# Patient Record
Sex: Male | Born: 1935 | Race: Black or African American | Hispanic: No | Marital: Married | Smoking: Former smoker
Health system: Southern US, Community
[De-identification: ages and names within clinical notes are randomized; demographics above are authoritative.]

## PROBLEM LIST (undated history)

## (undated) DIAGNOSIS — I471 Supraventricular tachycardia, unspecified: Secondary | ICD-10-CM

## (undated) DIAGNOSIS — I10 Essential (primary) hypertension: Secondary | ICD-10-CM

## (undated) DIAGNOSIS — I6529 Occlusion and stenosis of unspecified carotid artery: Secondary | ICD-10-CM

## (undated) DIAGNOSIS — C8330 Diffuse large B-cell lymphoma, unspecified site: Secondary | ICD-10-CM

## (undated) DIAGNOSIS — IMO0001 Reserved for inherently not codable concepts without codable children: Secondary | ICD-10-CM

## (undated) DIAGNOSIS — IMO0002 Reserved for concepts with insufficient information to code with codable children: Secondary | ICD-10-CM

## (undated) DIAGNOSIS — E0590 Thyrotoxicosis, unspecified without thyrotoxic crisis or storm: Secondary | ICD-10-CM

## (undated) DIAGNOSIS — C801 Malignant (primary) neoplasm, unspecified: Secondary | ICD-10-CM

## (undated) DIAGNOSIS — Z923 Personal history of irradiation: Secondary | ICD-10-CM

## (undated) DIAGNOSIS — Z9289 Personal history of other medical treatment: Secondary | ICD-10-CM

## (undated) HISTORY — DX: Diffuse large B-cell lymphoma, unspecified site: C83.30

## (undated) HISTORY — DX: Personal history of other medical treatment: Z92.89

## (undated) HISTORY — DX: Thyrotoxicosis, unspecified without thyrotoxic crisis or storm: E05.90

## (undated) HISTORY — DX: Reserved for concepts with insufficient information to code with codable children: IMO0002

## (undated) HISTORY — PX: OTHER SURGICAL HISTORY: SHX169

## (undated) HISTORY — DX: Reserved for inherently not codable concepts without codable children: IMO0001

## (undated) HISTORY — DX: Essential (primary) hypertension: I10

## (undated) HISTORY — DX: Supraventricular tachycardia, unspecified: I47.10

## (undated) HISTORY — DX: Occlusion and stenosis of unspecified carotid artery: I65.29

## (undated) HISTORY — DX: Supraventricular tachycardia: I47.1

---

## 1998-01-14 ENCOUNTER — Encounter: Admission: RE | Admit: 1998-01-14 | Discharge: 1998-01-14 | Payer: Self-pay | Admitting: Family Medicine

## 1998-01-28 ENCOUNTER — Encounter: Admission: RE | Admit: 1998-01-28 | Discharge: 1998-01-28 | Payer: Self-pay | Admitting: Family Medicine

## 1998-03-18 ENCOUNTER — Encounter: Admission: RE | Admit: 1998-03-18 | Discharge: 1998-03-18 | Payer: Self-pay | Admitting: Family Medicine

## 1998-04-08 ENCOUNTER — Encounter: Admission: RE | Admit: 1998-04-08 | Discharge: 1998-04-08 | Payer: Self-pay | Admitting: Family Medicine

## 1998-04-29 ENCOUNTER — Encounter: Admission: RE | Admit: 1998-04-29 | Discharge: 1998-04-29 | Payer: Self-pay | Admitting: Family Medicine

## 1998-07-17 ENCOUNTER — Encounter: Admission: RE | Admit: 1998-07-17 | Discharge: 1998-07-17 | Payer: Self-pay | Admitting: Family Medicine

## 1998-07-30 ENCOUNTER — Encounter: Admission: RE | Admit: 1998-07-30 | Discharge: 1998-10-28 | Payer: Self-pay | Admitting: Radiation Oncology

## 1998-08-05 ENCOUNTER — Encounter: Admission: RE | Admit: 1998-08-05 | Discharge: 1998-08-05 | Payer: Self-pay | Admitting: Family Medicine

## 1998-09-19 ENCOUNTER — Encounter: Admission: RE | Admit: 1998-09-19 | Discharge: 1998-09-19 | Payer: Self-pay | Admitting: Family Medicine

## 1998-10-03 ENCOUNTER — Inpatient Hospital Stay (HOSPITAL_COMMUNITY): Admission: AD | Admit: 1998-10-03 | Discharge: 1998-10-07 | Payer: Self-pay | Admitting: Sports Medicine

## 1998-10-03 ENCOUNTER — Encounter: Admission: RE | Admit: 1998-10-03 | Discharge: 1998-10-03 | Payer: Self-pay | Admitting: Sports Medicine

## 1998-10-05 ENCOUNTER — Encounter: Payer: Self-pay | Admitting: Sports Medicine

## 1998-10-14 ENCOUNTER — Encounter: Admission: RE | Admit: 1998-10-14 | Discharge: 1998-10-14 | Payer: Self-pay | Admitting: Family Medicine

## 1998-10-14 ENCOUNTER — Encounter: Payer: Self-pay | Admitting: Family Medicine

## 1998-10-14 ENCOUNTER — Emergency Department (HOSPITAL_COMMUNITY): Admission: EM | Admit: 1998-10-14 | Discharge: 1998-10-15 | Payer: Self-pay | Admitting: Emergency Medicine

## 1998-10-15 ENCOUNTER — Encounter: Payer: Self-pay | Admitting: Family Medicine

## 1998-10-18 ENCOUNTER — Encounter: Admission: RE | Admit: 1998-10-18 | Discharge: 1998-10-18 | Payer: Self-pay | Admitting: Family Medicine

## 1998-11-11 ENCOUNTER — Encounter: Admission: RE | Admit: 1998-11-11 | Discharge: 1998-11-11 | Payer: Self-pay | Admitting: Family Medicine

## 1999-02-07 ENCOUNTER — Encounter: Admission: RE | Admit: 1999-02-07 | Discharge: 1999-05-08 | Payer: Self-pay | Admitting: Radiation Oncology

## 1999-05-02 ENCOUNTER — Encounter: Admission: RE | Admit: 1999-05-02 | Discharge: 1999-05-02 | Payer: Self-pay | Admitting: Family Medicine

## 1999-06-02 ENCOUNTER — Encounter: Admission: RE | Admit: 1999-06-02 | Discharge: 1999-06-02 | Payer: Self-pay | Admitting: Family Medicine

## 1999-06-27 ENCOUNTER — Encounter: Payer: Self-pay | Admitting: *Deleted

## 1999-06-27 ENCOUNTER — Ambulatory Visit (HOSPITAL_BASED_OUTPATIENT_CLINIC_OR_DEPARTMENT_OTHER): Admission: RE | Admit: 1999-06-27 | Discharge: 1999-06-27 | Payer: Self-pay | Admitting: *Deleted

## 1999-07-09 ENCOUNTER — Encounter: Admission: RE | Admit: 1999-07-09 | Discharge: 1999-07-09 | Payer: Self-pay | Admitting: Family Medicine

## 1999-07-28 ENCOUNTER — Ambulatory Visit (HOSPITAL_COMMUNITY): Admission: RE | Admit: 1999-07-28 | Discharge: 1999-07-28 | Payer: Self-pay | Admitting: Radiation Oncology

## 1999-07-30 ENCOUNTER — Encounter: Admission: RE | Admit: 1999-07-30 | Discharge: 1999-10-28 | Payer: Self-pay | Admitting: Radiation Oncology

## 1999-09-17 ENCOUNTER — Ambulatory Visit (HOSPITAL_COMMUNITY): Admission: RE | Admit: 1999-09-17 | Discharge: 1999-09-17 | Payer: Self-pay | Admitting: Gastroenterology

## 1999-09-17 ENCOUNTER — Encounter (INDEPENDENT_AMBULATORY_CARE_PROVIDER_SITE_OTHER): Payer: Self-pay | Admitting: Specialist

## 1999-11-17 ENCOUNTER — Encounter: Admission: RE | Admit: 1999-11-17 | Discharge: 1999-11-17 | Payer: Self-pay | Admitting: Family Medicine

## 2000-10-22 ENCOUNTER — Encounter: Payer: Self-pay | Admitting: Emergency Medicine

## 2000-10-22 ENCOUNTER — Inpatient Hospital Stay (HOSPITAL_COMMUNITY): Admission: EM | Admit: 2000-10-22 | Discharge: 2000-10-23 | Payer: Self-pay | Admitting: Emergency Medicine

## 2000-10-25 ENCOUNTER — Ambulatory Visit (HOSPITAL_COMMUNITY): Admission: RE | Admit: 2000-10-25 | Discharge: 2000-10-25 | Payer: Self-pay | Admitting: Cardiology

## 2000-11-05 ENCOUNTER — Encounter: Admission: RE | Admit: 2000-11-05 | Discharge: 2000-11-05 | Payer: Self-pay | Admitting: Family Medicine

## 2000-11-22 ENCOUNTER — Ambulatory Visit (HOSPITAL_COMMUNITY): Admission: RE | Admit: 2000-11-22 | Discharge: 2000-11-22 | Payer: Self-pay | Admitting: Family Medicine

## 2000-11-22 ENCOUNTER — Encounter: Payer: Self-pay | Admitting: Family Medicine

## 2000-11-22 ENCOUNTER — Encounter: Admission: RE | Admit: 2000-11-22 | Discharge: 2000-11-22 | Payer: Self-pay | Admitting: Family Medicine

## 2000-11-22 ENCOUNTER — Encounter: Admission: RE | Admit: 2000-11-22 | Discharge: 2000-11-22 | Payer: Self-pay | Admitting: *Deleted

## 2000-12-06 ENCOUNTER — Encounter: Admission: RE | Admit: 2000-12-06 | Discharge: 2000-12-06 | Payer: Self-pay | Admitting: Family Medicine

## 2001-01-03 ENCOUNTER — Encounter: Admission: RE | Admit: 2001-01-03 | Discharge: 2001-01-03 | Payer: Self-pay | Admitting: Family Medicine

## 2001-01-24 ENCOUNTER — Encounter: Admission: RE | Admit: 2001-01-24 | Discharge: 2001-01-24 | Payer: Self-pay | Admitting: Family Medicine

## 2001-02-21 ENCOUNTER — Encounter: Admission: RE | Admit: 2001-02-21 | Discharge: 2001-02-21 | Payer: Self-pay | Admitting: Family Medicine

## 2001-03-02 ENCOUNTER — Encounter: Admission: RE | Admit: 2001-03-02 | Discharge: 2001-03-02 | Payer: Self-pay | Admitting: Family Medicine

## 2001-03-28 ENCOUNTER — Encounter: Admission: RE | Admit: 2001-03-28 | Discharge: 2001-03-28 | Payer: Self-pay | Admitting: Family Medicine

## 2001-04-11 ENCOUNTER — Encounter: Admission: RE | Admit: 2001-04-11 | Discharge: 2001-04-11 | Payer: Self-pay | Admitting: Family Medicine

## 2001-04-25 ENCOUNTER — Encounter: Admission: RE | Admit: 2001-04-25 | Discharge: 2001-04-25 | Payer: Self-pay | Admitting: Family Medicine

## 2001-05-18 ENCOUNTER — Encounter: Admission: RE | Admit: 2001-05-18 | Discharge: 2001-05-18 | Payer: Self-pay | Admitting: Family Medicine

## 2001-07-25 ENCOUNTER — Encounter: Admission: RE | Admit: 2001-07-25 | Discharge: 2001-07-25 | Payer: Self-pay | Admitting: Family Medicine

## 2001-11-14 ENCOUNTER — Encounter: Admission: RE | Admit: 2001-11-14 | Discharge: 2001-11-14 | Payer: Self-pay | Admitting: Family Medicine

## 2001-11-14 ENCOUNTER — Encounter: Payer: Self-pay | Admitting: Family Medicine

## 2002-01-02 ENCOUNTER — Encounter: Admission: RE | Admit: 2002-01-02 | Discharge: 2002-01-02 | Payer: Self-pay | Admitting: Family Medicine

## 2002-02-13 ENCOUNTER — Encounter: Admission: RE | Admit: 2002-02-13 | Discharge: 2002-05-12 | Payer: Self-pay | Admitting: Neurology

## 2002-07-11 ENCOUNTER — Encounter: Admission: RE | Admit: 2002-07-11 | Discharge: 2002-07-11 | Payer: Self-pay | Admitting: Family Medicine

## 2002-07-31 ENCOUNTER — Encounter: Admission: RE | Admit: 2002-07-31 | Discharge: 2002-07-31 | Payer: Self-pay | Admitting: Family Medicine

## 2003-02-19 ENCOUNTER — Encounter: Admission: RE | Admit: 2003-02-19 | Discharge: 2003-02-19 | Payer: Self-pay | Admitting: Family Medicine

## 2003-07-23 ENCOUNTER — Encounter: Admission: RE | Admit: 2003-07-23 | Discharge: 2003-07-23 | Payer: Self-pay | Admitting: Family Medicine

## 2003-10-22 ENCOUNTER — Ambulatory Visit (HOSPITAL_COMMUNITY): Admission: RE | Admit: 2003-10-22 | Discharge: 2003-10-22 | Payer: Self-pay | Admitting: Gastroenterology

## 2003-11-20 ENCOUNTER — Encounter: Admission: RE | Admit: 2003-11-20 | Discharge: 2003-11-20 | Payer: Self-pay | Admitting: Family Medicine

## 2003-12-31 ENCOUNTER — Encounter: Admission: RE | Admit: 2003-12-31 | Discharge: 2003-12-31 | Payer: Self-pay | Admitting: Family Medicine

## 2004-02-18 ENCOUNTER — Encounter: Admission: RE | Admit: 2004-02-18 | Discharge: 2004-02-18 | Payer: Self-pay | Admitting: Family Medicine

## 2004-02-21 ENCOUNTER — Encounter: Admission: RE | Admit: 2004-02-21 | Discharge: 2004-02-21 | Payer: Self-pay | Admitting: Sports Medicine

## 2004-02-25 ENCOUNTER — Encounter: Admission: RE | Admit: 2004-02-25 | Discharge: 2004-02-25 | Payer: Self-pay | Admitting: Family Medicine

## 2004-02-29 ENCOUNTER — Encounter: Admission: RE | Admit: 2004-02-29 | Discharge: 2004-02-29 | Payer: Self-pay | Admitting: Sports Medicine

## 2004-05-23 ENCOUNTER — Encounter: Admission: RE | Admit: 2004-05-23 | Discharge: 2004-05-23 | Payer: Self-pay | Admitting: Family Medicine

## 2004-06-02 ENCOUNTER — Encounter: Admission: RE | Admit: 2004-06-02 | Discharge: 2004-06-02 | Payer: Self-pay | Admitting: Family Medicine

## 2004-06-02 ENCOUNTER — Ambulatory Visit (HOSPITAL_COMMUNITY): Admission: RE | Admit: 2004-06-02 | Discharge: 2004-06-02 | Payer: Self-pay | Admitting: Family Medicine

## 2004-07-14 ENCOUNTER — Ambulatory Visit: Payer: Self-pay | Admitting: Family Medicine

## 2004-07-30 ENCOUNTER — Ambulatory Visit: Payer: Self-pay | Admitting: Family Medicine

## 2004-08-18 ENCOUNTER — Ambulatory Visit: Payer: Self-pay | Admitting: Family Medicine

## 2004-09-15 ENCOUNTER — Ambulatory Visit: Payer: Self-pay | Admitting: Family Medicine

## 2004-09-15 ENCOUNTER — Encounter: Admission: RE | Admit: 2004-09-15 | Discharge: 2004-09-15 | Payer: Self-pay | Admitting: Family Medicine

## 2005-01-26 ENCOUNTER — Ambulatory Visit (HOSPITAL_COMMUNITY): Admission: RE | Admit: 2005-01-26 | Discharge: 2005-01-26 | Payer: Self-pay | Admitting: Family Medicine

## 2005-01-26 ENCOUNTER — Ambulatory Visit: Payer: Self-pay | Admitting: Sports Medicine

## 2005-02-05 ENCOUNTER — Ambulatory Visit: Payer: Self-pay | Admitting: Sports Medicine

## 2005-02-11 ENCOUNTER — Ambulatory Visit (HOSPITAL_COMMUNITY): Admission: RE | Admit: 2005-02-11 | Discharge: 2005-02-11 | Payer: Self-pay | Admitting: Family Medicine

## 2005-02-11 ENCOUNTER — Encounter: Payer: Self-pay | Admitting: Cardiology

## 2005-02-11 ENCOUNTER — Ambulatory Visit: Payer: Self-pay | Admitting: Cardiology

## 2005-02-13 ENCOUNTER — Ambulatory Visit: Payer: Self-pay | Admitting: Family Medicine

## 2005-02-23 ENCOUNTER — Encounter: Admission: RE | Admit: 2005-02-23 | Discharge: 2005-05-24 | Payer: Self-pay | Admitting: Family Medicine

## 2005-06-08 ENCOUNTER — Ambulatory Visit: Payer: Self-pay | Admitting: Family Medicine

## 2005-10-15 ENCOUNTER — Ambulatory Visit: Payer: Self-pay | Admitting: Family Medicine

## 2005-11-12 DIAGNOSIS — I6529 Occlusion and stenosis of unspecified carotid artery: Secondary | ICD-10-CM

## 2005-11-12 HISTORY — DX: Occlusion and stenosis of unspecified carotid artery: I65.29

## 2005-11-30 ENCOUNTER — Ambulatory Visit: Payer: Self-pay | Admitting: Family Medicine

## 2006-01-25 ENCOUNTER — Ambulatory Visit: Payer: Self-pay | Admitting: Family Medicine

## 2006-02-24 ENCOUNTER — Ambulatory Visit (HOSPITAL_COMMUNITY): Admission: RE | Admit: 2006-02-24 | Discharge: 2006-02-24 | Payer: Self-pay | Admitting: Sports Medicine

## 2006-02-24 DIAGNOSIS — Z9289 Personal history of other medical treatment: Secondary | ICD-10-CM

## 2006-02-24 HISTORY — DX: Personal history of other medical treatment: Z92.89

## 2006-03-15 ENCOUNTER — Ambulatory Visit: Payer: Self-pay | Admitting: Family Medicine

## 2006-04-07 ENCOUNTER — Ambulatory Visit: Payer: Self-pay | Admitting: Family Medicine

## 2006-05-03 ENCOUNTER — Ambulatory Visit: Payer: Self-pay | Admitting: Family Medicine

## 2006-06-28 ENCOUNTER — Ambulatory Visit: Payer: Self-pay | Admitting: Family Medicine

## 2006-08-04 ENCOUNTER — Ambulatory Visit: Payer: Self-pay | Admitting: Family Medicine

## 2006-09-27 ENCOUNTER — Ambulatory Visit: Payer: Self-pay | Admitting: Family Medicine

## 2006-11-01 ENCOUNTER — Ambulatory Visit: Payer: Self-pay | Admitting: Family Medicine

## 2006-12-09 DIAGNOSIS — N200 Calculus of kidney: Secondary | ICD-10-CM | POA: Insufficient documentation

## 2006-12-09 DIAGNOSIS — I739 Peripheral vascular disease, unspecified: Secondary | ICD-10-CM

## 2006-12-09 DIAGNOSIS — C61 Malignant neoplasm of prostate: Secondary | ICD-10-CM

## 2006-12-09 DIAGNOSIS — E7800 Pure hypercholesterolemia, unspecified: Secondary | ICD-10-CM

## 2006-12-09 DIAGNOSIS — I10 Essential (primary) hypertension: Secondary | ICD-10-CM

## 2006-12-09 DIAGNOSIS — E1151 Type 2 diabetes mellitus with diabetic peripheral angiopathy without gangrene: Secondary | ICD-10-CM | POA: Insufficient documentation

## 2006-12-09 DIAGNOSIS — E039 Hypothyroidism, unspecified: Secondary | ICD-10-CM | POA: Insufficient documentation

## 2006-12-09 DIAGNOSIS — H9190 Unspecified hearing loss, unspecified ear: Secondary | ICD-10-CM | POA: Insufficient documentation

## 2006-12-09 DIAGNOSIS — M5382 Other specified dorsopathies, cervical region: Secondary | ICD-10-CM | POA: Insufficient documentation

## 2006-12-09 DIAGNOSIS — I471 Supraventricular tachycardia: Secondary | ICD-10-CM

## 2006-12-09 DIAGNOSIS — F411 Generalized anxiety disorder: Secondary | ICD-10-CM | POA: Insufficient documentation

## 2006-12-15 ENCOUNTER — Telehealth: Payer: Self-pay | Admitting: Family Medicine

## 2006-12-27 ENCOUNTER — Ambulatory Visit: Payer: Self-pay | Admitting: Family Medicine

## 2006-12-27 LAB — CONVERTED CEMR LAB: Hgb A1c MFr Bld: 7.7 %

## 2007-02-03 ENCOUNTER — Encounter: Payer: Self-pay | Admitting: Family Medicine

## 2007-02-28 ENCOUNTER — Encounter: Payer: Self-pay | Admitting: *Deleted

## 2007-03-28 ENCOUNTER — Encounter: Payer: Self-pay | Admitting: Family Medicine

## 2007-04-25 ENCOUNTER — Encounter: Payer: Self-pay | Admitting: Family Medicine

## 2007-05-16 ENCOUNTER — Ambulatory Visit: Payer: Self-pay | Admitting: Family Medicine

## 2007-05-16 LAB — CONVERTED CEMR LAB: Hgb A1c MFr Bld: 7.8 %

## 2007-05-18 LAB — CONVERTED CEMR LAB
BUN: 17 mg/dL (ref 6–23)
CO2: 25 meq/L (ref 19–32)
Calcium: 9.8 mg/dL (ref 8.4–10.5)
Chloride: 98 meq/L (ref 96–112)
Cholesterol: 260 mg/dL — ABNORMAL HIGH (ref 0–200)
Creatinine, Ser: 1.01 mg/dL (ref 0.40–1.50)
Glucose, Bld: 86 mg/dL (ref 70–99)
HDL: 51 mg/dL (ref >39)
LDL Cholesterol: 171 mg/dL — ABNORMAL HIGH (ref 0–99)
Potassium: 3.6 meq/L (ref 3.5–5.3)
Sodium: 138 meq/L (ref 135–145)
TSH: 6.694 microintl units/mL — ABNORMAL HIGH (ref 0.350–5.50)
Total CHOL/HDL Ratio: 5.1
Triglycerides: 189 mg/dL — ABNORMAL HIGH (ref <150)
VLDL: 38 mg/dL (ref 0–40)

## 2007-08-08 ENCOUNTER — Ambulatory Visit: Payer: Self-pay | Admitting: Family Medicine

## 2007-08-23 ENCOUNTER — Encounter: Payer: Self-pay | Admitting: Family Medicine

## 2007-11-14 ENCOUNTER — Ambulatory Visit: Payer: Self-pay | Admitting: Family Medicine

## 2007-11-14 LAB — CONVERTED CEMR LAB: Hgb A1c MFr Bld: 8 %

## 2007-11-18 LAB — CONVERTED CEMR LAB
LDH: 151 units/L (ref 94–250)
TSH: 2.727 microintl units/mL (ref 0.350–5.50)

## 2007-12-26 ENCOUNTER — Ambulatory Visit: Payer: Self-pay | Admitting: Family Medicine

## 2008-01-30 ENCOUNTER — Telehealth (INDEPENDENT_AMBULATORY_CARE_PROVIDER_SITE_OTHER): Payer: Self-pay | Admitting: *Deleted

## 2008-02-01 ENCOUNTER — Encounter: Payer: Self-pay | Admitting: Family Medicine

## 2008-02-01 ENCOUNTER — Ambulatory Visit: Payer: Self-pay | Admitting: Family Medicine

## 2008-02-03 ENCOUNTER — Telehealth: Payer: Self-pay | Admitting: Family Medicine

## 2008-02-03 LAB — CONVERTED CEMR LAB
ALT: 27 units/L (ref 0–53)
AST: 26 units/L (ref 0–37)
Albumin: 4.5 g/dL (ref 3.5–5.2)
Alkaline Phosphatase: 88 units/L (ref 39–117)
BUN: 22 mg/dL (ref 6–23)
CO2: 25 meq/L (ref 19–32)
Calcium: 9.2 mg/dL (ref 8.4–10.5)
Chloride: 100 meq/L (ref 96–112)
Creatinine, Ser: 1.04 mg/dL (ref 0.40–1.50)
Direct LDL: 143 mg/dL — ABNORMAL HIGH
Glucose, Bld: 185 mg/dL — ABNORMAL HIGH (ref 70–99)
Potassium: 3.4 meq/L — ABNORMAL LOW (ref 3.5–5.3)
Sodium: 139 meq/L (ref 135–145)
Total Bilirubin: 0.6 mg/dL (ref 0.3–1.2)
Total Protein: 7.7 g/dL (ref 6.0–8.3)
Vit D, 1,25-Dihydroxy: 24 — ABNORMAL LOW (ref 30–89)

## 2008-02-06 ENCOUNTER — Ambulatory Visit: Payer: Self-pay | Admitting: Family Medicine

## 2008-02-06 DIAGNOSIS — J309 Allergic rhinitis, unspecified: Secondary | ICD-10-CM | POA: Insufficient documentation

## 2008-05-16 ENCOUNTER — Encounter: Payer: Self-pay | Admitting: *Deleted

## 2008-06-11 ENCOUNTER — Ambulatory Visit: Payer: Self-pay | Admitting: Family Medicine

## 2008-06-11 LAB — CONVERTED CEMR LAB: Hgb A1c MFr Bld: 7.1 %

## 2008-07-30 ENCOUNTER — Encounter: Payer: Self-pay | Admitting: *Deleted

## 2008-09-21 ENCOUNTER — Ambulatory Visit: Payer: Self-pay | Admitting: Family Medicine

## 2008-09-21 LAB — CONVERTED CEMR LAB
BUN: 24 mg/dL — ABNORMAL HIGH (ref 6–23)
CO2: 28 meq/L (ref 19–32)
Calcium: 9.6 mg/dL (ref 8.4–10.5)
Chloride: 100 meq/L (ref 96–112)
Creatinine, Ser: 1.02 mg/dL (ref 0.40–1.50)
Direct LDL: 145 mg/dL — ABNORMAL HIGH
Glucose, Bld: 94 mg/dL (ref 70–99)
Hgb A1c MFr Bld: 6.6 %
Potassium: 3.6 meq/L (ref 3.5–5.3)
Sodium: 139 meq/L (ref 135–145)

## 2008-10-10 ENCOUNTER — Telehealth: Payer: Self-pay | Admitting: *Deleted

## 2008-10-12 HISTORY — PX: CARDIAC CATHETERIZATION: SHX172

## 2008-11-07 ENCOUNTER — Telehealth: Payer: Self-pay | Admitting: Family Medicine

## 2008-12-06 ENCOUNTER — Encounter: Payer: Self-pay | Admitting: Family Medicine

## 2008-12-31 ENCOUNTER — Telehealth: Payer: Self-pay | Admitting: Family Medicine

## 2008-12-31 ENCOUNTER — Ambulatory Visit: Payer: Self-pay | Admitting: Family Medicine

## 2009-01-04 ENCOUNTER — Ambulatory Visit: Payer: Self-pay | Admitting: Family Medicine

## 2009-01-04 ENCOUNTER — Encounter: Admission: RE | Admit: 2009-01-04 | Discharge: 2009-01-04 | Payer: Self-pay | Admitting: Family Medicine

## 2009-01-04 DIAGNOSIS — M949 Disorder of cartilage, unspecified: Secondary | ICD-10-CM

## 2009-01-04 DIAGNOSIS — M899 Disorder of bone, unspecified: Secondary | ICD-10-CM | POA: Insufficient documentation

## 2009-01-04 LAB — CONVERTED CEMR LAB
ALT: 18 units/L (ref 0–53)
AST: 27 units/L (ref 0–37)
Albumin: 4 g/dL (ref 3.5–5.2)
Alkaline Phosphatase: 74 units/L (ref 39–117)
BUN: 16 mg/dL (ref 6–23)
CO2: 21 meq/L (ref 19–32)
Calcium: 8.8 mg/dL (ref 8.4–10.5)
Chloride: 101 meq/L (ref 96–112)
Creatinine, Ser: 1.28 mg/dL (ref 0.40–1.50)
Direct LDL: 127 mg/dL — ABNORMAL HIGH
Glucose, Bld: 151 mg/dL — ABNORMAL HIGH (ref 70–99)
Hgb A1c MFr Bld: 6.4 %
Potassium: 3.6 meq/L (ref 3.5–5.3)
Sodium: 138 meq/L (ref 135–145)
Total Bilirubin: 0.5 mg/dL (ref 0.3–1.2)
Total Protein: 7.7 g/dL (ref 6.0–8.3)
Vit D, 25-Hydroxy: 17 ng/mL — ABNORMAL LOW (ref 30–89)

## 2009-01-14 ENCOUNTER — Encounter: Payer: Self-pay | Admitting: Family Medicine

## 2009-03-17 ENCOUNTER — Inpatient Hospital Stay: Payer: Self-pay | Admitting: Internal Medicine

## 2009-03-28 ENCOUNTER — Encounter: Payer: Self-pay | Admitting: Family Medicine

## 2009-05-09 ENCOUNTER — Encounter: Payer: Self-pay | Admitting: Family Medicine

## 2009-05-09 ENCOUNTER — Ambulatory Visit: Payer: Self-pay | Admitting: Family Medicine

## 2009-05-09 DIAGNOSIS — H8110 Benign paroxysmal vertigo, unspecified ear: Secondary | ICD-10-CM

## 2009-05-09 LAB — CONVERTED CEMR LAB: Hgb A1c MFr Bld: 6.6 %

## 2009-05-14 ENCOUNTER — Encounter: Payer: Self-pay | Admitting: Family Medicine

## 2009-05-15 LAB — CONVERTED CEMR LAB
BUN: 18 mg/dL (ref 6–23)
Basophils Absolute: 0 10*3/uL (ref 0.0–0.1)
Basophils Relative: 1 % (ref 0–1)
CO2: 27 meq/L (ref 19–32)
Calcium: 9.2 mg/dL (ref 8.4–10.5)
Chloride: 100 meq/L (ref 96–112)
Creatinine, Ser: 1.04 mg/dL (ref 0.40–1.50)
Eosinophils Absolute: 0 10*3/uL (ref 0.0–0.7)
Eosinophils Relative: 1 % (ref 0–5)
Glucose, Bld: 117 mg/dL — ABNORMAL HIGH (ref 70–99)
HCT: 43.2 % (ref 39.0–52.0)
Hemoglobin: 14.5 g/dL (ref 13.0–17.0)
Lymphocytes Relative: 31 % (ref 12–46)
Lymphs Abs: 2 10*3/uL (ref 0.7–4.0)
MCHC: 33.6 g/dL (ref 30.0–36.0)
MCV: 93.9 fL (ref 78.0–100.0)
Monocytes Absolute: 0.3 10*3/uL (ref 0.1–1.0)
Monocytes Relative: 5 % (ref 3–12)
Neutro Abs: 4.2 10*3/uL (ref 1.7–7.7)
Neutrophils Relative %: 64 % (ref 43–77)
Platelets: 192 10*3/uL (ref 150–400)
Potassium: 3.9 meq/L (ref 3.5–5.3)
RBC: 4.6 M/uL (ref 4.22–5.81)
RDW: 14.8 % (ref 11.5–15.5)
Sodium: 139 meq/L (ref 135–145)
TSH: 2.192 microintl units/mL (ref 0.350–4.500)
WBC: 6.6 10*3/uL (ref 4.0–10.5)

## 2009-05-17 ENCOUNTER — Encounter: Payer: Self-pay | Admitting: Family Medicine

## 2009-05-22 ENCOUNTER — Ambulatory Visit: Payer: Self-pay | Admitting: Family Medicine

## 2009-05-28 ENCOUNTER — Telehealth (INDEPENDENT_AMBULATORY_CARE_PROVIDER_SITE_OTHER): Payer: Self-pay | Admitting: *Deleted

## 2009-06-07 ENCOUNTER — Telehealth (INDEPENDENT_AMBULATORY_CARE_PROVIDER_SITE_OTHER): Payer: Self-pay | Admitting: *Deleted

## 2009-06-14 ENCOUNTER — Ambulatory Visit: Payer: Self-pay | Admitting: Family Medicine

## 2009-06-14 DIAGNOSIS — G252 Other specified forms of tremor: Secondary | ICD-10-CM

## 2009-06-14 DIAGNOSIS — G250 Essential tremor: Secondary | ICD-10-CM

## 2009-07-11 ENCOUNTER — Telehealth: Payer: Self-pay | Admitting: Family Medicine

## 2009-07-31 ENCOUNTER — Ambulatory Visit: Payer: Self-pay | Admitting: Family Medicine

## 2009-07-31 LAB — CONVERTED CEMR LAB: Hgb A1c MFr Bld: 6.7 %

## 2009-08-07 ENCOUNTER — Encounter: Payer: Self-pay | Admitting: Family Medicine

## 2009-11-27 ENCOUNTER — Telehealth: Payer: Self-pay | Admitting: Family Medicine

## 2009-12-18 ENCOUNTER — Ambulatory Visit: Payer: Self-pay | Admitting: Family Medicine

## 2009-12-18 LAB — CONVERTED CEMR LAB: Hgb A1c MFr Bld: 7.6 %

## 2009-12-20 LAB — CONVERTED CEMR LAB
ALT: 20 units/L (ref 0–53)
AST: 23 units/L (ref 0–37)
Albumin: 4.2 g/dL (ref 3.5–5.2)
Alkaline Phosphatase: 74 units/L (ref 39–117)
BUN: 18 mg/dL (ref 6–23)
CO2: 25 meq/L (ref 19–32)
Calcium: 9.5 mg/dL (ref 8.4–10.5)
Chloride: 97 meq/L (ref 96–112)
Cholesterol: 219 mg/dL — ABNORMAL HIGH (ref 0–200)
Creatinine, Ser: 0.88 mg/dL (ref 0.40–1.50)
Glucose, Bld: 88 mg/dL (ref 70–99)
HDL: 49 mg/dL (ref >39)
LDL Cholesterol: 150 mg/dL — ABNORMAL HIGH (ref 0–99)
Potassium: 3.4 meq/L — ABNORMAL LOW (ref 3.5–5.3)
Sodium: 136 meq/L (ref 135–145)
Total Bilirubin: 0.4 mg/dL (ref 0.3–1.2)
Total CHOL/HDL Ratio: 4.5
Total Protein: 7.3 g/dL (ref 6.0–8.3)
Triglycerides: 99 mg/dL (ref <150)
VLDL: 20 mg/dL (ref 0–40)

## 2010-01-28 ENCOUNTER — Ambulatory Visit: Payer: Self-pay | Admitting: Family Medicine

## 2010-02-19 ENCOUNTER — Encounter: Admission: RE | Admit: 2010-02-19 | Discharge: 2010-02-19 | Payer: Self-pay | Admitting: Family Medicine

## 2010-02-19 ENCOUNTER — Ambulatory Visit: Payer: Self-pay | Admitting: Family Medicine

## 2010-02-26 ENCOUNTER — Ambulatory Visit: Payer: Self-pay | Admitting: Family Medicine

## 2010-02-26 LAB — CONVERTED CEMR LAB: Pro B Natriuretic peptide (BNP): 19.4 pg/mL (ref 0.0–100.0)

## 2010-03-05 ENCOUNTER — Ambulatory Visit: Payer: Self-pay | Admitting: Internal Medicine

## 2010-03-05 ENCOUNTER — Ambulatory Visit (HOSPITAL_COMMUNITY): Admission: RE | Admit: 2010-03-05 | Discharge: 2010-03-05 | Payer: Self-pay | Admitting: Family Medicine

## 2010-03-05 ENCOUNTER — Encounter: Payer: Self-pay | Admitting: Family Medicine

## 2010-05-14 ENCOUNTER — Ambulatory Visit: Payer: Self-pay | Admitting: Family Medicine

## 2010-05-14 LAB — CONVERTED CEMR LAB: Hgb A1c MFr Bld: 8 %

## 2010-06-05 ENCOUNTER — Telehealth: Payer: Self-pay | Admitting: Family Medicine

## 2010-06-24 ENCOUNTER — Encounter: Payer: Self-pay | Admitting: Family Medicine

## 2010-06-24 LAB — CONVERTED CEMR LAB
ALT: 20 units/L
AST: 21 units/L
Albumin: 4.2 g/dL
Alkaline Phosphatase: 75 units/L
BUN: 16 mg/dL
CO2: 26 meq/L
Calcium: 9.4 mg/dL
Chloride: 96 meq/L
Creatinine, Ser: 0.95 mg/dL
Glucose, Bld: 136 mg/dL
HCT: 40.2 %
Hemoglobin: 13.6 g/dL
MCHC: 33.8 g/dL
MCV: 91.6 fL
Platelets: 163 10*3/uL
Potassium: 4 meq/L
RBC: 4.39 M/uL
RDW: 14.6 %
Sodium: 136 meq/L
Total Bilirubin: 0.5 mg/dL
Total Protein: 7.7 g/dL
WBC: 7.2 10*3/uL

## 2010-07-11 ENCOUNTER — Telehealth: Payer: Self-pay | Admitting: Family Medicine

## 2010-07-23 ENCOUNTER — Ambulatory Visit: Payer: Self-pay | Admitting: Family Medicine

## 2010-07-23 DIAGNOSIS — B356 Tinea cruris: Secondary | ICD-10-CM

## 2010-07-24 ENCOUNTER — Encounter: Admission: RE | Admit: 2010-07-24 | Discharge: 2010-07-24 | Payer: Self-pay | Admitting: Family Medicine

## 2010-07-24 ENCOUNTER — Telehealth: Payer: Self-pay | Admitting: *Deleted

## 2010-07-28 ENCOUNTER — Encounter: Payer: Self-pay | Admitting: Family Medicine

## 2010-07-29 ENCOUNTER — Ambulatory Visit (HOSPITAL_COMMUNITY): Admission: RE | Admit: 2010-07-29 | Discharge: 2010-07-29 | Payer: Self-pay | Admitting: Family Medicine

## 2010-07-29 ENCOUNTER — Telehealth: Payer: Self-pay | Admitting: Family Medicine

## 2010-08-13 ENCOUNTER — Ambulatory Visit: Payer: Self-pay | Admitting: Family Medicine

## 2010-08-13 LAB — CONVERTED CEMR LAB: Hgb A1c MFr Bld: 7.8 %

## 2010-08-14 ENCOUNTER — Telehealth: Payer: Self-pay | Admitting: Family Medicine

## 2010-08-29 ENCOUNTER — Telehealth: Payer: Self-pay | Admitting: Family Medicine

## 2010-09-22 ENCOUNTER — Ambulatory Visit: Payer: Self-pay | Admitting: Cardiovascular Disease

## 2010-09-24 ENCOUNTER — Telehealth: Payer: Self-pay | Admitting: Family Medicine

## 2010-09-25 ENCOUNTER — Telehealth: Payer: Self-pay | Admitting: Family Medicine

## 2010-09-29 ENCOUNTER — Telehealth: Payer: Self-pay | Admitting: Family Medicine

## 2010-10-08 ENCOUNTER — Telehealth: Payer: Self-pay | Admitting: Family Medicine

## 2010-10-12 DIAGNOSIS — C8330 Diffuse large B-cell lymphoma, unspecified site: Secondary | ICD-10-CM

## 2010-10-12 HISTORY — DX: Diffuse large B-cell lymphoma, unspecified site: C83.30

## 2010-10-17 ENCOUNTER — Ambulatory Visit: Admit: 2010-10-17 | Payer: Self-pay | Admitting: Family Medicine

## 2010-11-10 ENCOUNTER — Encounter: Payer: Self-pay | Admitting: Family Medicine

## 2010-11-10 ENCOUNTER — Ambulatory Visit: Payer: Self-pay | Admitting: Oncology

## 2010-11-11 NOTE — Miscellaneous (Signed)
Summary: Outside labs  Clinical Lists Changes  Observations: Added new observation of PLATELETK/UL: 163 K/uL (06/24/2010 15:39) Added new observation of RDW: 14.6 % (06/24/2010 15:39) Added new observation of MCHC RBC: 33.8 g/dL (16/07/9603 54:09) Added new observation of MCV: 91.6 fL (06/24/2010 15:39) Added new observation of HCT: 40.2 % (06/24/2010 15:39) Added new observation of HGB: 13.6 g/dL (81/19/1478 29:56) Added new observation of RBC M/UL: 4.39 M/uL (06/24/2010 15:39) Added new observation of WBC COUNT: 7.2 10*3/microliter (06/24/2010 15:39) Added new observation of CALCIUM: 9.4 mg/dL (21/30/8657 84:69) Added new observation of ALBUMIN: 4.2 g/dL (62/95/2841 32:44) Added new observation of PROTEIN, TOT: 7.7 g/dL (10/14/7251 66:44) Added new observation of SGPT (ALT): 20 units/L (06/24/2010 15:39) Added new observation of SGOT (AST): 21 units/L (06/24/2010 15:39) Added new observation of ALK PHOS: 75 units/L (06/24/2010 15:39) Added new observation of BILI TOTAL: 0.5 mg/dL (03/47/4259 56:38) Added new observation of CREATININE: 0.95 mg/dL (75/64/3329 51:88) Added new observation of BUN: 16 mg/dL (41/66/0630 16:01) Added new observation of BG RANDOM: 136 mg/dL (09/32/3557 32:20) Added new observation of CO2 PLSM/SER: 26 meq/L (06/24/2010 15:39) Added new observation of CL SERUM: 96 meq/L (06/24/2010 15:39) Added new observation of K SERUM: 4.0 meq/L (06/24/2010 15:39) Added new observation of NA: 136 meq/L (06/24/2010 15:39)

## 2010-11-11 NOTE — Assessment & Plan Note (Signed)
Summary: not better,df   Vital Signs:  Patient profile:   77 year old male Height:      71.5 inches Weight:      222.4 pounds BMI:     30.70 Temp:     97.8 degrees F oral Pulse rate:   65 / minute BP sitting:   145 / 75  (left arm) Cuff size:   regular  Vitals Entered By: Gladstone Pih (Feb 26, 2010 2:00 PM) CC: C/O not much better, inhalor makes him fell worse Is Patient Diabetic? Yes Did you bring your meter with you today? No Pain Assessment Patient in pain? no        Primary Care Provider:  Doralee Albino MD  CC:  C/O not much better and inhalor makes him fell worse.  History of Present Illness: FU dyspnea.  Some better today.  Was concerned about slow improvement.  Reviewed Hx.  Cough, wheeze and dyspnea on exertion.  Note weight gain over past several months.  C/o leg swelling during day.  Had cardiac work up about 1 year ago.  Has not seen cards recently  Habits & Providers  Alcohol-Tobacco-Diet     Tobacco Status: never  Current Medications (verified): 1)  Aspirin 81 Mg  Tbec (Aspirin) .... One Daily 2)  Cozaar 100 Mg Tabs (Losartan Potassium) .... Take 1 Tablet By Mouth Once A Day 3)  Hydrochlorothiazide 25 Mg Tabs (Hydrochlorothiazide) .... Take 1 Tablet By Mouth Once A Day 4)  Lorazepam 0.5 Mg Tabs (Lorazepam) .Marland Kitchen.. 1 Tablet By Mouth Twice A Day 5)  Metformin Hcl 1000 Mg Tabs (Metformin Hcl) .... Take 1 Tablet By Mouth Twice A Day 6)  Synthroid 50 Mcg  Tabs (Levothyroxine Sodium) .... One By Mouth Daily 7)  Triamcinolone Acetonide 0.1 % Oint (Triamcinolone Acetonide) .... Apply 1 A Small Amount To Skin Twice A Day.  Disp 60 Gram Tube 8)  Amlodipine Besylate 10 Mg  Tabs (Amlodipine Besylate) .... Once Daily 9)  Propranolol Hcl 10 Mg Tabs (Propranolol Hcl) .... One By Mouth Three Times A Day (Help For High Blood Pressure and For Tremor) 10)  Proair Hfa 108 (90 Base) Mcg/act Aers (Albuterol Sulfate) .... Two Puffs Four Times Per Day As Needed Wheezing. 11)   Furosemide 20 Mg Tabs (Furosemide) .... Take 1 Tab By Mouth Every Day  Allergies (verified): 1)  Tetracycline Hcl (Tetracycline Hcl) 2)  Amoxicillin (Amoxicillin) 3)  Erythromycin Ethylsuccinate 4)  Sulfamethoxazole (Sulfamethoxazole) 5)  Simvastatin 6)  Zetia (Ezetimibe)  Physical Exam  General:  Well-developed,well-nourished,in no acute distress; alert,appropriate and cooperative throughout examination Neck:  No JVD Lungs:  bibasilar crackles.  Wheeze exp mild right mid lung. Heart:  1/6 SEM no gallop. Extremities:  2+ bilateral edema   Impression & Recommendations:  Problem # 1:  DYSPNEA ON EXERTION (ICD-786.09) While I still think pulm (allergy versus infection) is likely, will investigate cardiac source.  Will call with results. His updated medication list for this problem includes:    Hydrochlorothiazide 25 Mg Tabs (Hydrochlorothiazide) .Marland Kitchen... Take 1 tablet by mouth once a day    Propranolol Hcl 10 Mg Tabs (Propranolol hcl) ..... One by mouth three times a day (help for high blood pressure and for tremor)    Proair Hfa 108 (90 Base) Mcg/act Aers (Albuterol sulfate) .Marland Kitchen..Marland Kitchen Two puffs four times per day as needed wheezing.    Furosemide 20 Mg Tabs (Furosemide) .Marland Kitchen... Take 1 tab by mouth every day  Orders: [facility] [phone]) 2 D Echo (  2 D Echo) [facility]- Est  Level 4 (41324)  Complete Medication List: 1)  Aspirin 81 Mg Tbec (Aspirin) .... One daily 2)  Cozaar 100 Mg Tabs (Losartan potassium) .... Take 1 tablet by mouth once a day 3)  Hydrochlorothiazide 25 Mg Tabs (Hydrochlorothiazide) .... Take 1 tablet by mouth once a day 4)  Lorazepam 0.5 Mg Tabs (Lorazepam) .Marland Kitchen.. 1 tablet by mouth twice a day 5)  Metformin Hcl 1000 Mg Tabs (Metformin hcl) .... Take 1 tablet by mouth twice a day 6)  Synthroid 50 Mcg Tabs (Levothyroxine sodium) .... One by mouth daily 7)  Triamcinolone Acetonide 0.1 % Oint (Triamcinolone acetonide) .... Apply 1 a small amount to skin twice a day.   disp 60 gram tube 8)  Amlodipine Besylate 10 Mg Tabs (Amlodipine besylate) .... Once daily 9)  Propranolol Hcl 10 Mg Tabs (Propranolol hcl) .... One by mouth three times a day (help for high blood pressure and for tremor) 10)  Proair Hfa 108 (90 Base) Mcg/act Aers (Albuterol sulfate) .... Two puffs four times per day as needed wheezing. 11)  Furosemide 20 Mg Tabs (Furosemide) .... Take 1 tab by mouth every day Prescriptions: FUROSEMIDE 20 MG TABS (FUROSEMIDE) Take 1 tab by mouth every day  #90 x 3   Entered and Authorized by:   Doralee Albino MD   Signed by:   Doralee Albino MD on 02/26/2010   Method used:   Electron[facility] to        [facility]  [address] [id number] ([facility]ail[facility]       [address]       [place], [place]  [address]       Ph: [phone] or [phone]       Fax: [phone]   RxID:   [id number]    Prevention & Chronic Care Immunizations   Influenza vaccine: Fluvax MCR  (07/31/2009)   Influenza vaccine due: 06/13/2011    Tetanus booster: 09/21/2008: given   Tetanus booster due: 09/21/2018    Pneumococcal vaccine: Pneumovax  (06/14/2009)   Pneumococcal vaccine due: None    H. zoster vaccine: Not documented  Colorectal Screening   Hemoccult: Done.  (05/13/2003)   Hemoccult due: Not Indicated    Colonoscopy: normal  (01/14/2009)   Colonoscopy due: 01/15/2019  Other Screening   PSA: sees [facility]  (09/21/2008)   PSA due due: Not Indicated   Smoking status: never  (02/26/2010)  Diabetes Mellitus   HgbA1C: 7.6  (12/18/2009)   Hemoglobin A1C due: 02/11/2008    Eye exam: normal  (08/21/2008)   Eye exam due: 08/2009    Foot exam: yes  (06/14/2009)   High risk foot: Not documented   Foot care education: Not documented    Urine microalbumin/creatinine ratio: Not documented   Urine microalbumin action/deferral: Not indicated    Diabetes flowsheet reviewed?: Yes   Progress toward A1C goal: Unchanged  Lipids   Total Cholesterol: 219  (12/18/2009)   Lipid  panel action/deferral: Lipid Panel ordered   LDL: 150  (12/18/2009)   LDL Direct: 127  (01/04/2009)   HDL: 49  (12/18/2009)   Triglycerides: 99  (12/18/2009)    SGOT (AST): 23  (12/18/2009)   SGPT (ALT): 20  (12/18/2009)   Alkaline phosphatase: 74  (12/18/2009)   Total bilirubin: 0.4  (12/18/2009)    Lipid flowsheet reviewed?: Yes   Progress toward LDL goal: Unchanged  Hypertension   Last Blood Pressure: 145 / 75  (02/26/2010)   Serum creatinine: 0.88  (12/18/2009)   Serum  potassium 3.4  (12/18/2009)    Hypertension flowsheet reviewed?: Yes   Progress toward BP goal: Unchanged  Self-Management Support :   Personal Goals (by the next clinic visit) :     Personal A1C goal: 7  (06/14/2009)     Personal blood pressure goal: 130/80  (06/14/2009)     Personal LDL goal: 100  (06/14/2009)    Diabetes self-management support: Written self-care plan  (02/19/2010)    Hypertension self-management support: Written self-care plan  (02/19/2010)    Lipid self-management support: Written self-care plan  (02/19/2010)

## 2010-11-11 NOTE — Assessment & Plan Note (Signed)
Summary: infected splinter,df   Vital Signs:  Patient profile:   77 year old male Height:      71.5 inches Weight:      218.5 pounds Temp:     98.1 degrees F oral Pulse rate:   65 / minute BP sitting:   143 / 93  Vitals Entered By: [place], SMA CC: Pt is wondering if he can have a Tdap after having a splinter in Right thumb. Pain Assessment Patient in pain? yes     Location: Right thumb  Intensity: 3 Onset of pain  yesterday   Primary Care Provider:  Doralee Albino MD  CC:  Pt is wondering if he can have a Tdap after having a splinter in Right thumb.Marland Kitchen  History of Present Illness: Patient he was picking up a plank yesterday evening while cleaning up his yard and a splinter approximately 1 inch long went into his right thumb.  States he immediately pulled splinter out and believes he got all of the splinter out but today noticed pain, swelling and redness to thumb with some decreased ROM.  Reports he had not used anything on area, no associated drainage, numbness or tingling to right hand. 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  Habits & Providers  Alcohol-Tobacco-Diet     Tobacco Status: never  Allergies: 1)  Tetracycline Hcl (Tetracycline Hcl) 2)  Amoxicillin (Amoxicillin) 3)  Erythromycin Ethylsuccinate 4)  Sulfamethoxazole (Sulfamethoxazole) 5)  Simvastatin 6)  Zetia (Ezetimibe)  Past History:  Past Medical History: carotid ultrasound 2/07: Rt nl: Lt<50% stenosis,  ETT OK 02/24/06,  finished external beam radiation to prostate 07/00, gold seeds in prostate 09/00,  thyroid radiation for hyperthyroidism diabetes  Review of Systems General:  Denies chills, fever, and sweats. CV:  Denies chest pain or discomfort, lightheadness, and shortness of breath with exertion. Resp:  Denies chest discomfort, cough, and shortness of breath. Derm:  Complains of changes in color of skin; denies flushing. Neuro:  Denies numbness  and tingling.  Physical Exam  General:  Well-developed,well-nourished,in no acute distress; alert,appropriate and cooperative throughout examination Lungs:  Normal respiratory effort, chest expands symmetrically. Lungs are clear to auscultation, no crackles or wheezes. Heart:  Normal rate and regular rhythm. S1 and S2 normal without gallop, murmur, click, rub or other extra sounds. Msk:  normal ROM.   Pulses:  R radial normal.   Extremities:  Erythematous swollen warm painful dorsal aspect of right thumb extending from 1st PIP joint to tip, painful with palpation, minimal limitation of joint, entry point noted approx 1mm above joint, no drainage, skin intact    Impression & Recommendations:  Problem # 1:  CELLULITIS, FINGER (ICD-681.00) Recheck on Thrusday.  Soak four times per day. Start antibiotics today, instructed to keep area covered while working today.Tetanus is up to date. His updated medication list for this problem includes:    Cephalexin 500 Mg Caps (Cephalexin) .Marland Kitchen... Take one tab by mouth three times a day x 10 days  Complete Medication List: 1)  Aspirin 81 Mg Tbec (Aspirin) .... One daily 2)  Cozaar 100 Mg Tabs (Losartan potassium) .... Take 1 tablet by mouth once a day 3)  Hydrochlorothiazide 25 Mg Tabs (Hydrochlorothiazide) .... Take 1 tablet by mouth once a day 4)  Lorazepam 0.5 Mg Tabs (Lorazepam) .Marland Kitchen.. 1 tablet by mouth twice a day 5)  Metformin Hcl 1000 Mg Tabs (Metformin hcl) .... Take 1 tablet by mouth twice a day 6)  Synthroid  50 Mcg Tabs (Levothyroxine sodium) .... One by mouth daily 7)  Triamcinolone Acetonide 0.1 % Oint (Triamcinolone acetonide) .... Apply 1 a small amount to skin twice a day.  disp 60 gram tube 8)  Amlodipine Besylate 10 Mg Tabs (Amlodipine besylate) .... Once daily 9)  Propranolol Hcl 10 Mg Tabs (Propranolol hcl) .... One by mouth three times a day (help for high blood pressure and for tremor) 10)  Cephalexin 500 Mg Caps (Cephalexin) .... Take  one tab by mouth three times a day x 10 days  Other Orders: [facility]- Est Level  3 ([address]109)  Patient Instructions: 1)  Take antibiotic as prescribed. 2)  Follow up Thursday in Geriatric clinic for recheck. 3)  Warm soaks today four times daily. 4)  Keep area covered while working Prescriptions: CEPHALEXIN 500 MG CAPS (CEPHALEXIN) take one tab by mouth three times a day x 10 days  #30 x 0   Entered and Authorized by:   Luretha Murphy NP   Signed by:   Luretha Murphy NP on 01/28/2010   Method used:   [facility]  [address] [id number]       [address]       [place], [place]  [address]       Ph: [phone] or [phone]       Fax: [phone]   RxID:   [id number] CEPHALEXIN 500 MG CAPS (CEPHALEXIN) take one tab by mouth three times a day x 10 days  #30 x 0   Entered and Authorized by:   Luretha Murphy NP   Signed by:   Luretha Murphy NP on 01/28/2010   Method used:   Electronically to        [facility] [address] [id number] (retail)       [address] [place], [place]  [address]       Ph: [phone]       Fax: [phone]   RxID:   [id number]     Prevention & Chronic Care Immunizations   Influenza vaccine: Fluvax MCR  (07/31/2009)   Influenza vaccine due: 08/07/2008    Tetanus booster: 09/21/2008: given   Tetanus booster due: 09/21/2018    Pneumococcal vaccine: Pneumovax  (06/14/2009)   Pneumococcal vaccine due: None    H. zoster vaccine: Not documented  Colorectal Screening   Hemoccult: Done.  (05/13/2003)   Hemoccult due: Not Indicated    Colonoscopy: normal  (01/14/2009)   Colonoscopy due: 01/15/2019  Other Screening   PSA: sees [facility]  (09/21/2008)   PSA due due: Not Indicated   Smoking status: never  (01/28/2010)  Diabetes Mellitus   HgbA1C: 7.6  (12/18/2009)   Hemoglobin A1C due: 02/11/2008    Eye exam: normal  (08/21/2008)   Eye exam due: 08/2009    Foot exam: yes  (06/14/2009)   High risk foot:  Not documented   Foot care education: Not documented    Urine microalbumin/creatinine ratio: Not documented   Urine microalbumin action/deferral: Not indicated  Lipids   Total Cholesterol: 219  (12/18/2009)   Lipid panel action/deferral: Lipid Panel ordered   LDL: 150  (12/18/2009)   LDL Direct: 127  (01/04/2009)   HDL: 49  (12/18/2009)   Triglycerides: 99  (12/18/2009)    SGOT (AST): 23  (12/18/2009)   SGPT (ALT): 20  (12/18/2009)   Alkaline phosphatase: 74  (12/18/2009)  Total bilirubin: 0.4  (12/18/2009)  Hypertension   Last Blood Pressure: 143 / 93  (01/28/2010)   Serum creatinine: 0.88  (12/18/2009)   Serum potassium 3.4  (12/18/2009)  Self-Management Support :   Personal Goals (by the next clinic visit) :     Personal A1C goal: 7  (06/14/2009)     Personal blood pressure goal: 130/80  (06/14/2009)     Personal LDL goal: 100  (06/14/2009)    Diabetes self-management support: Written self-care plan  (07/31/2009)    Hypertension self-management support: Written self-care plan  (07/31/2009)    Lipid self-management support: Written self-care plan  (07/31/2009)

## 2010-11-11 NOTE — Assessment & Plan Note (Signed)
Summary: f/u [facility] per Dr.Hensel/[facility]   Vital Signs:  Patient profile:   77 year old male Weight:      217.8 pounds Temp:     98.2 degrees F oral Pulse rate:   73 / minute Pulse rhythm:   regular BP sitting:   134 / 85  (right arm) Cuff size:   regular  Vitals Entered By: Loralee Pacas CMA (July 23, 2010 2:21 PM) CC: follow-up visit   Primary Care Byanka Landrus:  Doralee Albino MD  CC:  follow-up visit.  History of Present Illness: Abd discomfort, seen by Dr. Loreta Ave.  Given stool softener and natural medicine.   Groin rash Nerve irritation Left arm.  Hx of C spine problems Pain is intermitant.  Habits & Providers  Alcohol-Tobacco-Diet     Tobacco Status: never     Tobacco Counseling: to remain off tobacco products  Current Medications (verified): 1)  Aspirin 81 Mg  Tbec (Aspirin) .... One Daily 2)  Cozaar 100 Mg Tabs (Losartan Potassium) .... Take 1 Tablet By Mouth Once A Day 3)  Hydrochlorothiazide 25 Mg Tabs (Hydrochlorothiazide) .... Take 1 Tablet By Mouth Once A Day 4)  Lorazepam 0.5 Mg Tabs (Lorazepam) .Marland Kitchen.. 1 Tablet By Mouth Twice A Day 5)  Metformin Hcl 1000 Mg Tabs (Metformin Hcl) .... Take 1 Tablet By Mouth Twice A Day 6)  Synthroid 50 Mcg  Tabs (Levothyroxine Sodium) .... One By Mouth Daily 7)  Triamcinolone Acetonide 0.1 % Oint (Triamcinolone Acetonide) .... Apply 1 A Small Amount To Skin Twice A Day.  Disp 60 Gram Tube 8)  Amlodipine Besylate 10 Mg  Tabs (Amlodipine Besylate) .... Once Daily 9)  Propranolol Hcl 10 Mg Tabs (Propranolol Hcl) .... One By Mouth Three Times A Day (Help For High Blood Pressure and For Tremor) 10)  Proair Hfa 108 (90 Base) Mcg/act Aers (Albuterol Sulfate) .... Two Puffs Four Times Per Day As Needed Wheezing. 11)  Furosemide 20 Mg Tabs (Furosemide) .... Take 1 Tab By Mouth Every Day 12)  Terbinafine Hcl 1 % Crea (Terbinafine Hcl) .... Apply To Affected Area Two Times A Day Disp 30 Gram Tube  Allergies (verified): 1)  Tetracycline Hcl  (Tetracycline Hcl) 2)  Amoxicillin (Amoxicillin) 3)  Erythromycin Ethylsuccinate 4)  Sulfamethoxazole (Sulfamethoxazole) 5)  Simvastatin 6)  Zetia (Ezetimibe)   Prevention & Chronic Care Immunizations   Influenza vaccine: Fluvax MCR  (07/23/2010)   Influenza vaccine due: 06/13/2011    Tetanus booster: 09/21/2008: given   Tetanus booster due: 09/21/2018    Pneumococcal vaccine: Pneumovax  (06/14/2009)   Pneumococcal vaccine due: None    H. zoster vaccine: Not documented  Colorectal Screening   Hemoccult: Done.  (05/13/2003)   Hemoccult due: Not Indicated    Colonoscopy: normal  (01/14/2009)   Colonoscopy due: 01/15/2019  Other Screening   PSA: sees [facility]  (09/21/2008)   PSA due due: Not Indicated   Smoking status: never  (07/23/2010)  Diabetes Mellitus   HgbA1C: 8.0  (05/14/2010)   Hemoglobin A1C due: 02/11/2008    Eye exam: normal  (08/21/2008)   Eye exam due: 08/2009    Foot exam: yes  (07/23/2010)   High risk foot: Not documented   Foot care education: Not documented    Urine microalbumin/creatinine ratio: Not documented   Urine microalbumin action/deferral: Not indicated    Diabetes flowsheet reviewed?: Yes   Progress toward A1C goal: Unchanged  Lipids   Total Cholesterol: 219  (12/18/2009)   Lipid panel action/deferral: Lipid Panel ordered  LDL: 150  (12/18/2009)   LDL Direct: 127  (01/04/2009)   HDL: 49  (12/18/2009)   Triglycerides: 99  (12/18/2009)    SGOT (AST): 23  (12/18/2009)   SGPT (ALT): 20  (12/18/2009)   Alkaline phosphatase: 74  (12/18/2009)   Total bilirubin: 0.4  (12/18/2009)    Lipid flowsheet reviewed?: Yes   Progress toward LDL goal: Unchanged  Hypertension   Last Blood Pressure: 134 / 85  (07/23/2010)   Serum creatinine: 0.88  (12/18/2009)   Serum potassium 3.4  (12/18/2009)    Hypertension flowsheet reviewed?: Yes   Progress toward BP goal: At goal  Self-Management Support :   Personal Goals (by the next clinic  visit) :     Personal A1C goal: 7  (06/14/2009)     Personal blood pressure goal: 130/80  (06/14/2009)     Personal LDL goal: 100  (06/14/2009)    Diabetes self-management support: Written self-care plan  (02/19/2010)    Hypertension self-management support: Written self-care plan  (02/19/2010)    Lipid self-management support: Written self-care plan  (02/19/2010)   Past History:  Past medical, surgical, family and social histories (including risk factors) reviewed, and no changes noted (except as noted below).  Past Medical History: Reviewed history from 01/28/2010 and no changes required. carotid ultrasound 2/07: Rt nl: Lt<50% stenosis,  ETT OK 02/24/06,  finished external beam radiation to prostate 07/00, gold seeds in prostate 09/00,  thyroid radiation for hyperthyroidism diabetes  Past Surgical History: Reviewed history from 12/09/2006 and no changes required. 08/04/06 ldl: 80 direct - 08/20/2006, ABI: 0.85 mild PAD - 02/05/2005, Barium Enema -, cardiac cath 01/00 normal -, cardiolyte no ischemia, EF=43% - 10/26/2000, CT - Head -, CXR -, ECG -, echo EF=55-65% - 02/16/2005, thyroid scan -, US - Abdominal -, Xray - Spine -  Family History: Reviewed history from 12/09/2006 and no changes required. CAD  Social History: Reviewed history from 12/09/2006 and no changes required. smokes 3-4 cigarettes/day quit 10/2000; non drinker; married; Paediatric nurse, owns business; very regular exercise  Physical Exam  General:  Well-developed,well-nourished,in no acute distress; alert,appropriate and cooperative throughout examination Lungs:  Normal respiratory effort, chest expands symmetrically. Lungs are clear to auscultation, no crackles or wheezes. Heart:  Normal rate and regular rhythm. S1 and S2 normal without gallop, murmur, click, rub or other extra sounds. Abdomen:  Bowel sounds positive,abdomen soft  without masses, organomegaly or hernias noted.  Some tenderness in Rt upper  quadrent. Extremities:  no edema Skin:  groin rash typical of tinea cruris  Diabetes Management Exam:    Foot Exam (with socks and/or shoes not present):       Sensory-Pinprick/Light touch:          Left medial foot (L-4): normal          Left dorsal foot (L-5): normal          Left lateral foot (S-1): normal          Right medial foot (L-4): normal          Right dorsal foot (L-5): normal          Right lateral foot (S-1): normal       Sensory-Monofilament:          Left foot: normal          Right foot: normal       Inspection:          Left foot: normal  Right foot: normal       Nails:          Left foot: normal          Right foot: normal   Impression & Recommendations:  Problem # 1:  ABDOMINAL PAIN (ICD-789.00) Atypical but Rt upper quadrent location suggests gall stones. Orders: Ultrasound (Ultrasound) [facility]- Est  Level 4 (84132)  Problem # 2:  CERVICAL SPINE DISORDER, NOS (ICD-723.9) Suspect nerve root irritation at the C spine. Orders: Radiology other (Radiology Other) [facility]- Est  Level 4 (44010)  Problem # 3:  TINEA CRURIS (ICD-110.3)  Orders: [facility]- Est  Level 4 (27253)  Problem # 4:  DIABETES MELLITUS II, UNCOMPLICATED (ICD-250.00)  His updated medication list for this problem includes:    Aspirin 81 Mg Tbec (Aspirin) ..... One daily    Cozaar 100 Mg Tabs (Losartan potassium) .Marland Kitchen... Take 1 tablet by mouth once a day    Metformin Hcl 1000 Mg Tabs (Metformin hcl) .Marland Kitchen... Take 1 tablet by mouth twice a day  Labs Reviewed: Creat: 0.88 (12/18/2009)     Last Eye Exam: normal (08/21/2008) Reviewed HgBA1c results: 8.0 (05/14/2010)  7.6 (12/18/2009)  Orders: [facility]- Est  Level 4 (66440)  Complete Medication List: 1)  Aspirin 81 Mg Tbec (Aspirin) .... One daily 2)  Cozaar 100 Mg Tabs (Losartan potassium) .... Take 1 tablet by mouth once a day 3)  Hydrochlorothiazide 25 Mg Tabs (Hydrochlorothiazide) .... Take 1 tablet by mouth once a day 4)  Lorazepam 0.5  Mg Tabs (Lorazepam) .Marland Kitchen.. 1 tablet by mouth twice a day 5)  Metformin Hcl 1000 Mg Tabs (Metformin hcl) .... Take 1 tablet by mouth twice a day 6)  Synthroid 50 Mcg Tabs (Levothyroxine sodium) .... One by mouth daily 7)  Triamcinolone Acetonide 0.1 % Oint (Triamcinolone acetonide) .... Apply 1 a small amount to skin twice a day.  disp 60 gram tube 8)  Amlodipine Besylate 10 Mg Tabs (Amlodipine besylate) .... Once daily 9)  Propranolol Hcl 10 Mg Tabs (Propranolol hcl) .... One by mouth three times a day (help for high blood pressure and for tremor) 10)  Proair Hfa 108 (90 Base) Mcg/act Aers (Albuterol sulfate) .... Two puffs four times per day as needed wheezing. 11)  Furosemide 20 Mg Tabs (Furosemide) .... Take 1 tab by mouth every day 12)  Terbinafine Hcl 1 % Crea (Terbinafine hcl) .... Apply to affected area two times a day disp 30 gram tube  Other Orders: Influenza Vaccine MCR (34742) Prescriptions: TERBINAFINE HCL 1 % CREA (TERBINAFINE HCL) Apply to affected area two times a day Disp 30 gram tube  #30 x 1   Entered and Authorized by:   Doralee Albino MD   Signed by:   Doralee Albino MD on 07/23/2010   Method used:   [facility]        [facility] [id number]       [address]       [place], [place]  [address]       Ph: [phone] or [phone]       Fax: [phone]   RxID:   [id number]    Immunizations Administered:  Influenza Vaccine # 1:    Vaccine Type: Fluvax MCR    Site: right deltoid    Mfr: [employer]    Dose: 0.5 ml    Route: IM    Given by: Loralee Pacas CMA    Exp. Date: 04/08/2011    Lot #: [id number]  VIS given: 05/06/10 version given July 23, 2010.  Flu Vaccine Consent Questions:    Do you have a history of severe allergic reactions to this vaccine? no    Any prior history of allergic reactions to egg and/or gelatin? no    Do you have a sensitivity to the preservative Thimersol? no    Do you have a past history of  Guillan-Barre Syndrome? no    Do you currently have an acute febrile illness? no    Have you ever had a severe reaction to latex? no    Vaccine information given and explained to patient? yes

## 2010-11-11 NOTE — Progress Notes (Signed)
Summary: phn msg  Phone Note Call from Patient Call back at [phone]   Caller: Patient Summary of Call: needs to talk to Hensel about KETOCONAZOLE 2 % CREAM - not doing what he needs it to and needs to talk to Dr H about what else to do. Initial call taken by: De Nurse,  July 11, 2010 11:00 AM  Follow-up for Phone Call        Called and told will need to see again to diagnose rash.  He will call for appointment. Follow-up by: Doralee Albino MD,  July 11, 2010 1:37 PM     Appended Document: phn msg pt called to sched appt for this prob and first avail is 10/12 do you think he could wait or see someone else pls advise

## 2010-11-11 NOTE — Consult Note (Signed)
Summary: [facility]  [facility]   Imported By: Clydell Hakim 07/09/2010 10:59:34  _____________________________________________________________________  External Attachment:    Type:   Image     Comment:   External Document

## 2010-11-11 NOTE — Progress Notes (Signed)
  Phone Note Call from Patient   Caller: Spouse Call For: [phone] Summary of Call: Vikki Ports called to ask that you call the house as soon as you know the results.  According to the department he went to the results should be in this afternoon.  Vikki Ports  says she need to be there because sometimes he gets confused about things.  There both quite nervous.   Initial call taken by: Abundio Miu,  July 29, 2010 12:11 PM  Follow-up for Phone Call        Given the good news that the CT was normal.  No further testing needed.  On a side note, he has a history of prostate cancer and it has been over one year since he has seen the urologist.  They will make an appointment.  I will get a PSA. Follow-up by: Doralee Albino MD,  July 29, 2010 4:24 PM

## 2010-11-11 NOTE — Progress Notes (Signed)
Summary: refill  Phone Note Refill Request Call back at Work Phone [phone] Message from:  Patient  Refills Requested: Medication #1:  TRIAMCINOLONE ACETONIDE 0.1 % OINT Apply 1 a small amount to skin twice a day.  Disp 60 gram tube   Notes: would [facility]  Initial call taken by: De Nurse,  November 27, 2009 12:00 PM Summary of Call: needs sal  Follow-up for Phone Call        done electronically Follow-up by: Doralee Albino MD,  November 27, 2009 2:59 PM  Additional Follow-up for Phone Call Additional follow up Details #1::        Pt notified Rx at phar Additional Follow-up by: Gladstone Pih,  November 28, 2009 10:42 AM    New/Updated Medications: TRIAMCINOLONE ACETONIDE 0.1 % OINT (TRIAMCINOLONE ACETONIDE) Apply 1 a small amount to skin twice a day.  Disp 60 gram tube Prescriptions: TRIAMCINOLONE ACETONIDE 0.1 % OINT (TRIAMCINOLONE ACETONIDE) Apply 1 a small amount to skin twice a day.  Disp 60 gram tube  #60 x 4   Entered and Authorized by:   Doralee Albino MD   Signed by:   Doralee Albino MD on 11/27/2009   Method used:   [facility]        [facility] [facility]       [address]       [place], [place]  [address]       Ph: [phone] or [phone]       Fax: [phone]   RxID:   [id number]

## 2010-11-11 NOTE — Progress Notes (Signed)
Summary: phn msg  Phone Note Call from Patient Call back at [phone]   Caller: Patient Summary of Call: Would like to talk to Dr. Karleen Dolphin about a salve he gave him about 3 weeks ago. Initial call taken by: Clydell Hakim,  June 05, 2010 12:04 PM  Follow-up for Phone Call        Used up 60 gm tube quickly.  Some improvement but rash not gone.  Will refill and emphasized to use only a small amount. Follow-up by: Doralee Albino MD,  June 06, 2010 9:18 AM    Prescriptions: KETOCONAZOLE 2 % CREA (KETOCONAZOLE) apply two times a day to affected area.  Disp 60 gram tube  #60 x 0   Entered and Authorized by:   Doralee Albino MD   Signed by:   Doralee Albino MD on 06/06/2010   Method used:   [facility]        [facility]  [address] [id number]       [address]       [place], [place]  [address]       Ph: [phone] or [phone]       Fax: [phone]   RxID:   [id number]

## 2010-11-11 NOTE — Assessment & Plan Note (Signed)
Summary: f/u,df   Vital Signs:  Patient profile:   77 year old male Weight:      218.8 pounds Temp:     98.3 degrees F oral Pulse rate:   63 / minute Pulse rhythm:   regular BP sitting:   130 / 80  (left arm) Cuff size:   regular  Vitals Entered By: Loralee Pacas CMA (May 14, 2010 4:04 PM)  Primary Care Provider:  Doralee Albino MD   History of Present Illness: C/O two skin lesions: one on scalp and one on face DM - admits to dietary indiscretion.  Weight loss noted.  He feels he needs more.   Current Medications (verified): 1)  Aspirin 81 Mg  Tbec (Aspirin) .... One Daily 2)  Cozaar 100 Mg Tabs (Losartan Potassium) .... Take 1 Tablet By Mouth Once A Day 3)  Hydrochlorothiazide 25 Mg Tabs (Hydrochlorothiazide) .... Take 1 Tablet By Mouth Once A Day 4)  Lorazepam 0.5 Mg Tabs (Lorazepam) .Marland Kitchen.. 1 Tablet By Mouth Twice A Day 5)  Metformin Hcl 1000 Mg Tabs (Metformin Hcl) .... Take 1 Tablet By Mouth Twice A Day 6)  Synthroid 50 Mcg  Tabs (Levothyroxine Sodium) .... One By Mouth Daily 7)  Triamcinolone Acetonide 0.1 % Oint (Triamcinolone Acetonide) .... Apply 1 A Small Amount To Skin Twice A Day.  Disp 60 Gram Tube 8)  Amlodipine Besylate 10 Mg  Tabs (Amlodipine Besylate) .... Once Daily 9)  Propranolol Hcl 10 Mg Tabs (Propranolol Hcl) .... One By Mouth Three Times A Day (Help For High Blood Pressure and For Tremor) 10)  Proair Hfa 108 (90 Base) Mcg/act Aers (Albuterol Sulfate) .... Two Puffs Four Times Per Day As Needed Wheezing. 11)  Furosemide 20 Mg Tabs (Furosemide) .... Take 1 Tab By Mouth Every Day 12)  Ketoconazole 2 % Crea (Ketoconazole) .... Apply Two Times A Day To Affected Area.  Disp 60 Gram Tube  Allergies (verified): 1)  Tetracycline Hcl (Tetracycline Hcl) 2)  Amoxicillin (Amoxicillin) 3)  Erythromycin Ethylsuccinate 4)  Sulfamethoxazole (Sulfamethoxazole) 5)  Simvastatin 6)  Zetia (Ezetimibe)  Past History:  Past medical, surgical, family and social histories  (including risk factors) reviewed, and no changes noted (except as noted below).  Past Medical History: Reviewed history from 01/28/2010 and no changes required. carotid ultrasound 2/07: Rt nl: Lt<50% stenosis,  ETT OK 02/24/06,  finished external beam radiation to prostate 07/00, gold seeds in prostate 09/00,  thyroid radiation for hyperthyroidism diabetes  Past Surgical History: Reviewed history from 12/09/2006 and no changes required. 08/04/06 ldl: 80 direct - 08/20/2006, ABI: 0.85 mild PAD - 02/05/2005, Barium Enema -, cardiac cath 01/00 normal -, cardiolyte no ischemia, EF=43% - 10/26/2000, CT - Head -, CXR -, ECG -, echo EF=55-65% - 02/16/2005, thyroid scan -, US - Abdominal -, Xray - Spine -  Family History: Reviewed history from 12/09/2006 and no changes required. CAD  Social History: Reviewed history from 12/09/2006 and no changes required. smokes 3-4 cigarettes/day quit 10/2000; non drinker; married; Paediatric nurse, owns business; very regular exercise  Review of Systems       The patient complains of prolonged cough.  The patient denies chest pain, syncope, dyspnea on exertion, and peripheral edema.         Cough was bad in spring, improved, now worse that he is mowing grass.  Seems allergic.  Physical Exam  General:  Well-developed,well-nourished,in no acute distress; alert,appropriate and cooperative throughout examination Lungs:  Normal respiratory effort, chest expands symmetrically. Lungs are clear  to auscultation, no crackles or wheezes. Heart:  Normal rate and regular rhythm. S1 and S2 normal without gallop, murmur, click, rub or other extra sounds. Skin:  Skin tag on face (Rt cheek) and wart on scalp (left occiput) Both frozen without difficulty.   Impression & Recommendations:  Problem # 1:  DIABETES MELLITUS II, UNCOMPLICATED (ICD-250.00) He promises a renewed effort on diet and exercise.  He has done this in the past.  Showed him A1C graph that demonstrates good control  when good diet and exercise. His updated medication list for this problem includes:    Aspirin 81 Mg Tbec (Aspirin) ..... One daily    Cozaar 100 Mg Tabs (Losartan potassium) .Marland Kitchen... Take 1 tablet by mouth once a day    Metformin Hcl 1000 Mg Tabs (Metformin hcl) .Marland Kitchen... Take 1 tablet by mouth twice a day  Orders: A1C-[facility] (54098) [facility]- Est  Level 4 (11914)  Labs Reviewed: Creat: 0.88 (12/18/2009)     Last Eye Exam: normal (08/21/2008) Reviewed HgBA1c results: 8.0 (05/14/2010)  7.6 (12/18/2009)  Problem # 2:  HYPERTENSION, BENIGN SYSTEMIC (ICD-401.1)  At goal His updated medication list for this problem includes:    Cozaar 100 Mg Tabs (Losartan potassium) .Marland Kitchen... Take 1 tablet by mouth once a day    Hydrochlorothiazide 25 Mg Tabs (Hydrochlorothiazide) .Marland Kitchen... Take 1 tablet by mouth once a day    Amlodipine Besylate 10 Mg Tabs (Amlodipine besylate) ..... Once daily    Propranolol Hcl 10 Mg Tabs (Propranolol hcl) ..... One by mouth three times a day (help for high blood pressure and for tremor)    Furosemide 20 Mg Tabs (Furosemide) .Marland Kitchen... Take 1 tab by mouth every day  BP today: 130/80 Prior BP: 145/75 (02/26/2010)  Labs Reviewed: K+: 3.4 (12/18/2009) Creat: : 0.88 (12/18/2009)   Chol: 219 (12/18/2009)   HDL: 49 (12/18/2009)   LDL: 150 (12/18/2009)   TG: 99 (12/18/2009)  Orders: [facility]- Est  Level 4 (99214)  Problem # 3:  BENIGN NEOPLASM OF SCALP AND SKIN OF NECK (ICD-216.4)  Two skin lesions frozen as describe in PE  Orders: [facility]- Est  Level 4 (78295)  Complete Medication List: 1)  Aspirin 81 Mg Tbec (Aspirin) .... One daily 2)  Cozaar 100 Mg Tabs (Losartan potassium) .... Take 1 tablet by mouth once a day 3)  Hydrochlorothiazide 25 Mg Tabs (Hydrochlorothiazide) .... Take 1 tablet by mouth once a day 4)  Lorazepam 0.5 Mg Tabs (Lorazepam) .Marland Kitchen.. 1 tablet by mouth twice a day 5)  Metformin Hcl 1000 Mg Tabs (Metformin hcl) .... Take 1 tablet by mouth twice a day 6)  Synthroid 50 Mcg Tabs  (Levothyroxine sodium) .... One by mouth daily 7)  Triamcinolone Acetonide 0.1 % Oint (Triamcinolone acetonide) .... Apply 1 a small amount to skin twice a day.  disp 60 gram tube 8)  Amlodipine Besylate 10 Mg Tabs (Amlodipine besylate) .... Once daily 9)  Propranolol Hcl 10 Mg Tabs (Propranolol hcl) .... One by mouth three times a day (help for high blood pressure and for tremor) 10)  Proair Hfa 108 (90 Base) Mcg/act Aers (Albuterol sulfate) .... Two puffs four times per day as needed wheezing. 11)  Furosemide 20 Mg Tabs (Furosemide) .... Take 1 tab by mouth every day 12)  Ketoconazole 2 % Crea (Ketoconazole) .... Apply two times a day to affected area.  disp 60 gram tube  Patient Instructions: 1)  Please schedule a follow-up appointment in 3 months .  Prescriptions: KETOCONAZOLE 2 % CREA (  KETOCONAZOLE) apply two times a day to affected area.  Disp 60 gram tube  #60 x 1   Entered and Authorized by:   Doralee Albino MD   Signed by:   Doralee Albino MD on 05/14/2010   Method used:   [facility]        [facility] [id number]       [address]       [place], [place]  [address]       Ph: [phone] or [phone]       Fax: [phone]   RxID:   [id number]   Laboratory Results   Blood Tests   Date/Time Received: May 14, 2010 4:07 PM  Date/Time Reported: May 14, 2010 4:17 PM   HGBA1C: 8.0%   (Normal Range: Non-Diabetic - 3-6%   Control Diabetic - 6-8%)  Comments: ...........test performed by..........Marland KitchenLoralee Pacas, CMA entered by Terese Door, CMA            Prevention & Chronic Care Immunizations   Influenza vaccine: Fluvax MCR  (07/31/2009)   Influenza vaccine due: 06/13/2011    Tetanus booster: 09/21/2008: given   Tetanus booster due: 09/21/2018    Pneumococcal vaccine: Pneumovax  (06/14/2009)   Pneumococcal vaccine due: None    H. zoster vaccine: Not documented  Colorectal Screening   Hemoccult: Done.  (05/13/2003)    Hemoccult due: Not Indicated    Colonoscopy: normal  (01/14/2009)   Colonoscopy due: 01/15/2019  Other Screening   PSA: sees [facility]  (09/21/2008)   PSA due due: Not Indicated   Smoking status: never  (02/26/2010)  Diabetes Mellitus   HgbA1C: 8.0  (05/14/2010)   Hemoglobin A1C due: 02/11/2008    Eye exam: normal  (08/21/2008)   Eye exam due: 08/2009    Foot exam: yes  (06/14/2009)   High risk foot: Not documented   Foot care education: Not documented    Urine microalbumin/creatinine ratio: Not documented   Urine microalbumin action/deferral: Not indicated    Diabetes flowsheet reviewed?: Yes   Progress toward A1C goal: Unchanged  Lipids   Total Cholesterol: 219  (12/18/2009)   Lipid panel action/deferral: Lipid Panel ordered   LDL: 150  (12/18/2009)   LDL Direct: 127  (01/04/2009)   HDL: 49  (12/18/2009)   Triglycerides: 99  (12/18/2009)    SGOT (AST): 23  (12/18/2009)   SGPT (ALT): 20  (12/18/2009)   Alkaline phosphatase: 74  (12/18/2009)   Total bilirubin: 0.4  (12/18/2009)    Lipid flowsheet reviewed?: Yes   Progress toward LDL goal: Unchanged  Hypertension   Last Blood Pressure: 130 / 80  (05/14/2010)   Serum creatinine: 0.88  (12/18/2009)   Serum potassium 3.4  (12/18/2009)    Hypertension flowsheet reviewed?: Yes   Progress toward BP goal: At goal  Self-Management Support :   Personal Goals (by the next clinic visit) :     Personal A1C goal: 7  (06/14/2009)     Personal blood pressure goal: 130/80  (06/14/2009)     Personal LDL goal: 100  (06/14/2009)    Diabetes self-management support: Written self-care plan  (02/19/2010)    Hypertension self-management support: Written self-care plan  (02/19/2010)    Lipid self-management support: Written self-care plan  (02/19/2010)

## 2010-11-11 NOTE — Assessment & Plan Note (Signed)
Summary: f/u visit/bmc   Vital Signs:  Patient profile:   77 year old male Weight:      216.3 pounds Temp:     97.8 degrees F oral Pulse rate:   64 / minute Pulse rhythm:   regular BP sitting:   165 / 98  (left arm) Cuff size:   regular  Vitals Entered By: Loralee Pacas CMA (August 13, 2010 4:18 PM)  Serial Vital Signs/Assessments:  Time      Position  BP       Pulse  Resp  Temp     By                     132/80                         Doralee Albino MD  CC: follow-up visit   Primary Care Provider:  Doralee Albino MD  CC:  follow-up visit.  History of Present Illness: Mr Paolini is worked up today - he is anxious to tell the story of his experience with scare for pancreatic cancer.  Describes having strengthened his faith and recommitted to health. BP up today.  Lower on repeat after story told.  States home BPs running good. DM states home BS running good.  A1C done as leaving is slightly better Abd pain has resolved. I expressed concerns about untreated high cholesterol  Habits & Providers  Alcohol-Tobacco-Diet     Tobacco Status: never     Tobacco Counseling: to remain off tobacco products  Current Medications (verified): 1)  Aspirin 81 Mg  Tbec (Aspirin) .... One Daily 2)  Cozaar 100 Mg Tabs (Losartan Potassium) .... Take 1 Tablet By Mouth Once A Day 3)  Hydrochlorothiazide 25 Mg Tabs (Hydrochlorothiazide) .... Take 1 Tablet By Mouth Once A Day 4)  Lorazepam 0.5 Mg Tabs (Lorazepam) .Marland Kitchen.. 1 Tablet By Mouth Twice A Day 5)  Metformin Hcl 1000 Mg Tabs (Metformin Hcl) .... Take 1 Tablet By Mouth Twice A Day 6)  Synthroid 50 Mcg  Tabs (Levothyroxine Sodium) .... One By Mouth Daily 7)  Triamcinolone Acetonide 0.1 % Oint (Triamcinolone Acetonide) .... Apply 1 A Small Amount To Skin Twice A Day.  Disp 60 Gram Tube 8)  Amlodipine Besylate 10 Mg  Tabs (Amlodipine Besylate) .... Once Daily 9)  Propranolol Hcl 10 Mg Tabs (Propranolol Hcl) .... One By Mouth Three Times A Day  (Help For High Blood Pressure and For Tremor) 10)  Proair Hfa 108 (90 Base) Mcg/act Aers (Albuterol Sulfate) .... Two Puffs Four Times Per Day As Needed Wheezing. 11)  Furosemide 20 Mg Tabs (Furosemide) .... Take 1 Tab By Mouth Every Day 12)  Clotrimazole-Betamethasone 1-0.05 % Crea (Clotrimazole-Betamethasone) .... Apply Small Amount Two Times A Day Disp 30 Gram Tube  Allergies (verified): 1)  Tetracycline Hcl (Tetracycline Hcl) 2)  Amoxicillin (Amoxicillin) 3)  Erythromycin Ethylsuccinate 4)  Sulfamethoxazole (Sulfamethoxazole) 5)  Simvastatin 6)  Zetia (Ezetimibe)  Past History:  Past medical, surgical, family and social histories (including risk factors) reviewed, and no changes noted (except as noted below).  Past Medical History: Reviewed history from 01/28/2010 and no changes required. carotid ultrasound 2/07: Rt nl: Lt<50% stenosis,  ETT OK 02/24/06,  finished external beam radiation to prostate 07/00, gold seeds in prostate 09/00,  thyroid radiation for hyperthyroidism diabetes  Past Surgical History: Reviewed history from 12/09/2006 and no changes required. 08/04/06 ldl: 80 direct - 08/20/2006, ABI: 0.85 mild PAD -  02/05/2005, Barium Enema -, cardiac cath 01/00 normal -, cardiolyte no ischemia, EF=43% - 10/26/2000, CT - Head -, CXR -, ECG -, echo EF=55-65% - 02/16/2005, thyroid scan -, US - Abdominal -, Xray - Spine -  Family History: Reviewed history from 12/09/2006 and no changes required. CAD  Social History: Reviewed history from 12/09/2006 and no changes required. smokes 3-4 cigarettes/day quit 10/2000; non drinker; married; Paediatric nurse, owns business; very regular exercise  Physical Exam  General:  Well-developed,well-nourished,in no acute distress; alert,appropriate and cooperative throughout examination Lungs:  Normal respiratory effort, chest expands symmetrically. Lungs are clear to auscultation, no crackles or wheezes. Heart:  Normal rate and regular rhythm. S1 and  S2 normal without gallop, murmur, click, rub or other extra sounds. Abdomen:  Bowel sounds positive,abdomen soft and non-tender without masses, organomegaly or hernias noted.   Impression & Recommendations:  Problem # 1:  HYPERTENSION, BENIGN SYSTEMIC (ICD-401.1) Assessment Unchanged  He is committed to diet and exercise His updated medication list for this problem includes:    Cozaar 100 Mg Tabs (Losartan potassium) .Marland Kitchen... Take 1 tablet by mouth once a day    Hydrochlorothiazide 25 Mg Tabs (Hydrochlorothiazide) .Marland Kitchen... Take 1 tablet by mouth once a day    Amlodipine Besylate 10 Mg Tabs (Amlodipine besylate) ..... Once daily    Propranolol Hcl 10 Mg Tabs (Propranolol hcl) ..... One by mouth three times a day (help for high blood pressure and for tremor)    Furosemide 20 Mg Tabs (Furosemide) .Marland Kitchen... Take 1 tab by mouth every day  Orders: [facility]- Est  Level 4 ([address]214)  Problem # 2:  ABDOMINAL PAIN (ICD-789.00) Assessment: Improved Resolved  Problem # 3:  HYPERCHOLESTEROLEMIA (ICD-272.0) Assessment: Unchanged  Committed to diet and exercise, will check in three months.  Orders: [facility]- Est  Level 4 ([address])  Problem # 4:  DIAB W/PERIPH CIRC D/O TYPE II/UNS NOT UNCNTRL (ICD-250.70) Assessment: Unchanged  diet and exercise His updated medication list for this problem includes:    Aspirin 81 Mg Tbec (Aspirin) ..... One daily    Cozaar 100 Mg Tabs (Losartan potassium) .Marland Kitchen... Take 1 tablet by mouth once a day    Metformin Hcl 1000 Mg Tabs (Metformin hcl) .Marland Kitchen... Take 1 tablet by mouth twice a day  Orders: [facility]- Est  Level 4 ([address])  Problem # 5:  CLAUDICATION, INTERMITTENT (ICD-443.9) Assessment: Unchanged  Orders: [facility]- Est  Level 4 ([address])  Complete Medication List: 1)  Aspirin 81 Mg Tbec (Aspirin) .... One daily 2)  Cozaar 100 Mg Tabs (Losartan potassium) .... Take 1 tablet by mouth once a day 3)  Hydrochlorothiazide 25 Mg Tabs (Hydrochlorothiazide) .... Take 1 tablet by mouth once a  day 4)  Lorazepam 0.5 Mg Tabs (Lorazepam) .Marland Kitchen.. 1 tablet by mouth twice a day 5)  Metformin Hcl 1000 Mg Tabs (Metformin hcl) .... Take 1 tablet by mouth twice a day 6)  Synthroid 50 Mcg Tabs (Levothyroxine sodium) .... One by mouth daily 7)  Triamcinolone Acetonide 0.1 % Oint (Triamcinolone acetonide) .... Apply 1 a small amount to skin twice a day.  disp 60 gram tube 8)  Amlodipine Besylate 10 Mg Tabs (Amlodipine besylate) .... Once daily 9)  Propranolol Hcl 10 Mg Tabs (Propranolol hcl) .... One by mouth three times a day (help for high blood pressure and for tremor) 10)  Proair Hfa 108 (90 Base) Mcg/act Aers (Albuterol sulfate) .... Two puffs four times per day as needed wheezing. 11)  Furosemide 20 Mg Tabs (Furosemide) .... Take 1  tab by mouth every day 12)  Clotrimazole-betamethasone 1-0.05 % Crea (Clotrimazole-betamethasone) .... Apply small amount two times a day disp 30 gram tube  Other Orders: A1C-[facility] ([address]) Prescriptions: CLOTRIMAZOLE-BETAMETHASONE 1-0.05 % CREA (CLOTRIMAZOLE-BETAMETHASONE) apply small amount two times a day Disp 30 gram tube  #30 x 1   Entered and Authorized by:   Doralee Albino MD   Signed by:   Doralee Albino MD on 08/13/2010   Method used:   [facility]        [facility] [id number]       [address]       [place], [place]  [address]       Ph: [phone] or [phone]       Fax: [phone]   RxID:   [id number]    Orders Added: 1)  A1C-[facility] [83036] 2)  [facility]- Est  Level 4 [03474]     Prevention & Chronic Care Immunizations   Influenza vaccine: Fluvax MCR  (07/23/2010)   Influenza vaccine due: 06/13/2011    Tetanus booster: 09/21/2008: given   Tetanus booster due: 09/21/2018    Pneumococcal vaccine: Pneumovax  (06/14/2009)   Pneumococcal vaccine due: None    H. zoster vaccine: Not documented  Colorectal Screening   Hemoccult: Done.  (05/13/2003)   Hemoccult due: Not Indicated    Colonoscopy: normal   (01/14/2009)   Colonoscopy due: 01/15/2019  Other Screening   PSA: sees [facility]  (09/21/2008)   PSA due due: Not Indicated   Smoking status: never  (08/13/2010)  Diabetes Mellitus   HgbA1C: 7.8  (08/13/2010)   HgbA1C action/deferral: Ordered  (08/13/2010)   Hemoglobin A1C due: 02/11/2008    Eye exam: normal  (08/21/2008)   Eye exam due: 08/2009    Foot exam: yes  (07/23/2010)   High risk foot: Not documented   Foot care education: Not documented    Urine microalbumin/creatinine ratio: Not documented   Urine microalbumin action/deferral: Not indicated    Diabetes flowsheet reviewed?: Yes   Progress toward A1C goal: Improved  Lipids   Total Cholesterol: 219  (12/18/2009)   Lipid panel action/deferral: Lipid Panel ordered   LDL: 150  (12/18/2009)   LDL Direct: 127  (01/04/2009)   HDL: 49  (12/18/2009)   Triglycerides: 99  (12/18/2009)    SGOT (AST): 21  (06/24/2010)   SGPT (ALT): 20  (06/24/2010)   Alkaline phosphatase: 75  (06/24/2010)   Total bilirubin: 0.5  (06/24/2010)    Lipid flowsheet reviewed?: Yes   Progress toward LDL goal: Unchanged  Hypertension   Last Blood Pressure: 165 / 98  (08/13/2010)   Serum creatinine: 0.95  (06/24/2010)   Serum potassium 4.0  (06/24/2010)    Hypertension flowsheet reviewed?: Yes   Progress toward BP goal: Unchanged  Self-Management Support :   Personal Goals (by the next clinic visit) :     Personal A1C goal: 7  (06/14/2009)     Personal blood pressure goal: 130/80  (06/14/2009)     Personal LDL goal: 100  (06/14/2009)    Diabetes self-management support: Written self-care plan  (02/19/2010)    Hypertension self-management support: Written self-care plan  (02/19/2010)    Lipid self-management support: Written self-care plan  (02/19/2010)    Nursing Instructions: HgbA1C today (see order)   Laboratory Results   Blood Tests   Date/Time Received: August 13, 2010 4:09 PM  Date/Time Reported: August 13, 2010  4:42 PM   HGBA1C: 7.8%   (Normal Range: Non-Diabetic -  3-6%   Control Diabetic - 6-8%)  Comments: ...............test performed by......Marland KitchenBonnie A. [place], MLS (ASCP)cm

## 2010-11-11 NOTE — Assessment & Plan Note (Signed)
Summary: cold,tcb   Vital Signs:  Patient profile:   77 year old male Height:      71.5 inches Weight:      218.9 pounds BMI:     30.21 Temp:     98.7 degrees F oral Pulse rate:   65 / minute BP sitting:   143 / 87  (left arm) Cuff size:   regular  Vitals Entered By: Gladstone Pih (Feb 19, 2010 10:16 AM) CC: C/O cold Is Patient Diabetic? Yes Did you bring your meter with you today? No Pain Assessment Patient in pain? no        Primary Care Provider:  Doralee Albino MD  CC:  C/O cold.  History of Present Illness: Has springtime allergies and gets bronchitis every 2-3 years.  Cough and wheezing at night.  Sick for 1 week.  No documented fever.  Did have some sweating at night.  Cough productive of yellow sputum.   Pre peak flow = 100 Post peak flow =160 No hx of asthma  Habits & Providers  Alcohol-Tobacco-Diet     Tobacco Status: never  Current Medications (verified): 1)  Aspirin 81 Mg  Tbec (Aspirin) .... One Daily 2)  Cozaar 100 Mg Tabs (Losartan Potassium) .... Take 1 Tablet By Mouth Once A Day 3)  Hydrochlorothiazide 25 Mg Tabs (Hydrochlorothiazide) .... Take 1 Tablet By Mouth Once A Day 4)  Lorazepam 0.5 Mg Tabs (Lorazepam) .Marland Kitchen.. 1 Tablet By Mouth Twice A Day 5)  Metformin Hcl 1000 Mg Tabs (Metformin Hcl) .... Take 1 Tablet By Mouth Twice A Day 6)  Synthroid 50 Mcg  Tabs (Levothyroxine Sodium) .... One By Mouth Daily 7)  Triamcinolone Acetonide 0.1 % Oint (Triamcinolone Acetonide) .... Apply 1 A Small Amount To Skin Twice A Day.  Disp 60 Gram Tube 8)  Amlodipine Besylate 10 Mg  Tabs (Amlodipine Besylate) .... Once Daily 9)  Propranolol Hcl 10 Mg Tabs (Propranolol Hcl) .... One By Mouth Three Times A Day (Help For High Blood Pressure and For Tremor) 10)  Cephalexin 500 Mg Caps (Cephalexin) .... Take One Tab By Mouth Three Times A Day X 10 Days 11)  Azithromycin 250 Mg  Tabs (Azithromycin) .... 2 By  Mouth Today and Then 1 Daily For 4 Days 12)  Proair Hfa 108 (90  Base) Mcg/act Aers (Albuterol Sulfate) .... Two Puffs Four Times Per Day As Needed Wheezing.  Allergies (verified): 1)  Tetracycline Hcl (Tetracycline Hcl) 2)  Amoxicillin (Amoxicillin) 3)  Erythromycin Ethylsuccinate 4)  Sulfamethoxazole (Sulfamethoxazole) 5)  Simvastatin 6)  Zetia (Ezetimibe)  Past History:  Past medical, surgical, family and social histories (including risk factors) reviewed, and no changes noted (except as noted below).  Past Medical History: Reviewed history from 01/28/2010 and no changes required. carotid ultrasound 2/07: Rt nl: Lt<50% stenosis,  ETT OK 02/24/06,  finished external beam radiation to prostate 07/00, gold seeds in prostate 09/00,  thyroid radiation for hyperthyroidism diabetes  Past Surgical History: Reviewed history from 12/09/2006 and no changes required. 08/04/06 ldl: 80 direct - 08/20/2006, ABI: 0.85 mild PAD - 02/05/2005, Barium Enema -, cardiac cath 01/00 normal -, cardiolyte no ischemia, EF=43% - 10/26/2000, CT - Head -, CXR -, ECG -, echo EF=55-65% - 02/16/2005, thyroid scan -, US - Abdominal -, Xray - Spine -  Family History: Reviewed history from 12/09/2006 and no changes required. CAD  Social History: Reviewed history from 12/09/2006 and no changes required. smokes 3-4 cigarettes/day quit 10/2000; non drinker; married; Paediatric nurse, owns business; very  regular exercise  Physical Exam  General:  Well-developed,well-nourished,in no acute distress; alert,appropriate and cooperative throughout examination Lungs:  No resp distress at rest.  Does have diffuse exp wheeze.  Pre peak flow=100.  Post albuterol peak flow=160.  Less wheeze post Rx.  Crackles Lt base.   Impression & Recommendations:  Problem # 1:  BRONCHIAL PNEUMONIA (ICD-485) by exam The following medications were removed from the medication list:    Cephalexin 500 Mg Caps (Cephalexin) .Marland Kitchen... Take one tab by mouth three times a day x 10 days His updated medication list for this  problem includes:    Azithromycin 250 Mg Tabs (Azithromycin) .Marland Kitchen... 2 by  mouth today and then 1 daily for 4 days  Orders: CXR- 2view (CXR) Nebulizer Tx (48546) Albuterol Sulfate Sol 1mg  unit dose (E7035) [facility]- [facility] (94150) [facility]- Est Level  3 (00938)  Complete Medication List: 1)  Aspirin 81 Mg Tbec (Aspirin) .... One daily 2)  Cozaar 100 Mg Tabs (Losartan potassium) .... Take 1 tablet by mouth once a day 3)  Hydrochlorothiazide 25 Mg Tabs (Hydrochlorothiazide) .... Take 1 tablet by mouth once a day 4)  Lorazepam 0.5 Mg Tabs (Lorazepam) .Marland Kitchen.. 1 tablet by mouth twice a day 5)  Metformin Hcl 1000 Mg Tabs (Metformin hcl) .... Take 1 tablet by mouth twice a day 6)  Synthroid 50 Mcg Tabs (Levothyroxine sodium) .... One by mouth daily 7)  Triamcinolone Acetonide 0.1 % Oint (Triamcinolone acetonide) .... Apply 1 a small amount to skin twice a day.  disp 60 gram tube 8)  Amlodipine Besylate 10 Mg Tabs (Amlodipine besylate) .... Once daily 9)  Propranolol Hcl 10 Mg Tabs (Propranolol hcl) .... One by mouth three times a day (help for high blood pressure and for tremor) 10)  Azithromycin 250 Mg Tabs (Azithromycin) .... 2 by  mouth today and then 1 daily for 4 days 11)  Proair Hfa 108 (90 Base) Mcg/act Aers (Albuterol sulfate) .... Two puffs four times per day as needed wheezing.  Patient Instructions: 1)  Please schedule a follow-up appointment in 2 weeks.  Prescriptions: PROAIR HFA 108 (90 BASE) MCG/ACT AERS (ALBUTEROL SULFATE) two puffs four times per day as needed wheezing.  #1 x 2   Entered and Authorized by:   Doralee Albino MD   Signed by:   Doralee Albino MD on 02/19/2010   Method used:   [facility]        [facility] [facility]       [address]       [place], [place]  [address]       Ph: [phone] or [phone]       Fax: [phone]   RxID:   [id number] AZITHROMYCIN 250 MG  TABS (AZITHROMYCIN) 2 by  mouth today and then 1 daily for 4 days  #6 x 0    Entered and Authorized by:   Doralee Albino MD   Signed by:   Doralee Albino MD on 02/19/2010   Method used:   [facility]        [facility] [facility]       [address]       [place], [place]  [address]       Ph: [phone] or [phone]       Fax: [phone]   RxID:   [id number]     Prevention & Chronic Care Immunizations   Influenza vaccine: Fluvax MCR  (07/31/2009)   Influenza vaccine due: 08/07/2008  Tetanus booster: 09/21/2008: given   Tetanus booster due: 09/21/2018    Pneumococcal vaccine: Pneumovax  (06/14/2009)   Pneumococcal vaccine due: None    H. zoster vaccine: Not documented  Colorectal Screening   Hemoccult: Done.  (05/13/2003)   Hemoccult due: Not Indicated    Colonoscopy: normal  (01/14/2009)   Colonoscopy due: 01/15/2019  Other Screening   PSA: sees [facility]  (09/21/2008)   PSA due due: Not Indicated   Smoking status: never  (02/19/2010)  Diabetes Mellitus   HgbA1C: 7.6  (12/18/2009)   Hemoglobin A1C due: 02/11/2008    Eye exam: normal  (08/21/2008)   Eye exam due: 08/2009    Foot exam: yes  (06/14/2009)   High risk foot: Not documented   Foot care education: Not documented    Urine microalbumin/creatinine ratio: Not documented   Urine microalbumin action/deferral: Not indicated    Diabetes flowsheet reviewed?: Yes   Progress toward A1C goal: Unchanged  Lipids   Total Cholesterol: 219  (12/18/2009)   Lipid panel action/deferral: Lipid Panel ordered   LDL: 150  (12/18/2009)   LDL Direct: 127  (01/04/2009)   HDL: 49  (12/18/2009)   Triglycerides: 99  (12/18/2009)    SGOT (AST): 23  (12/18/2009)   SGPT (ALT): 20  (12/18/2009)   Alkaline phosphatase: 74  (12/18/2009)   Total bilirubin: 0.4  (12/18/2009)    Lipid flowsheet reviewed?: Yes   Progress toward LDL goal: Unchanged  Hypertension   Last Blood Pressure: 143 / 87  (02/19/2010)   Serum creatinine: 0.88  (12/18/2009)   Serum potassium 3.4   (12/18/2009)    Hypertension flowsheet reviewed?: Yes   Progress toward BP goal: Unchanged  Self-Management Support :   Personal Goals (by the next clinic visit) :     Personal A1C goal: 7  (06/14/2009)     Personal blood pressure goal: 130/80  (06/14/2009)     Personal LDL goal: 100  (06/14/2009)    Diabetes self-management support: Written self-care plan  (02/19/2010)   Diabetes care plan printed    Hypertension self-management support: Written self-care plan  (02/19/2010)   Hypertension self-care plan printed.    Lipid self-management support: Written self-care plan  (02/19/2010)   Lipid self-care plan printed. Peak flow pre Neb 100.Marland KitchenGladstone Pih  Feb 19, 2010 11:34 AM    Medication Administration  Medication # 1:    Medication: Albuterol Sulfate Sol 1mg  unit dose    Diagnosis: BRONCHIAL PNEUMONIA (ICD-485)    Dose: 2.5mg /62ml    Route: inhaled    Exp Date: 06/2011    Lot #: [id number]    Mfr: nephron    Patient tolerated medication without complications    Given by: Gladstone Pih (Feb 19, 2010 11:32 AM)  Orders Added: 1)  CXR- 2view [CXR] 2)  Nebulizer Tx [84696] 3)  Albuterol Sulfate Sol 1mg  unit dose [J7613] 4)  [facility]- [facility] [94150] 5)  [facility]- Est Level  3 [29528]

## 2010-11-11 NOTE — Progress Notes (Signed)
Summary: refill  Phone Note Refill Request Call back at Work Phone [phone] Message from:  Patient  Refills Requested: Medication #1:  PROPRANOLOL HCL 10 MG TABS one by mouth three times a day (help for high blood pressure and for tremor)  Medication #2:  SYNTHROID 50 MCG  TABS one by mouth daily [facility] [address]  Initial call taken by: De Nurse,  August 29, 2010 9:22 AM  Follow-up for Phone Call        Done Follow-up by: Doralee Albino MD,  August 29, 2010 11:27 AM    New/Updated Medications: SYNTHROID 50 MCG  TABS (LEVOTHYROXINE SODIUM) one by mouth daily PROPRANOLOL HCL 10 MG TABS (PROPRANOLOL HCL) one by mouth three times a day (help for high blood pressure and for tremor) Prescriptions: SYNTHROID 50 MCG  TABS (LEVOTHYROXINE SODIUM) one by mouth daily  #30 x 12   Entered and Authorized by:   Doralee Albino MD   Signed by:   Doralee Albino MD on 08/29/2010   Method used:   [facility]        [facility] [facility])       [address]       [place], [place]  [address]       Ph: [phone] or [phone]       Fax: [phone]   RxID:   [id number] PROPRANOLOL HCL 10 MG TABS (PROPRANOLOL HCL) one by mouth three times a day (help for high blood pressure and for tremor)  #90 x 12   Entered and Authorized by:   Doralee Albino MD   Signed by:   Doralee Albino MD on 08/29/2010   Method used:   Electronically to        [facility] (retail)       [address]       [place], [place]  [address]       Ph: [phone] or [phone]       Fax: [phone]   RxID:   [id number]

## 2010-11-11 NOTE — Progress Notes (Signed)
Summary: phn msg & refill  Phone Note Refill Request Call back at Work Phone [phone] Call back at [phone] Message from:  Patient  Refills Requested: Medication #1:  COZAAR 100 MG TABS Take 1 tablet by mouth once a day [facility]  Initial call taken by: De Nurse,  August 14, 2010 2:24 PM Caller: Patient Summary of Call: pt is requesting to speak w/ Dr Rexene Edison asap Initial call taken by: De Nurse,  August 14, 2010 2:23 PM  Follow-up for Phone Call        Refill sent Follow-up by: Doralee Albino MD,  August 15, 2010 8:38 AM    New/Updated Medications: COZAAR 100 MG TABS (LOSARTAN POTASSIUM) Take 1 tablet by mouth once a day Prescriptions: COZAAR 100 MG TABS (LOSARTAN POTASSIUM) Take 1 tablet by mouth once a day  #90 x 3   Entered and Authorized by:   Doralee Albino MD   Signed by:   Doralee Albino MD on 08/15/2010   Method used:   Electronically to        [facility] (retail)       [address]       [place], [place]  [address]       Ph: [phone] or [phone]       Fax: [phone]   RxID:   [id number]

## 2010-11-11 NOTE — Letter (Signed)
Summary: *Referral Letter  [facility]  [address]   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) 3)  PROSTATE CANCER (ICD-185) 4)  NEPHROLITHIASIS (ICD-592.0) 5)  HYPOTHYROIDISM, UNSPECIFIED (ICD-244.9) 6)  HYPERTENSION, BENIGN SYSTEMIC (ICD-401.1) 7)  HYPERCHOLESTEROLEMIA (ICD-272.0) 8)  HEARING LOSS NOS OR DEAFNESS (ICD-389.9) 9)  DIABETES MELLITUS II, UNCOMPLICATED (ICD-250.00) 10)  CLAUDICATION, INTERMITTENT (ICD-443.9) 11)  CERVICAL SPINE DISORDER, NOS (ICD-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) 12)  ANXIETY (ICD-300.00)   Current Medications: 1)  AMARYL 4 MG TABS (GLIMEPIRIDE) Take 1 tablet by mouth once a day 2)  BAYER ASPIRIN 325 MG TABS (ASPIRIN) Take 1 tablet by mouth once a day 3)  COZAAR 100 MG TABS (LOSARTAN POTASSIUM) Take 1 tablet by mouth once a day 4)  FUROSEMIDE 20 MG TABS (FUROSEMIDE) Take 1 tablet by mouth once a day 5)  HYDROCHLOROTHIAZIDE 25 MG TABS (HYDROCHLOROTHIAZIDE) Take 1 tablet by mouth once a day 6)  LORAZEPAM 0.5 MG TABS (LORAZEPAM) 1 tablet by mouth twice a day 7)  METFORMIN HCL 1000 MG TABS (METFORMIN HCL) Take 1 tablet by mouth twice a day 8)  METOPROLOL TARTRATE 50 MG TABS (METOPROLOL TARTRATE) Take 1 tablet by mouth twice a day 9)  SYNTHROID 50 MCG  TABS (LEVOTHYROXINE SODIUM) one by mouth daily 10)  TRIAMCINOLONE ACETONIDE 0.1 % OINT (TRIAMCINOLONE ACETONIDE) Apply 1 a small amount to skin twice a day 11)  WELCHOL 625 MG  TABS (COLESEVELAM HCL) 3 pills twice twice daily   Past Medical History: 1)  carotid ultrasound 2/07: Rt nl: Lt<50% stenosis, 2)   ETT OK 02/24/06,  3)   finished external beam radiation to prostate 07/00, gold seeds in prostate 09/00, 4)   thyroid radiation for hyperthyroidism   Thank you again for agreeing to see our patient; please contact us if you have any further questions or need additional information.  Sincerely,  Doralee Albino MD

## 2010-11-11 NOTE — Progress Notes (Signed)
  Phone Note Outgoing Call   Call placed by: Hensel Summary of Call: Called and told Lt arm pain is probably from neck problems.  No intervention for now.  Call if pain is worse and will medicate. Told about pancreating finding of ultrasound.  Will get CT abd, pancreatic protocol as suggested.  He will get copy of recent blood work at Dr. Kenna Gilbert office to [place].  Need recent creat for contrast. Initial call taken by: Doralee Albino MD,  July 24, 2010 11:04 AM  Follow-up for Phone Call        Recent labs faxed over. BUN 16 and Creatinine 0.95, radioloy informed placed back in MD box. Per Rad tech order needs to be CT with and without contrast. Order changed. Appt scheduled for 10/18 at 1030 at [facility], patient wife informed pt will pick up contrast tomm or monday from radiology. Order faxed to [id number] Follow-up by: Garen Grams LPN,  July 24, 2010 3:43 PM     Appended Document:  Procedure has been approved, Berkley Harvey #[id number]

## 2010-11-11 NOTE — Assessment & Plan Note (Signed)
Summary: f/u,df   Vital Signs:  Patient profile:   77 year old male Height:      71.5 inches Weight:      217.5 pounds BMI:     30.02 Temp:     97.7 degrees F oral Pulse rate:   61 / minute BP sitting:   165 / 92  (left arm) Cuff size:   large  Vitals Entered By: Gladstone Pih (December 18, 2009 3:24 PM)  Serial Vital Signs/Assessments:  Time      Position  BP       Pulse  Resp  Temp     By                     148/88                         Doralee Albino MD  CC: F/U Is Patient Diabetic? Yes Did you bring your meter with you today? No Pain Assessment Patient in pain? no        Primary Care Provider:  Doralee Albino MD  CC:  F/U.  History of Present Illness: Wt up 11 lbs.  Bp noted to be up.  Seems down.  Not doing any exercise.  Eating fast food.  He has been embarrissed to come in because he knew he had been neglecting his health.  Interested in getting back on track.    Habits & Providers  Alcohol-Tobacco-Diet     Tobacco Status: never  Current Medications (verified): 1)  Aspirin 81 Mg  Tbec (Aspirin) .... One Daily 2)  Cozaar 100 Mg Tabs (Losartan Potassium) .... Take 1 Tablet By Mouth Once A Day 3)  Hydrochlorothiazide 25 Mg Tabs (Hydrochlorothiazide) .... Take 1 Tablet By Mouth Once A Day 4)  Lorazepam 0.5 Mg Tabs (Lorazepam) .Marland Kitchen.. 1 Tablet By Mouth Twice A Day 5)  Metformin Hcl 1000 Mg Tabs (Metformin Hcl) .... Take 1 Tablet By Mouth Twice A Day 6)  Synthroid 50 Mcg  Tabs (Levothyroxine Sodium) .... One By Mouth Daily 7)  Triamcinolone Acetonide 0.1 % Oint (Triamcinolone Acetonide) .... Apply 1 A Small Amount To Skin Twice A Day.  Disp 60 Gram Tube 8)  Amlodipine Besylate 10 Mg  Tabs (Amlodipine Besylate) .... Once Daily 9)  Propranolol Hcl 10 Mg Tabs (Propranolol Hcl) .... One By Mouth Three Times A Day (Help For High Blood Pressure and For Tremor)  Allergies (verified): 1)  Tetracycline Hcl (Tetracycline Hcl) 2)  Amoxicillin (Amoxicillin) 3)  Erythromycin  Ethylsuccinate 4)  Sulfamethoxazole (Sulfamethoxazole)  Past History:  Past medical, surgical, family and social histories (including risk factors) reviewed, and no changes noted (except as noted below).  Past Medical History: Reviewed history from 12/27/2006 and no changes required. carotid ultrasound 2/07: Rt nl: Lt<50% stenosis,  ETT OK 02/24/06,  finished external beam radiation to prostate 07/00, gold seeds in prostate 09/00,  thyroid radiation for hyperthyroidism  Past Surgical History: Reviewed history from 12/09/2006 and no changes required. 08/04/06 ldl: 80 direct - 08/20/2006, ABI: 0.85 mild PAD - 02/05/2005, Barium Enema -, cardiac cath 01/00 normal -, cardiolyte no ischemia, EF=43% - 10/26/2000, CT - Head -, CXR -, ECG -, echo EF=55-65% - 02/16/2005, thyroid scan -, US - Abdominal -, Xray - Spine -  Family History: Reviewed history from 12/09/2006 and no changes required. CAD  Social History: Reviewed history from 12/09/2006 and no changes required. smokes 3-4 cigarettes/day quit 10/2000; non drinker; married;  Paediatric nurse, owns business; very regular exerciseSmoking Status:  never  Review of Systems  The patient denies anorexia, chest pain, dyspnea on exertion, abdominal pain, depression, and unusual weight change.    Physical Exam  General:  Well-developed,well-nourished,in no acute distress; alert,appropriate and cooperative throughout examination.   Mouth:  Oral mucosa and oropharynx without lesions or exudates.  Teeth in good repair. Neck:  No deformities, masses, or tenderness noted. Lungs:  Normal respiratory effort, chest expands symmetrically. Lungs are clear to auscultation, no crackles or wheezes. Heart:  Normal rate and regular rhythm. S1 and S2 normal without gallop, murmur, click, rub or other extra sounds. Abdomen:  Bowel sounds positive,abdomen soft and non-tender without masses, organomegaly or hernias noted. Extremities:  No clubbing, cyanosis, edema, or  deformity noted with normal full range of motion of all joints.     Impression & Recommendations:  Problem # 1:  HYPERTENSION, BENIGN SYSTEMIC (ICD-401.1) Assessment Deteriorated  His updated medication list for this problem includes:    Cozaar 100 Mg Tabs (Losartan potassium) .Marland Kitchen... Take 1 tablet by mouth once a day    Hydrochlorothiazide 25 Mg Tabs (Hydrochlorothiazide) .Marland Kitchen... Take 1 tablet by mouth once a day    Amlodipine Besylate 10 Mg Tabs (Amlodipine besylate) ..... Once daily    Propranolol Hcl 10 Mg Tabs (Propranolol hcl) ..... One by mouth three times a day (help for high blood pressure and for tremor)  Orders: Comp Met-[facility] (14782-95621) [facility]- Est  Level 4 (30865)  Problem # 2:  HYPERCHOLESTEROLEMIA (ICD-272.0)  Time to check labs  Orders: Lipid-[facility] (0011001100) [facility]- Est  Level 4 (78469)  Problem # 3:  DIABETES MELLITUS II, UNCOMPLICATED (ICD-250.00) Assessment: Deteriorated  His updated medication list for this problem includes:    Aspirin 81 Mg Tbec (Aspirin) ..... One daily    Cozaar 100 Mg Tabs (Losartan potassium) .Marland Kitchen... Take 1 tablet by mouth once a day    Metformin Hcl 1000 Mg Tabs (Metformin hcl) .Marland Kitchen... Take 1 tablet by mouth twice a day  Orders: A1C-[facility] (62952) [facility]- Est  Level 4 (84132)  Complete Medication List: 1)  Aspirin 81 Mg Tbec (Aspirin) .... One daily 2)  Cozaar 100 Mg Tabs (Losartan potassium) .... Take 1 tablet by mouth once a day 3)  Hydrochlorothiazide 25 Mg Tabs (Hydrochlorothiazide) .... Take 1 tablet by mouth once a day 4)  Lorazepam 0.5 Mg Tabs (Lorazepam) .Marland Kitchen.. 1 tablet by mouth twice a day 5)  Metformin Hcl 1000 Mg Tabs (Metformin hcl) .... Take 1 tablet by mouth twice a day 6)  Synthroid 50 Mcg Tabs (Levothyroxine sodium) .... One by mouth daily 7)  Triamcinolone Acetonide 0.1 % Oint (Triamcinolone acetonide) .... Apply 1 a small amount to skin twice a day.  disp 60 gram tube 8)  Amlodipine Besylate 10 Mg Tabs (Amlodipine besylate) ....  Once daily 9)  Propranolol Hcl 10 Mg Tabs (Propranolol hcl) .... One by mouth three times a day (help for high blood pressure and for tremor)  Patient Instructions: 1)  Get back on your diet and exercise program! 2)  I will call with lab results.   3)  See me in 3 months   Prevention & Chronic Care Immunizations   Influenza vaccine: Fluvax MCR  (07/31/2009)   Influenza vaccine due: 08/07/2008    Tetanus booster: 09/21/2008: given   Tetanus booster due: 09/21/2018    Pneumococcal vaccine: Pneumovax  (06/14/2009)   Pneumococcal vaccine due: None    H. zoster vaccine: Not documented  Colorectal Screening  Hemoccult: Done.  (05/13/2003)   Hemoccult due: Not Indicated    Colonoscopy: normal  (01/14/2009)   Colonoscopy due: 01/15/2019  Other Screening   PSA: sees Evans  (09/21/2008)   PSA due due: Not Indicated   Smoking status: never  (12/18/2009)  Diabetes Mellitus   HgbA1C: 7.6  (12/18/2009)   Hemoglobin A1C due: 02/11/2008    Eye exam: normal  (08/21/2008)   Eye exam due: 08/2009    Foot exam: yes  (06/14/2009)   High risk foot: Not documented   Foot care education: Not documented    Urine microalbumin/creatinine ratio: Not documented   Urine microalbumin action/deferral: Not indicated    Diabetes flowsheet reviewed?: Yes   Progress toward A1C goal: Unchanged  Lipids   Total Cholesterol: 260  (05/16/2007)   Lipid panel action/deferral: Lipid Panel ordered   LDL: 171  (05/16/2007)   LDL Direct: 127  (01/04/2009)   HDL: 51  (05/16/2007)   Triglycerides: 189  (05/16/2007)    SGOT (AST): 27  (01/04/2009)   SGPT (ALT): 18  (01/04/2009) CMP ordered    Alkaline phosphatase: 74  (01/04/2009)   Total bilirubin: 0.5  (01/04/2009)    Lipid flowsheet reviewed?: Yes   Progress toward LDL goal: Unchanged  Hypertension   Last Blood Pressure: 165 / 92  (12/18/2009)   Serum creatinine: 1.04  (05/09/2009)   Serum potassium 3.9  (05/09/2009) CMP ordered      Hypertension flowsheet reviewed?: Yes   Progress toward BP goal: Deteriorated  Self-Management Support :   Personal Goals (by the next clinic visit) :     Personal A1C goal: 7  (06/14/2009)     Personal blood pressure goal: 130/80  (06/14/2009)     Personal LDL goal: 100  (06/14/2009)    Diabetes self-management support: Written self-care plan  (07/31/2009)    Hypertension self-management support: Written self-care plan  (07/31/2009)    Lipid self-management support: Written self-care plan  (07/31/2009)   Laboratory Results   Blood Tests   Date/Time Received: December 18, 2009 4:11 PM  Date/Time Reported: December 18, 2009 5:19 PM   HGBA1C: 7.6%   (Normal Range: Non-Diabetic - 3-6%   Control Diabetic - 6-8%)  Comments: ...............test performed by......Marland KitchenBonnie A. [place]az[place], MLS (ASCP)cm

## 2010-11-13 NOTE — Progress Notes (Signed)
  Phone Note Refill Request   Refills Requested: Medication #1:  HYDROCHLOROTHIAZIDE 25 MG TABS Take 1 tablet by mouth once a day Initial call taken by: Abundio Miu,  September 29, 2010 10:49 AM  Follow-up for Phone Call        Done Follow-up by: Doralee Albino MD,  September 29, 2010 1:49 PM    New/Updated Medications: HYDROCHLOROTHIAZIDE 25 MG TABS (HYDROCHLOROTHIAZIDE) Take 1 tablet by mouth once a day Prescriptions: HYDROCHLOROTHIAZIDE 25 MG TABS (HYDROCHLOROTHIAZIDE) Take 1 tablet by mouth once a day  #90 x 3   Entered and Authorized by:   Doralee Albino MD   Signed by:   Doralee Albino MD on 09/29/2010   Method used:   [facility] to        [facility]  [address] (retail)       [address]       [place], [place]  [address]       Ph: [phone] or [phone]       Fax: [phone]   RxID:   [id number]

## 2010-11-13 NOTE — Progress Notes (Signed)
Summary: Phn Msg  Phone Note Call from Patient Call back at Work Phone [phone] Call back at [phone]   Reason for Call: Talk to Doctor Summary of Call: just left poiatrist & they suggested pt get an oral fungus fighter, wants MD to call him. Initial call taken by: Knox Royalty,  September 25, 2010 11:34 AM  Follow-up for Phone Call        I have tried on mult occaisions.  This time left message. Follow-up by: Doralee Albino MD,  September 26, 2010 2:10 PM  Additional Follow-up for Phone Call Additional follow up Details #1::        Please call patient back on the work ph# number Additional Follow-up by: Abundio Miu,  September 26, 2010 2:28 PM    Additional Follow-up for Phone Call Additional follow up Details #2::    Will try 3 weeks of terbinafine.  Not longer so I don't plan to test LFTs Follow-up by: Doralee Albino MD,  September 26, 2010 5:22 PM  New/Updated Medications: TERBINAFINE HCL 250 MG TABS (TERBINAFINE HCL) one by mouth daily for three weeks Prescriptions: TERBINAFINE HCL 250 MG TABS (TERBINAFINE HCL) one by mouth daily for three weeks  #21 x 0   Entered and Authorized by:   Doralee Albino MD   Signed by:   Doralee Albino MD on 09/26/2010   Method used:   [facility]        [facility] (retail)       [address]       [place], [place]  [address]       Ph: [phone] or [phone]       Fax: [phone]   RxID:   [id number]

## 2010-11-13 NOTE — Progress Notes (Signed)
Summary: Referral  Phone Note Call from Patient Call back at Work Phone [phone] Call back at [phone]    Reason for Call: Referral Summary of Call: pt asking to be referred to a podiatrist, has a lot of problems with his feet, thinks he has an internal fungus & is anxious to get this checked out since he is diabetic.  Initial call taken by: Knox Royalty,  September 24, 2010 8:39 AM  Follow-up for Phone Call        [facility] to refer to Dr. Orlene Och.  Follow-up by: Doralee Albino MD,  September 24, 2010 11:46 AM  Additional Follow-up for Phone Call Additional follow up Details #1::        LVM with information of doctor Additional Follow-up by: Jimmy Footman, CMA,  September 24, 2010 2:12 PM

## 2010-11-13 NOTE — Progress Notes (Signed)
  Phone Note Refill Request Call back at [phone]   Refills Requested: Medication #1:  AMLODIPINE BESYLATE 10 MG  TABS once daily Initial call taken by: Abundio Miu,  October 08, 2010 4:05 PM  Follow-up for Phone Call        Rx sent Follow-up by: Doralee Albino MD,  October 08, 2010 4:51 PM    New/Updated Medications: AMLODIPINE BESYLATE 10 MG  TABS (AMLODIPINE BESYLATE) once daily Prescriptions: AMLODIPINE BESYLATE 10 MG  TABS (AMLODIPINE BESYLATE) once daily  #90 x 3   Entered and Authorized by:   Doralee Albino MD   Signed by:   Doralee Albino MD on 10/08/2010   Method used:   [facility]        [facility] [facility]       [address]       [place], [place]  [address]       Ph: [phone] or [phone]       Fax: [phone]   RxID:   [id number]

## 2010-11-17 ENCOUNTER — Other Ambulatory Visit: Payer: Self-pay | Admitting: Oncology

## 2010-11-17 ENCOUNTER — Encounter (HOSPITAL_BASED_OUTPATIENT_CLINIC_OR_DEPARTMENT_OTHER): Payer: Medicare PPO | Admitting: Oncology

## 2010-11-17 ENCOUNTER — Encounter: Payer: Self-pay | Admitting: Cardiovascular Disease

## 2010-11-17 DIAGNOSIS — C8590 Non-Hodgkin lymphoma, unspecified, unspecified site: Secondary | ICD-10-CM

## 2010-11-17 DIAGNOSIS — C8589 Other specified types of non-Hodgkin lymphoma, extranodal and solid organ sites: Secondary | ICD-10-CM

## 2010-11-17 LAB — CBC WITH DIFFERENTIAL/PLATELET
BASO%: 0.1 % (ref 0.0–2.0)
Basophils Absolute: 0 10*3/uL (ref 0.0–0.1)
EOS%: 1 % (ref 0.0–7.0)
HCT: 40.7 % (ref 38.4–49.9)
HGB: 13.5 g/dL (ref 13.0–17.1)
LYMPH%: 34.6 % (ref 14.0–49.0)
MCH: 30.8 pg (ref 27.2–33.4)
MCHC: 33.1 g/dL (ref 32.0–36.0)
MONO#: 0.4 10*3/uL (ref 0.1–0.9)
NEUT#: 3.2 10*3/uL (ref 1.5–6.5)
RDW: 15.2 % — ABNORMAL HIGH (ref 11.0–14.6)
WBC: 5.7 10*3/uL (ref 4.0–10.3)
lymph#: 2 10*3/uL (ref 0.9–3.3)

## 2010-11-17 LAB — COMPREHENSIVE METABOLIC PANEL
ALT: 17 U/L (ref 0–53)
AST: 26 U/L (ref 0–37)
Alkaline Phosphatase: 73 U/L (ref 39–117)
CO2: 31 mEq/L (ref 19–32)
Calcium: 9.9 mg/dL (ref 8.4–10.5)
Creatinine, Ser: 0.99 mg/dL (ref 0.40–1.50)
Glucose, Bld: 149 mg/dL — ABNORMAL HIGH (ref 70–99)
Sodium: 137 mEq/L (ref 135–145)
Total Bilirubin: 0.7 mg/dL (ref 0.3–1.2)
Total Protein: 7.9 g/dL (ref 6.0–8.3)

## 2010-11-17 LAB — LACTATE DEHYDROGENASE: LDH: 161 U/L (ref 94–250)

## 2010-11-17 LAB — URIC ACID: Uric Acid, Serum: 5.2 mg/dL (ref 4.0–7.8)

## 2010-11-18 ENCOUNTER — Other Ambulatory Visit: Payer: Self-pay | Admitting: Oncology

## 2010-11-18 DIAGNOSIS — C8590 Non-Hodgkin lymphoma, unspecified, unspecified site: Secondary | ICD-10-CM

## 2010-11-18 LAB — BETA 2 MICROGLOBULIN, SERUM: Beta-2 Microglobulin: 1.31 mg/L (ref 1.01–1.73)

## 2010-11-18 LAB — HEPATITIS B CORE ANTIBODY, TOTAL: Hep B Core Total Ab: NEGATIVE

## 2010-11-18 LAB — HEPATITIS B SURFACE ANTIBODY,QUALITATIVE: Hep B S Ab: NEGATIVE

## 2010-11-20 ENCOUNTER — Ambulatory Visit (HOSPITAL_COMMUNITY)
Admission: RE | Admit: 2010-11-20 | Discharge: 2010-11-20 | Disposition: A | Discharge status: Home / Self Care | Payer: Medicare PPO | Source: Ambulatory Visit | Attending: Oncology | Admitting: Oncology

## 2010-11-20 ENCOUNTER — Encounter (HOSPITAL_COMMUNITY): Payer: Self-pay

## 2010-11-20 DIAGNOSIS — I10 Essential (primary) hypertension: Secondary | ICD-10-CM | POA: Insufficient documentation

## 2010-11-20 DIAGNOSIS — Z923 Personal history of irradiation: Secondary | ICD-10-CM | POA: Insufficient documentation

## 2010-11-20 DIAGNOSIS — C61 Malignant neoplasm of prostate: Secondary | ICD-10-CM | POA: Insufficient documentation

## 2010-11-20 DIAGNOSIS — Z7982 Long term (current) use of aspirin: Secondary | ICD-10-CM | POA: Insufficient documentation

## 2010-11-20 DIAGNOSIS — C8589 Other specified types of non-Hodgkin lymphoma, extranodal and solid organ sites: Secondary | ICD-10-CM | POA: Insufficient documentation

## 2010-11-20 DIAGNOSIS — Z79899 Other long term (current) drug therapy: Secondary | ICD-10-CM | POA: Insufficient documentation

## 2010-11-20 DIAGNOSIS — E119 Type 2 diabetes mellitus without complications: Secondary | ICD-10-CM | POA: Insufficient documentation

## 2010-11-20 DIAGNOSIS — C8590 Non-Hodgkin lymphoma, unspecified, unspecified site: Secondary | ICD-10-CM

## 2010-11-20 HISTORY — DX: Malignant (primary) neoplasm, unspecified: C80.1

## 2010-11-20 LAB — GLUCOSE, CAPILLARY: Glucose-Capillary: 144 mg/dL — ABNORMAL HIGH (ref 70–99)

## 2010-11-20 MED ORDER — FLUDEOXYGLUCOSE F - 18 (FDG) INJECTION
17.9000 | Freq: Once | INTRAVENOUS | Status: AC | PRN
  Administered 2010-11-20: 17.9 via INTRAVENOUS

## 2010-11-24 ENCOUNTER — Other Ambulatory Visit (HOSPITAL_COMMUNITY)
Admission: RE | Admit: 2010-11-24 | Discharge: 2010-11-24 | Disposition: A | Discharge status: Home / Self Care | Payer: Medicare PPO | Source: Ambulatory Visit | Attending: Oncology | Admitting: Oncology

## 2010-11-24 ENCOUNTER — Encounter: Payer: Self-pay | Admitting: Cardiovascular Disease

## 2010-11-24 ENCOUNTER — Other Ambulatory Visit: Payer: Self-pay | Admitting: Oncology

## 2010-11-24 ENCOUNTER — Encounter (HOSPITAL_BASED_OUTPATIENT_CLINIC_OR_DEPARTMENT_OTHER): Payer: Medicare PPO | Admitting: Oncology

## 2010-11-24 DIAGNOSIS — C8590 Non-Hodgkin lymphoma, unspecified, unspecified site: Secondary | ICD-10-CM

## 2010-11-24 DIAGNOSIS — D649 Anemia, unspecified: Secondary | ICD-10-CM

## 2010-11-24 DIAGNOSIS — C8589 Other specified types of non-Hodgkin lymphoma, extranodal and solid organ sites: Secondary | ICD-10-CM

## 2010-11-24 LAB — CBC
Platelets: 177 10*3/uL (ref 150–400)
RBC: 4.08 MIL/uL — ABNORMAL LOW (ref 4.22–5.81)
RDW: 14.4 % (ref 11.5–15.5)
WBC: 6.8 10*3/uL (ref 4.0–10.5)

## 2010-11-24 LAB — DIFFERENTIAL
Basophils Relative: 0 % (ref 0–1)
Eosinophils Absolute: 0.1 10*3/uL (ref 0.0–0.7)
Eosinophils Relative: 1 % (ref 0–5)
Neutrophils Relative %: 52 % (ref 43–77)

## 2010-11-25 ENCOUNTER — Other Ambulatory Visit: Payer: Self-pay | Admitting: Gastroenterology

## 2010-11-27 ENCOUNTER — Ambulatory Visit (HOSPITAL_COMMUNITY)
Admission: RE | Admit: 2010-11-27 | Discharge: 2010-11-27 | Disposition: A | Discharge status: Home / Self Care | Payer: Medicare PPO | Source: Ambulatory Visit | Attending: Oncology | Admitting: Oncology

## 2010-11-27 ENCOUNTER — Other Ambulatory Visit: Payer: Self-pay | Admitting: Oncology

## 2010-11-27 DIAGNOSIS — D497 Neoplasm of unspecified behavior of endocrine glands and other parts of nervous system: Secondary | ICD-10-CM

## 2010-11-27 DIAGNOSIS — C8589 Other specified types of non-Hodgkin lymphoma, extranodal and solid organ sites: Secondary | ICD-10-CM | POA: Insufficient documentation

## 2010-11-27 DIAGNOSIS — C8590 Non-Hodgkin lymphoma, unspecified, unspecified site: Secondary | ICD-10-CM

## 2010-11-27 LAB — GLUCOSE, CAPILLARY: Glucose-Capillary: 162 mg/dL — ABNORMAL HIGH (ref 70–99)

## 2010-11-27 NOTE — Consult Note (Signed)
[facility]: [facility]   Imported By: De Nurse 11/14/2010 12:17:30  _____________________________________________________________________  External Attachment:    Type:   Image     Comment:   External Document

## 2010-11-30 ENCOUNTER — Inpatient Hospital Stay (HOSPITAL_COMMUNITY): Admission: RE | Admit: 2010-11-30 | Payer: Medicare PPO | Source: Ambulatory Visit

## 2010-12-02 ENCOUNTER — Other Ambulatory Visit: Payer: Self-pay | Admitting: Oncology

## 2010-12-02 DIAGNOSIS — D497 Neoplasm of unspecified behavior of endocrine glands and other parts of nervous system: Secondary | ICD-10-CM

## 2010-12-03 ENCOUNTER — Ambulatory Visit (HOSPITAL_COMMUNITY)
Admission: RE | Admit: 2010-12-03 | Discharge: 2010-12-03 | Disposition: A | Discharge status: Home / Self Care | Payer: Medicare PPO | Source: Ambulatory Visit | Attending: Oncology | Admitting: Oncology

## 2010-12-03 DIAGNOSIS — C801 Malignant (primary) neoplasm, unspecified: Secondary | ICD-10-CM | POA: Insufficient documentation

## 2010-12-03 DIAGNOSIS — Z01818 Encounter for other preprocedural examination: Secondary | ICD-10-CM | POA: Insufficient documentation

## 2010-12-03 DIAGNOSIS — I059 Rheumatic mitral valve disease, unspecified: Secondary | ICD-10-CM | POA: Insufficient documentation

## 2010-12-03 DIAGNOSIS — D497 Neoplasm of unspecified behavior of endocrine glands and other parts of nervous system: Secondary | ICD-10-CM

## 2010-12-03 DIAGNOSIS — I10 Essential (primary) hypertension: Secondary | ICD-10-CM | POA: Insufficient documentation

## 2010-12-03 DIAGNOSIS — Z01810 Encounter for preprocedural cardiovascular examination: Secondary | ICD-10-CM

## 2010-12-03 DIAGNOSIS — E119 Type 2 diabetes mellitus without complications: Secondary | ICD-10-CM | POA: Insufficient documentation

## 2010-12-03 MED ORDER — GADOBENATE DIMEGLUMINE 529 MG/ML IV SOLN: 15.0000 mL | Freq: Once | INTRAVENOUS | Status: DC | PRN

## 2010-12-04 ENCOUNTER — Encounter (HOSPITAL_BASED_OUTPATIENT_CLINIC_OR_DEPARTMENT_OTHER): Payer: Medicare PPO | Admitting: Oncology

## 2010-12-04 DIAGNOSIS — Z5111 Encounter for antineoplastic chemotherapy: Secondary | ICD-10-CM

## 2010-12-04 DIAGNOSIS — C8589 Other specified types of non-Hodgkin lymphoma, extranodal and solid organ sites: Secondary | ICD-10-CM

## 2010-12-05 ENCOUNTER — Encounter (HOSPITAL_BASED_OUTPATIENT_CLINIC_OR_DEPARTMENT_OTHER): Payer: Medicare PPO | Admitting: Oncology

## 2010-12-05 DIAGNOSIS — C8589 Other specified types of non-Hodgkin lymphoma, extranodal and solid organ sites: Secondary | ICD-10-CM

## 2010-12-08 ENCOUNTER — Encounter: Payer: Self-pay | Admitting: Family Medicine

## 2010-12-08 DIAGNOSIS — C8330 Diffuse large B-cell lymphoma, unspecified site: Secondary | ICD-10-CM | POA: Insufficient documentation

## 2010-12-12 ENCOUNTER — Ambulatory Visit (INDEPENDENT_AMBULATORY_CARE_PROVIDER_SITE_OTHER): Payer: Medicare PPO | Admitting: Family Medicine

## 2010-12-12 ENCOUNTER — Encounter: Payer: Self-pay | Admitting: Family Medicine

## 2010-12-12 VITALS — BP 154/79 | HR 75 | Temp 97.7°F | Ht 73.0 in | Wt 201.7 lb

## 2010-12-12 DIAGNOSIS — K5900 Constipation, unspecified: Secondary | ICD-10-CM | POA: Insufficient documentation

## 2010-12-12 DIAGNOSIS — I10 Essential (primary) hypertension: Secondary | ICD-10-CM

## 2010-12-12 DIAGNOSIS — E1159 Type 2 diabetes mellitus with other circulatory complications: Secondary | ICD-10-CM

## 2010-12-12 MED ORDER — POLYETHYLENE GLYCOL 3350 17 G PO PACK: 17.0000 g | PACK | Freq: Two times a day (BID) | ORAL | Status: AC

## 2010-12-12 NOTE — Patient Instructions (Signed)
Stop taking furosemide I will call with A1C results New prescription at the pharmacy for constipation medicine.

## 2010-12-12 NOTE — Progress Notes (Signed)
  Subjective:    Patient ID: Nicholas Olsen, male    DOB: 1934/04/25, 77 y.o.   MRN: [id number]  HPIConstipation Recently diagnosed with lymphoma starting in stomach.  Under treatment I expect weight loss and better control of diabetes, high chol and hypertension   Review of Systems     Objective:   Physical Exam Wt noted.   Neck without nodes Lungs clear. Cardiac RRR without m or g Abd benign without organomegally Ext no edema       Assessment & Plan:

## 2010-12-15 ENCOUNTER — Other Ambulatory Visit: Payer: Self-pay | Admitting: Oncology

## 2010-12-15 ENCOUNTER — Encounter (HOSPITAL_BASED_OUTPATIENT_CLINIC_OR_DEPARTMENT_OTHER): Payer: Medicare PPO | Admitting: Oncology

## 2010-12-15 DIAGNOSIS — C8589 Other specified types of non-Hodgkin lymphoma, extranodal and solid organ sites: Secondary | ICD-10-CM

## 2010-12-15 LAB — CBC WITH DIFFERENTIAL/PLATELET
BASO%: 0.2 % (ref 0.0–2.0)
Eosinophils Absolute: 0 10*3/uL (ref 0.0–0.5)
LYMPH%: 10.6 % — ABNORMAL LOW (ref 14.0–49.0)
MCHC: 33.1 g/dL (ref 32.0–36.0)
MCV: 91.6 fL (ref 79.3–98.0)
MONO%: 2 % (ref 0.0–14.0)
NEUT#: 16.7 10*3/uL — ABNORMAL HIGH (ref 1.5–6.5)
RBC: 4.12 10*6/uL — ABNORMAL LOW (ref 4.20–5.82)
RDW: 14.5 % (ref 11.0–14.6)
WBC: 19.1 10*3/uL — ABNORMAL HIGH (ref 4.0–10.3)

## 2010-12-15 NOTE — Assessment & Plan Note (Signed)
Good control on current meds with chemo induced wt loss

## 2010-12-15 NOTE — Assessment & Plan Note (Signed)
Add mirilax

## 2010-12-15 NOTE — Assessment & Plan Note (Signed)
Sub optimal control but with likely wt loss from chemo, will leave meds as is.  Discussed healthy diet

## 2010-12-22 ENCOUNTER — Telehealth: Payer: Self-pay | Admitting: Family Medicine

## 2010-12-22 ENCOUNTER — Inpatient Hospital Stay (HOSPITAL_COMMUNITY)
Admission: EM | Admit: 2010-12-22 | Discharge: 2010-12-25 | DRG: 841 | Disposition: A | Discharge status: Home / Self Care | Payer: Medicare PPO | Attending: Internal Medicine | Admitting: Internal Medicine

## 2010-12-22 ENCOUNTER — Emergency Department (HOSPITAL_COMMUNITY): Payer: Medicare PPO

## 2010-12-22 DIAGNOSIS — E039 Hypothyroidism, unspecified: Secondary | ICD-10-CM | POA: Diagnosis present

## 2010-12-22 DIAGNOSIS — Z8546 Personal history of malignant neoplasm of prostate: Secondary | ICD-10-CM

## 2010-12-22 DIAGNOSIS — D62 Acute posthemorrhagic anemia: Secondary | ICD-10-CM | POA: Diagnosis present

## 2010-12-22 DIAGNOSIS — I10 Essential (primary) hypertension: Secondary | ICD-10-CM | POA: Diagnosis present

## 2010-12-22 DIAGNOSIS — E119 Type 2 diabetes mellitus without complications: Secondary | ICD-10-CM | POA: Diagnosis present

## 2010-12-22 DIAGNOSIS — R55 Syncope and collapse: Secondary | ICD-10-CM | POA: Diagnosis present

## 2010-12-22 DIAGNOSIS — C8583 Other specified types of non-Hodgkin lymphoma, intra-abdominal lymph nodes: Principal | ICD-10-CM | POA: Diagnosis present

## 2010-12-22 DIAGNOSIS — K921 Melena: Secondary | ICD-10-CM | POA: Diagnosis present

## 2010-12-22 DIAGNOSIS — E785 Hyperlipidemia, unspecified: Secondary | ICD-10-CM | POA: Diagnosis present

## 2010-12-22 LAB — COMPREHENSIVE METABOLIC PANEL
ALT: 11 U/L (ref 0–53)
AST: 15 U/L (ref 0–37)
Albumin: 3 g/dL — ABNORMAL LOW (ref 3.5–5.2)
CO2: 25 mEq/L (ref 19–32)
Calcium: 8.6 mg/dL (ref 8.4–10.5)
Creatinine, Ser: 1.49 mg/dL (ref 0.4–1.5)
GFR calc Af Amer: 56 mL/min — ABNORMAL LOW (ref >60)
GFR calc non Af Amer: 46 mL/min — ABNORMAL LOW (ref >60)
Sodium: 136 mEq/L (ref 135–145)
Total Protein: 6.5 g/dL (ref 6.0–8.3)

## 2010-12-22 LAB — DIFFERENTIAL
Basophils Relative: 0 % (ref 0–1)
Eosinophils Relative: 0 % (ref 0–5)
Lymphocytes Relative: 11 % — ABNORMAL LOW (ref 12–46)
Lymphs Abs: 2.1 10*3/uL (ref 0.7–4.0)
Monocytes Relative: 4 % (ref 3–12)
Neutro Abs: 16.5 10*3/uL — ABNORMAL HIGH (ref 1.7–7.7)

## 2010-12-22 LAB — CBC
HCT: 25 % — ABNORMAL LOW (ref 39.0–52.0)
Hemoglobin: 8.2 g/dL — ABNORMAL LOW (ref 13.0–17.0)
MCH: 29.4 pg (ref 26.0–34.0)
MCHC: 32.8 g/dL (ref 30.0–36.0)
MCV: 89.6 fL (ref 78.0–100.0)
RBC: 2.79 MIL/uL — ABNORMAL LOW (ref 4.22–5.81)

## 2010-12-22 LAB — URINALYSIS, ROUTINE W REFLEX MICROSCOPIC
Bilirubin Urine: NEGATIVE
Glucose, UA: NEGATIVE mg/dL
Ketones, ur: NEGATIVE mg/dL
Protein, ur: NEGATIVE mg/dL
pH: 5.5 (ref 5.0–8.0)

## 2010-12-22 LAB — PROTIME-INR: Prothrombin Time: 14.9 seconds (ref 11.6–15.2)

## 2010-12-22 LAB — ABO/RH: ABO/RH(D): B POS

## 2010-12-22 NOTE — Telephone Encounter (Signed)
Called.  BPs `90 systolic.  Not eating as much with chemo.  Will stop amlodipine and HCTZ.  Cont losartin and propranolol for now.  Continue to monitor.

## 2010-12-22 NOTE — Telephone Encounter (Signed)
Attempted to call him back but keep getting a busy signal.  Will try again in a few minutes.

## 2010-12-22 NOTE — Telephone Encounter (Signed)
Will route to Dr. Hensel 

## 2010-12-22 NOTE — Telephone Encounter (Signed)
States that his BP has been running low - 93/60 and is concerned about it being too low.  Please advise

## 2010-12-23 ENCOUNTER — Encounter: Payer: Self-pay | Admitting: Home Health Services

## 2010-12-23 ENCOUNTER — Inpatient Hospital Stay (HOSPITAL_COMMUNITY): Payer: Medicare PPO

## 2010-12-23 DIAGNOSIS — R55 Syncope and collapse: Secondary | ICD-10-CM

## 2010-12-23 LAB — COMPREHENSIVE METABOLIC PANEL
Alkaline Phosphatase: 65 U/L (ref 39–117)
BUN: 44 mg/dL — ABNORMAL HIGH (ref 6–23)
Calcium: 8.4 mg/dL (ref 8.4–10.5)
Glucose, Bld: 150 mg/dL — ABNORMAL HIGH (ref 70–99)
Total Protein: 6.4 g/dL (ref 6.0–8.3)

## 2010-12-23 LAB — CARDIAC PANEL(CRET KIN+CKTOT+MB+TROPI)
CK, MB: 1.1 ng/mL (ref 0.3–4.0)
Relative Index: 1.1 (ref 0.0–2.5)
Total CK: 113 U/L (ref 7–232)
Troponin I: 0.03 ng/mL (ref 0.00–0.06)

## 2010-12-23 LAB — CBC
HCT: 31.7 % — ABNORMAL LOW (ref 39.0–52.0)
HCT: 30.6 % — ABNORMAL LOW (ref 39.0–52.0)
Hemoglobin: 10.2 g/dL — ABNORMAL LOW (ref 13.0–17.0)
MCH: 29.3 pg (ref 26.0–34.0)
MCHC: 33.1 g/dL (ref 30.0–36.0)
MCHC: 33.3 g/dL (ref 30.0–36.0)
Platelets: 160 10*3/uL (ref 150–400)
RBC: 3.48 MIL/uL — ABNORMAL LOW (ref 4.22–5.81)
RDW: 14.1 % (ref 11.5–15.5)
RDW: 14.1 % (ref 11.5–15.5)
WBC: 20.5 10*3/uL — ABNORMAL HIGH (ref 4.0–10.5)
WBC: 19.7 10*3/uL — ABNORMAL HIGH (ref 4.0–10.5)

## 2010-12-23 LAB — GLUCOSE, CAPILLARY
Glucose-Capillary: 138 mg/dL — ABNORMAL HIGH (ref 70–99)
Glucose-Capillary: 133 mg/dL — ABNORMAL HIGH (ref 70–99)
Glucose-Capillary: 140 mg/dL — ABNORMAL HIGH (ref 70–99)

## 2010-12-23 LAB — HEMOGLOBIN A1C
Hgb A1c MFr Bld: 7.4 % — ABNORMAL HIGH (ref <5.7)
Mean Plasma Glucose: 166 mg/dL — ABNORMAL HIGH (ref <117)

## 2010-12-23 LAB — OCCULT BLOOD, POC DEVICE: Fecal Occult Bld: POSITIVE

## 2010-12-23 MED ORDER — XENON XE 133 GAS
9.0000 | GAS_FOR_INHALATION | Freq: Once | RESPIRATORY_TRACT | Status: AC | PRN
  Administered 2010-12-23: 9 via RESPIRATORY_TRACT

## 2010-12-23 MED ORDER — TECHNETIUM TO 99M ALBUMIN AGGREGATED
4.9000 | Freq: Once | INTRAVENOUS | Status: AC | PRN
  Administered 2010-12-23: 4.9 via INTRAVENOUS

## 2010-12-23 NOTE — Letter (Signed)
Summary: [facility]: New Pt Evaluation  [facility]: New Pt Evaluation   Imported By: Earl Many 12/12/2010 18:06:14  _____________________________________________________________________  External Attachment:    Type:   Image     Comment:   External Document

## 2010-12-23 NOTE — Progress Notes (Signed)
Summary: [facility]: Office Visit  [facility]: Office Visit   Imported By: Earl Many 12/12/2010 17:30:08  _____________________________________________________________________  External Attachment:    Type:   Image     Comment:   External Document

## 2010-12-24 LAB — CBC
Hemoglobin: 9.8 g/dL — ABNORMAL LOW (ref 13.0–17.0)
MCV: 89.1 fL (ref 78.0–100.0)
Platelets: 200 10*3/uL (ref 150–400)
RBC: 3.36 MIL/uL — ABNORMAL LOW (ref 4.22–5.81)
RBC: 3.31 MIL/uL — ABNORMAL LOW (ref 4.22–5.81)
WBC: 17.8 10*3/uL — ABNORMAL HIGH (ref 4.0–10.5)
WBC: 18.2 10*3/uL — ABNORMAL HIGH (ref 4.0–10.5)

## 2010-12-24 LAB — BASIC METABOLIC PANEL
Calcium: 8.4 mg/dL (ref 8.4–10.5)
Creatinine, Ser: 1.02 mg/dL (ref 0.4–1.5)
GFR calc Af Amer: >60 mL/min (ref >60)
GFR calc non Af Amer: >60 mL/min (ref >60)
Sodium: 142 mEq/L (ref 135–145)

## 2010-12-24 LAB — GLUCOSE, CAPILLARY
Glucose-Capillary: 101 mg/dL — ABNORMAL HIGH (ref 70–99)
Glucose-Capillary: 119 mg/dL — ABNORMAL HIGH (ref 70–99)
Glucose-Capillary: 106 mg/dL — ABNORMAL HIGH (ref 70–99)
Glucose-Capillary: 97 mg/dL (ref 70–99)

## 2010-12-24 LAB — PHOSPHORUS: Phosphorus: 2.3 mg/dL (ref 2.3–4.6)

## 2010-12-25 DIAGNOSIS — C8589 Other specified types of non-Hodgkin lymphoma, extranodal and solid organ sites: Secondary | ICD-10-CM

## 2010-12-25 DIAGNOSIS — D649 Anemia, unspecified: Secondary | ICD-10-CM

## 2010-12-25 LAB — CBC
MCH: 29.7 pg (ref 26.0–34.0)
MCHC: 32.9 g/dL (ref 30.0–36.0)
MCV: 90.2 fL (ref 78.0–100.0)
Platelets: 211 10*3/uL (ref 150–400)
RDW: 14.5 % (ref 11.5–15.5)
WBC: 19.7 10*3/uL — ABNORMAL HIGH (ref 4.0–10.5)

## 2010-12-25 LAB — GLUCOSE, CAPILLARY: Glucose-Capillary: 117 mg/dL — ABNORMAL HIGH (ref 70–99)

## 2010-12-25 LAB — BASIC METABOLIC PANEL
BUN: 13 mg/dL (ref 6–23)
Creatinine, Ser: 0.84 mg/dL (ref 0.4–1.5)
GFR calc non Af Amer: >60 mL/min (ref >60)

## 2010-12-26 ENCOUNTER — Telehealth: Payer: Self-pay | Admitting: Family Medicine

## 2010-12-26 LAB — CROSSMATCH
Antibody Screen: NEGATIVE
Unit division: 0
Unit division: 0
Unit division: 0

## 2010-12-26 MED ORDER — PANTOPRAZOLE SODIUM 40 MG PO TBEC: 40.0000 mg | DELAYED_RELEASE_TABLET | Freq: Two times a day (BID) | ORAL | Status: DC

## 2010-12-26 NOTE — Telephone Encounter (Signed)
Hospitalized with GI bleed.  Followed by Dr. Loreta Ave (GI) and Truett Perna (onc).  Now home.  Has FU appointments next week.  Meds updated.  Will see me in two weeks.

## 2010-12-26 NOTE — Telephone Encounter (Signed)
Wife asking to speak with MD, wants to give update on pts condition.

## 2010-12-26 NOTE — Discharge Summary (Signed)
NAMECRAVEN, CREAN               ACCOUNT NO.:  [id number]  MEDICAL RECORD NO.:  [id number]           PATIENT TYPE:  I  LOCATION:  1440                         FACILITY:  [facility]  PHYSICIAN:  Isidor Holts, M.D.  DATE OF BIRTH:  [birth year]  DATE OF ADMISSION:  12/22/2010 DATE OF DISCHARGE:  12/25/2010                              DISCHARGE SUMMARY   PRIMARY MD:  Santiago Bumpers. Leveda Anna, M.D.  PRIMARY MEDICAL ONCOLOGIST:  Quenton Fetter, M.D.  PRIMARY UROLOGIST:  Jamison Neighbor, M.D.  PRIMARY GASTROENTEROLOGIST:  Anselmo Rod, MD, [facility]  DISCHARGE DIAGNOSES: 1. Non-Hodgkin's lymphoma/gastric involvement, complicated by upper GI     bleed. 2. Acute blood loss anemia, secondary to Non-Hodgkin's lymphoma.     Required transfusion 2 units PRBC. 3. Presyncope/orthostasis secondary to Non-Hodgkin's lymphoma and     acute blood loss anemia. 4. Type 2 diabetes mellitus. 5. Dyslipidemia. 6. Hypothyroidism. 7. Hypertension. 8. Previous history of SVT. 9. History of CA prostate, status post radioactive seed implantation.  DISCHARGE MEDICATIONS: 1. Protonix 40 mg p.o. b.i.d. 2. Chemotherapy regimen per Dr. Truett Perna at [facility]. 3. Cozaar 100 mg p.o. daily. 4. EMLA 1 application transdermally prior to chemotherapy. 5. Levothyroxine 50 mcg p.o. daily. 6. Metformin 1000 mg p.o. b.i.d. 7. Prochlorperazine 10 mg p.o. p.r.n. q. 6 hourly for nausea. 8. Propranolol 10 mg p.o. t.i.d. 9. Vitamin C OTC 1 tablet p.o. daily.  PROCEDURES: 1. Head CT scan, December 22, 2010.  This showed no visible intracranial     abnormality. 2. Chest x-ray, December 22, 2010.  This showed no acute abnormality. 3. Ventilation-perfusion lung scan December 23, 2010, this showed low     likelihood ratio for pulmonary embolism. 4. 2-D echocardiogram December 23, 2010.  This showed normal systolic     function, EF 60% to 65%, Doppler parameters consistent with grade 1     diastolic dysfunction.  There was mild  mitral valve regurgitation.  CONSULTATIONS: 1. Jordan Hawks Elnoria Howard, MD, gastroenterologist. 2. Dr. Rolm Baptise, hematologist/oncologist.  ADMISSION HISTORY:  As in H and P notes of December 22, 2010, dictated by Dr. Massie Maroon.  However, in brief, this is a 77 year old male, with known history of non-Hodgkin lymphoma with gastric involvement, undergoing active chemotherapy under the auspices of Dr. Rolm Baptise, hematologist/oncologist, in addition type 2 diabetes mellitus, hypertension, dyslipidemia, previous history of SVT, hypothyroidism, carcinoma of the prostate, status post radioactive seed implantation, presenting with a presyncopal episode.  On initial evaluation, he was found to have systolic blood pressures ranging in 70s to 90s. Hemoglobin was found to be 8.2.  The patient was admitted for further evaluation, investigation and management.  CLINICAL COURSE: 1. Upper GI bleed.  The patient admitted that he has had black     stools on and off,and now  presents with presyncopal episode with low     systolic blood pressures and orthostatic changes.  Hemoglobin was     found to be 8.2.  He was managed with intravenous fluid resuscitation,     transfused 2 units of PRBC with a satisfactory bump in hemoglobin  to 10.5.  The patient's hemoglobin remained stable thereafter and     although he noticed black stools up till a.m. of December 24, 2010,     there was no further significant drop in hemoglobin and his     hemodynamics remained stable.  As of December 25, 2010, the patient     had no further evidence of continued GI bleed.  He has been     commenced on proton pump inhibitor, strongly urged to avoid NSAIDS,     his low-dose aspirin has been discontinued for now.  GI     consultation was kindly provided by Dr. Jeani Hawking, who has     opined that endoscopy in this situation may indeed exacerbate GI     bleed, secondary to friability of tissues. He recommended close      observation.  The plan is to have the patient follow up closely on     an outpatient basis with GI and should GI bleeding recur, surgical     intervention may need to be considered.  2. Gastric lymphoma.  The patient has non-Hodgkin lymphoma with     gastric involvement.  This is felt to be the culprit for the     patient's GI bleed/acute blood loss anemia, as described above.  He     was seen during this hospitalization by Dr. Mancel Bale who has     opined that surgery may be required; however, should a     conservative approach be elected per GI recommendations, he will     for now, continue the chemotherapy, albeit on an outpatient basis.  3. Hypertension.  The patient was hypotensive at the time of     presentation, secondary to acute blood loss anemia.  However,     following intravenous fluid and blood resuscitation, his     BP normalized and as a matter of fact was slightly elevated at     147/85 mmHg on December 25, 2010.  The patient's antihypertensive     medications have been reinstated.  4. Type 2 diabetes mellitus.  The patient was euglycemic throughout     the course of this hospitalization.  5. History of prostate carcinoma, there were no problems referable to     prostatism.  6. Thyroidism.  The patient continues on preadmission thyroxine     replacement therapy.  DISPOSITION:  The patient was on December 25, 2010 asymptomatic.  There were no new issues.  He was cleared by gastroenterologist for discharge and was therefore discharged accordingly.  ACTIVITY:  As tolerated.  Recommended to increase activity slowly.  DIET:  Heart-healthy/carbohydrate modified.  FOLLOWUP INSTRUCTIONS:  The patient will follow up with Dr. Charna Elizabeth, his primary gastroenterologist on December 30, 2010 at 10:00 a.m. in addition he will follow up with Dr. Mancel Bale, oncologist, on a date to be determined, telephone number [phone].  All this has been communicated to the patient and  spouse and they verbalized understanding.     Isidor Holts, M.D.     CO/MEDQ  D:  12/25/2010  T:  12/26/2010  Job:  [id number]  cc:   William A. Leveda Anna, M.D.  Quenton Fetter, M.D. Fax: [phone]  Jamison Neighbor, M.D. Fax: [phone]  UYQIHK VQQ VZDG, MD, [place].Lat Fax: [phone]  Electronically Signed by Isidor Holts M.D. on 12/26/2010 07:02:12 PM

## 2010-12-31 ENCOUNTER — Encounter: Payer: Self-pay | Admitting: Family Medicine

## 2010-12-31 NOTE — Progress Notes (Signed)
  Subjective:    Patient ID: Nicholas Olsen, male    DOB: 1934-09-04, 77 y.o.   MRN: [id number]  HPI Outside labs Hgb=8.7 on 3/20.    Review of Systems     Objective:   Physical Exam        Assessment & Plan:

## 2011-01-01 ENCOUNTER — Telehealth: Payer: Self-pay | Admitting: Family Medicine

## 2011-01-01 NOTE — Telephone Encounter (Signed)
Results came thru fax - placed in Hensel's box.

## 2011-01-01 NOTE — Telephone Encounter (Signed)
Had his endoscopy this morning and wants to know when Dr Leveda Anna will get results from Dr Loreta Ave?  Has an appt next week w/ Dr Rexene Edison, but wants to know asap.

## 2011-01-02 ENCOUNTER — Encounter (HOSPITAL_BASED_OUTPATIENT_CLINIC_OR_DEPARTMENT_OTHER): Payer: Medicare PPO | Admitting: Oncology

## 2011-01-02 ENCOUNTER — Other Ambulatory Visit: Payer: Self-pay | Admitting: Oncology

## 2011-01-02 DIAGNOSIS — D649 Anemia, unspecified: Secondary | ICD-10-CM

## 2011-01-02 DIAGNOSIS — D500 Iron deficiency anemia secondary to blood loss (chronic): Secondary | ICD-10-CM

## 2011-01-02 DIAGNOSIS — Z5111 Encounter for antineoplastic chemotherapy: Secondary | ICD-10-CM

## 2011-01-02 DIAGNOSIS — C8589 Other specified types of non-Hodgkin lymphoma, extranodal and solid organ sites: Secondary | ICD-10-CM

## 2011-01-02 DIAGNOSIS — Z5112 Encounter for antineoplastic immunotherapy: Secondary | ICD-10-CM

## 2011-01-02 LAB — PSA: PSA: 0.15 ng/mL (ref <=4.00)

## 2011-01-02 LAB — CBC WITH DIFFERENTIAL/PLATELET
BASO%: 0.6 % (ref 0.0–2.0)
Eosinophils Absolute: 0.1 10*3/uL (ref 0.0–0.5)
LYMPH%: 15 % (ref 14.0–49.0)
MCHC: 33.3 g/dL (ref 32.0–36.0)
MCV: 90.1 fL (ref 79.3–98.0)
MONO%: 9.4 % (ref 0.0–14.0)
NEUT%: 74.1 % (ref 39.0–75.0)
Platelets: 356 10*3/uL (ref 140–400)
RBC: 2.73 10*6/uL — ABNORMAL LOW (ref 4.20–5.82)
nRBC: 0 % (ref 0–0)

## 2011-01-02 LAB — COMPREHENSIVE METABOLIC PANEL
ALT: 15 U/L (ref 0–53)
BUN: 11 mg/dL (ref 6–23)
CO2: 27 mEq/L (ref 19–32)
Creatinine, Ser: 0.98 mg/dL (ref 0.40–1.50)
Total Bilirubin: 0.3 mg/dL (ref 0.3–1.2)

## 2011-01-02 NOTE — Telephone Encounter (Signed)
Called.  Recent EGD was normal - so gastric lymphoma has grossly resolved with chemo given thus far.

## 2011-01-03 ENCOUNTER — Encounter (HOSPITAL_BASED_OUTPATIENT_CLINIC_OR_DEPARTMENT_OTHER): Payer: Medicare PPO | Admitting: Oncology

## 2011-01-03 DIAGNOSIS — C8589 Other specified types of non-Hodgkin lymphoma, extranodal and solid organ sites: Secondary | ICD-10-CM

## 2011-01-07 ENCOUNTER — Ambulatory Visit (INDEPENDENT_AMBULATORY_CARE_PROVIDER_SITE_OTHER): Payer: Medicare PPO | Admitting: Family Medicine

## 2011-01-07 ENCOUNTER — Other Ambulatory Visit: Payer: Self-pay | Admitting: Oncology

## 2011-01-07 ENCOUNTER — Encounter (HOSPITAL_BASED_OUTPATIENT_CLINIC_OR_DEPARTMENT_OTHER): Payer: Medicare PPO | Admitting: Oncology

## 2011-01-07 ENCOUNTER — Encounter: Payer: Self-pay | Admitting: Family Medicine

## 2011-01-07 VITALS — BP 132/78 | HR 60 | Temp 98.7°F | Ht 73.0 in | Wt 200.5 lb

## 2011-01-07 DIAGNOSIS — C8330 Diffuse large B-cell lymphoma, unspecified site: Secondary | ICD-10-CM

## 2011-01-07 DIAGNOSIS — I10 Essential (primary) hypertension: Secondary | ICD-10-CM

## 2011-01-07 DIAGNOSIS — C8589 Other specified types of non-Hodgkin lymphoma, extranodal and solid organ sites: Secondary | ICD-10-CM

## 2011-01-07 DIAGNOSIS — K922 Gastrointestinal hemorrhage, unspecified: Secondary | ICD-10-CM | POA: Insufficient documentation

## 2011-01-07 LAB — CBC WITH DIFFERENTIAL/PLATELET
BASO%: 0.1 % (ref 0.0–2.0)
Basophils Absolute: 0 10*3/uL (ref 0.0–0.1)
EOS%: 0.5 % (ref 0.0–7.0)
Eosinophils Absolute: 0.1 10*3/uL (ref 0.0–0.5)
HCT: 25.2 % — ABNORMAL LOW (ref 38.4–49.9)
HGB: 8.4 g/dL — ABNORMAL LOW (ref 13.0–17.1)
LYMPH%: 6 % — ABNORMAL LOW (ref 14.0–49.0)
MCH: 31.1 pg (ref 27.2–33.4)
MCHC: 33.5 g/dL (ref 32.0–36.0)
MCV: 93 fL (ref 79.3–98.0)
MONO#: 0.2 10*3/uL (ref 0.1–0.9)
MONO%: 2 % (ref 0.0–14.0)
NEUT#: 10.1 10*3/uL — ABNORMAL HIGH (ref 1.5–6.5)
NEUT%: 91.4 % — ABNORMAL HIGH (ref 39.0–75.0)
Platelets: 103 10*3/uL — ABNORMAL LOW (ref 140–400)
RBC: 2.72 10*6/uL — ABNORMAL LOW (ref 4.20–5.82)
RDW: 18 % — ABNORMAL HIGH (ref 11.0–14.6)
WBC: 11 10*3/uL — ABNORMAL HIGH (ref 4.0–10.3)
lymph#: 0.7 10*3/uL — ABNORMAL LOW (ref 0.9–3.3)

## 2011-01-07 LAB — HOLD TUBE, BLOOD BANK

## 2011-01-07 MED ORDER — OMEPRAZOLE 40 MG PO CPDR: 40.0000 mg | DELAYED_RELEASE_CAPSULE | Freq: Every day | ORAL | Status: DC

## 2011-01-07 MED ORDER — FERROUS SULFATE 325 (65 FE) MG PO TABS: 325.0000 mg | ORAL_TABLET | Freq: Two times a day (BID) | ORAL | Status: DC

## 2011-01-07 NOTE — Patient Instructions (Signed)
Iron try to take twice a day Omeprozole takes the place of pantoprozole when you run out.   See me in one month. Let me know if leg swelling worsens

## 2011-01-08 NOTE — Assessment & Plan Note (Signed)
Well controled on current meds.  Will hold off starting HCTZ.  Emphasize sodium restriction for leg edema.

## 2011-01-08 NOTE — Assessment & Plan Note (Signed)
Seems to be responding to chemo.  Patient tolerating chemo well

## 2011-01-08 NOTE — Progress Notes (Signed)
  Subjective:    Patient ID: Nicholas Olsen, male    DOB: [birth year], 77 y.o.   MRN: [id number]  HPI  Was hospitalized for GI bleed.  No obvious source found.  Good news was that on repeat EGD, there was no gross evidence of any remaining gastric lymphoma after only on round of chemotherapy.  Feeling better.  Only new complaint is mild fluid retention.    Review of Systems     Objective:   Physical Exam Wt noted Lungs clear Abd no tenderness 1+ bilateral ankle edema       Assessment & Plan:

## 2011-01-08 NOTE — Assessment & Plan Note (Signed)
No evidence of recurrence.  Heme/onc following blood work.  Will start iron and switch pantoprozole to omeprozole.

## 2011-01-13 NOTE — Consult Note (Signed)
NAME:  Nicholas Olsen, Nicholas Olsen               ACCOUNT NO.:  [id number]  MEDICAL RECORD NO.:  [id number]           PATIENT TYPE:  I  LOCATION:  1440                         FACILITY:  [facility]  PHYSICIAN:  Jordan Hawks. Elnoria Howard, MD    DATE OF BIRTH:  1934-08-14  DATE OF CONSULTATION:  12/23/2010 DATE OF DISCHARGE:                                CONSULTATION   REASON FOR CONSULTATION:  GI bleed.  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  The patient stated that he was walking in his door and subsequently when he turned around, he passed out.  He was admitted to the hospital and noted to have a blood pressure ranging from 70 to 90 in the systolic range.  His hemoglobin was checked and it was found to be in the 8 range and subsequently he has received 2 units of packed red blood cells.  Currently, he does feel better.  At this time, he is undergoing treatment for his gastric lymphoma with Dr. Truett Perna and so far appears that he has been tolerating the chemotherapy; however it is causing him to feel very weak.  During the past 10 days, he does report having melena over the time course. Interestingly, at the time of presentation for his lymphoma in January, the patient denied having any episodes of melena.  At that time, he is complaining of abdominal pain.  PAST MEDICAL HISTORY/PAST SURGICAL HISTORY:  Prostate cancer, status post seed implant XRT.  FAMILY HISTORY:  Stomach cancer in 1 sister and Alzheimer disease.  SOCIAL HISTORY:  Negative for alcohol, tobacco, or illicit drug use, although he did quit smoking 2-3 years ago after a 30-pack-year history.  ALLERGIES: 1. MACROLIDES. 2. TETRACYCLINE. 3. PENICILLIN. 4. SULFA.  MEDICATIONS: 1. Vitamin C 500 mg p.o. daily. 2. Protonix drip. 3. Tylenol  650 mg p.o. q.4 h. p.r.n. 4. Zofran 4 mg IV q.6 h. p.r.n.  PHYSICAL EXAMINATION:  VITAL SIGNS:  Blood pressure is 120/76, heart rate is 65, respirations 18, temperature is 97.7. GENERAL:  The patient is in no acute distress, alert, and oriented. HEENT:  Normocephalic, atraumatic.  Extraocular muscles intact. NECK:  Supple.  No lymphadenopathy. LUNGS:  Clear to auscultation bilaterally. CARDIOVASCULAR:  Regular rate and rhythm. ABDOMEN:  Flat, soft, nontender, nondistended. EXTREMITIES:  No clubbing, cyanosis, or edema.  LABORATORY VALUES:  White blood cell count is 19.1, hemoglobin 10.5, MCV is 88.5, platelets are 168.  Glucose 133.  Sodium 139, potassium 3.1, glucose 150, BUN 44, creatinine 1.1, alk phos 65, AST 13, ALT 13, hemoglobin A1c is 7.4.  IMPRESSION: 1. Gastric lymphoma. 2. Gastrointestinal bleed from the lymphoma.  Unfortunately, the patient does feel better.  It appears that he has a baseline hemoglobin that runs in the 12 range and subsequently this bleeding has resulted in significant change as his lowest count has been noted to be in the 8 range.  Overall, the patient does feel better.  At this time, I  feel that the patient has been bleeding from his lymphoma and this may be as a result of his response to the chemotherapy.  This is not an unexpected type of finding.  From a GI standpoint, it is difficult to treat a GI bleeding issue from a lymphoma as the tissue is extremely friable, there  is no particular area that can be adequately treated if it is diffuse bleeding or even if there is a particular site because the substrate is so abnormal, cauterization or hemoclipping of that site would only allow for transient hemostasis.  PLAN: 1. To monitor hemoglobin. 2. Continue the clear liquids for now. 3. To transfuse as necessary.  The patient does have a significant     drop in his hemoglobin again, it would not be unreasonable to     evaluate the patient.  The  decision will need to be made on a daily     basis following his hemoglobin. 4. Continue with Protonix.     Jordan Hawks Elnoria Howard, MD     PDH/MEDQ  D:  12/23/2010  T:  12/24/2010  Job:  [id number]  Electronically Signed by Jeani Hawking MD on 01/13/2011 08:51:53 AM

## 2011-01-20 ENCOUNTER — Encounter: Payer: Medicare PPO | Admitting: Oncology

## 2011-01-22 NOTE — H&P (Signed)
NAME:  Nicholas Olsen, Nicholas Olsen NO.:  [id number]  MEDICAL RECORD NO.:  [id number]           PATIENT TYPE:  E  LOCATION:  WLED                         FACILITY:  [facility]  PHYSICIAN:  Massie Maroon, MD        DATE OF BIRTH:  [birth year]  DATE OF ADMISSION:  12/22/2010 DATE OF DISCHARGE:                             HISTORY & PHYSICAL   CHIEF COMPLAINT:  Syncope.  HISTORY OF PRESENT ILLNESS:  77 year old male with a history of hypertension, hyperlipidemia, SVT, type 2 diabetes, non-Hodgkin lymphoma, apparently had just finished locking his car door, when he turned around and felt weak and nearly passed out.  It was generalized weakness.  He felt lightheaded and the patient denies any headache, fever, chills, chest pain, palpitations, nausea, vomiting.  The patient does note that he has had black stool over the past week and had stomach aches or problems.  He has felt lightheaded periodically over this past week and his blood pressure has been low.  His wife states that his blood pressure has been 90/60 and 70/40 at home and, therefore, he was told to come to the ER for evaluation.  In the ER, orthostatics have not yet been done.  The patient does have a low-grade temperature of 100.3. Urinalysis and chest x-ray are pending as well as CT brain.  Cardiac markers are also pending as well as D-dimer.  The patient does appear to be dehydrated or prerenal and fairly anemic with a hemoglobin of 8.2. The patient will be admitted for workup of near syncope/syncope and mild renal insufficiency and anemia and possible upper GI bleed.  PAST MEDICAL HISTORY: 1. Hypertension. 2. Hyperlipidemia. 3. Type 2 diabetes. 4. History of SVT. 5. Prostate cancer. 6. Non-Hodgkin lymphoma. 7. ? Pituitary microadenoma on MRI, December 03, 2010. 8. Diverticulosis on colonoscopy October 22, 2003, as well as internal     hemorrhoids.  PAST SURGICAL HISTORY: 1. Seed implant/XRT for prostate  cancer. 2. Hemorrhoid surgery in 1988.  SOCIAL HISTORY:  The patient does not smoke or drink.  He quit smoking about 2-3 years ago and smoked 1 pack per day for 30 years.  He is a non- combat veteran who was in [place] at the time of the [place] War.  FAMILY HISTORY:  One sister had stomach cancer.  Mother died at age 9 of Alzheimer disease.  Father died at 48 of complications of diabetes. He had an ulcer and a leg amputation.  ALLERGIES: 1. MACROLIDES cause rash. 2. TETRACYCLINE causes ? 3. PENICILLIN causes syncope. 4. SULFA causes ?  MEDICATIONS: 1. Prochlorperazine 10 mg p.o. q.6 h. p.r.n. 2. EMLA transderm one application before chemo, chemotherapeutic     regimen. 3. Vitamin C OTC one p.o. daily. 4. Cozaar 100 mg p.o. daily. 5. Propranolol 10 mg p.o. t.i.d. 6. Metformin 1000 mg p.o. b.i.d. 7. Levothyroxine 50 mcg p.o. daily. 8. Enteric-coated aspirin 81 mg p.o. daily.  REVIEW OF SYSTEMS:  Negative for all 10-organ systems except for pertinent positives stated above.  PHYSICAL EXAMINATION:  VITAL SIGNS:  Temperature 100.3, pulse 106, blood pressure 87/64, pulse ox is  100% on room air. HEENT:  Pale conjunctivae.  Pupils 1.5 mm, symmetric.  Direct, consensual, and near reflexes intact.  Mucous membranes moist. NECK:  No JVD, no bruit. HEART:  Regular rate and rhythm, S1, S2.  No murmurs, gallops, or rubs. LUNGS:  Clear to auscultation bilaterally. ABDOMEN:  Soft, nontender, nondistended.  Positive bowel sounds. EXTREMITIES:  No cyanosis, clubbing, or edema. SKIN:  No rashes. LYMPH NODES:  No adenopathy. NEUROLOGIC EXAM:  Nonfocal.  Cranial nerves II through XII intact. Reflexes 2+, symmetric, diffuse with downgoing toes bilaterally, motor strength 5/5 in all 4 extremities, pinprick intact.  LABORATORY DATA:  WBC 19.4, hemoglobin 8.2, platelet count 210.  Sodium 136, potassium 3.5, BUN 60, creatinine 1.49, glucose 197, AST 15, ALT 11.  INR 1.15.  ASSESSMENT AND  PLAN: 1. Near syncope:  Check orthostatics.  Check CT brain stat.  Check     CPK, CK-MB, troponin I q.6 h. x3.  The patient is placed on     telemetry.  We will check a D-dimer and if positive obtain a V/Q     scan.  We will check a carotid ultrasound, cardiac 2-D echo, and     obtain a GI consult for rule out of gastrointestinal bleed. 2. ? Upper gastrointestinal bleed:  Stop aspirin.  GI consult as     stated above.  The patient will be placed on Protonix 80 mg IV x1     and then 8 mg IV q. hour.  The patient will be typed and crossed     for 3 units of packed red blood cells.  We will transfuse each unit     over 3 hours and give Lasix 20 mg IV between the 2nd and 3rd unit.     The patient will be made n.p.o. for possible endoscopy tomorrow. 3. Diabetes type 2:  Fingerstick blood sugars q.4 h., NovoLog     sensitive sliding scale. 4. Hypothyroidism:  Continue levothyroxine. 5. Hypertension:  Discontinue losartan for now.  Continue propranolol     if systolic blood pressure is greater than 100 and heart rate is     greater than 60. 6. Low-grade fever:  We will obtain a chest x-ray and a urinalysis and     await results.  If either are positive, obviously we will need to     treat with antibiotics. 7. Deep venous thrombosis prophylaxis:  SCDs.     Massie Maroon, MD     JYK/MEDQ  D:  12/22/2010  T:  12/22/2010  Job:  [id number]  cc:   William A. Leveda Anna, M.D.  Quenton Fetter, M.D. Fax: [phone]  Jamison Neighbor, M.D. Fax: [phone]  QIONGE XBM WUXL, MD, [facility] Fax: [phone]  Electronically Signed by Pearson Grippe MD on 01/22/2011 01:38:10 AM

## 2011-01-23 ENCOUNTER — Other Ambulatory Visit: Payer: Self-pay | Admitting: Oncology

## 2011-01-23 ENCOUNTER — Encounter (HOSPITAL_BASED_OUTPATIENT_CLINIC_OR_DEPARTMENT_OTHER): Payer: Medicare PPO | Admitting: Oncology

## 2011-01-23 DIAGNOSIS — J984 Other disorders of lung: Secondary | ICD-10-CM

## 2011-01-23 DIAGNOSIS — C8589 Other specified types of non-Hodgkin lymphoma, extranodal and solid organ sites: Secondary | ICD-10-CM

## 2011-01-23 DIAGNOSIS — Z5112 Encounter for antineoplastic immunotherapy: Secondary | ICD-10-CM

## 2011-01-23 DIAGNOSIS — Z5111 Encounter for antineoplastic chemotherapy: Secondary | ICD-10-CM

## 2011-01-23 LAB — COMPREHENSIVE METABOLIC PANEL
ALT: 15 U/L (ref 0–53)
CO2: 28 mEq/L (ref 19–32)
Calcium: 9 mg/dL (ref 8.4–10.5)
Chloride: 103 mEq/L (ref 96–112)
Creatinine, Ser: 0.89 mg/dL (ref 0.40–1.50)
Glucose, Bld: 88 mg/dL (ref 70–99)
Total Bilirubin: 0.4 mg/dL (ref 0.3–1.2)
Total Protein: 6.9 g/dL (ref 6.0–8.3)

## 2011-01-23 LAB — CBC WITH DIFFERENTIAL/PLATELET
Basophils Absolute: 0.1 10*3/uL (ref 0.0–0.1)
Eosinophils Absolute: 0 10*3/uL (ref 0.0–0.5)
HCT: 28 % — ABNORMAL LOW (ref 38.4–49.9)
HGB: 9.2 g/dL — ABNORMAL LOW (ref 13.0–17.1)
LYMPH%: 9.9 % — ABNORMAL LOW (ref 14.0–49.0)
MCV: 92.7 fL (ref 79.3–98.0)
MONO#: 1.3 10*3/uL — ABNORMAL HIGH (ref 0.1–0.9)
MONO%: 8.9 % (ref 0.0–14.0)
NEUT#: 11.9 10*3/uL — ABNORMAL HIGH (ref 1.5–6.5)
NEUT%: 80.3 % — ABNORMAL HIGH (ref 39.0–75.0)
Platelets: 303 10*3/uL (ref 140–400)
WBC: 14.8 10*3/uL — ABNORMAL HIGH (ref 4.0–10.3)
nRBC: 1 % — ABNORMAL HIGH (ref 0–0)

## 2011-01-24 ENCOUNTER — Encounter: Payer: Medicare PPO | Admitting: Oncology

## 2011-02-13 ENCOUNTER — Other Ambulatory Visit: Payer: Self-pay | Admitting: Oncology

## 2011-02-13 ENCOUNTER — Ambulatory Visit (HOSPITAL_COMMUNITY)
Admission: RE | Admit: 2011-02-13 | Discharge: 2011-02-13 | Disposition: A | Discharge status: Home / Self Care | Payer: Medicare PPO | Source: Ambulatory Visit | Attending: Oncology | Admitting: Oncology

## 2011-02-13 ENCOUNTER — Encounter (HOSPITAL_BASED_OUTPATIENT_CLINIC_OR_DEPARTMENT_OTHER): Payer: Medicare PPO | Admitting: Oncology

## 2011-02-13 DIAGNOSIS — M7989 Other specified soft tissue disorders: Secondary | ICD-10-CM

## 2011-02-13 DIAGNOSIS — C8590 Non-Hodgkin lymphoma, unspecified, unspecified site: Secondary | ICD-10-CM

## 2011-02-13 DIAGNOSIS — Z5111 Encounter for antineoplastic chemotherapy: Secondary | ICD-10-CM

## 2011-02-13 DIAGNOSIS — Z5112 Encounter for antineoplastic immunotherapy: Secondary | ICD-10-CM

## 2011-02-13 LAB — COMPREHENSIVE METABOLIC PANEL
ALT: 12 U/L (ref 0–53)
AST: 21 U/L (ref 0–37)
Albumin: 3.5 g/dL (ref 3.5–5.2)
CO2: 27 mEq/L (ref 19–32)
Calcium: 9.3 mg/dL (ref 8.4–10.5)
Chloride: 100 mEq/L (ref 96–112)
Potassium: 3.5 mEq/L (ref 3.5–5.3)
Total Protein: 6.6 g/dL (ref 6.0–8.3)

## 2011-02-13 LAB — CBC WITH DIFFERENTIAL/PLATELET
BASO%: 0.6 % (ref 0.0–2.0)
Eosinophils Absolute: 0 10*3/uL (ref 0.0–0.5)
HCT: 31 % — ABNORMAL LOW (ref 38.4–49.9)
MCHC: 32.9 g/dL (ref 32.0–36.0)
MONO#: 1.1 10*3/uL — ABNORMAL HIGH (ref 0.1–0.9)
NEUT#: 12.6 10*3/uL — ABNORMAL HIGH (ref 1.5–6.5)
Platelets: 303 10*3/uL (ref 140–400)
RBC: 3.31 10*6/uL — ABNORMAL LOW (ref 4.20–5.82)
WBC: 15.6 10*3/uL — ABNORMAL HIGH (ref 4.0–10.3)
lymph#: 1.7 10*3/uL (ref 0.9–3.3)
nRBC: 0 % (ref 0–0)

## 2011-02-14 ENCOUNTER — Encounter (HOSPITAL_BASED_OUTPATIENT_CLINIC_OR_DEPARTMENT_OTHER): Payer: Medicare PPO | Admitting: Oncology

## 2011-02-14 DIAGNOSIS — C8589 Other specified types of non-Hodgkin lymphoma, extranodal and solid organ sites: Secondary | ICD-10-CM

## 2011-02-17 ENCOUNTER — Telehealth: Payer: Self-pay | Admitting: Family Medicine

## 2011-02-17 MED ORDER — AMLODIPINE BESYLATE 10 MG PO TABS: 10.0000 mg | ORAL_TABLET | Freq: Every day | ORAL | Status: DC

## 2011-02-17 NOTE — Telephone Encounter (Signed)
Called- BP up.  He had been on HCTZ and amlodipine prior to starting chemo.  Stopped when BP fell with chemo.  Now BP climbing back up.  Restart amlodipine.

## 2011-02-17 NOTE — Telephone Encounter (Signed)
Has an appt on Friday and is wanting to talk to Hensel about his elevated BP - needs to talk to him today

## 2011-02-20 ENCOUNTER — Encounter: Payer: Self-pay | Admitting: Family Medicine

## 2011-02-20 ENCOUNTER — Ambulatory Visit (INDEPENDENT_AMBULATORY_CARE_PROVIDER_SITE_OTHER): Payer: Medicare PPO | Admitting: Family Medicine

## 2011-02-20 VITALS — BP 143/84 | HR 84 | Ht 73.0 in | Wt 186.9 lb

## 2011-02-20 DIAGNOSIS — I10 Essential (primary) hypertension: Secondary | ICD-10-CM

## 2011-02-23 NOTE — Assessment & Plan Note (Signed)
Back at goal on amlodipine.  Continue to monitor as he returns to his pretreatment norms.

## 2011-02-23 NOTE — Progress Notes (Signed)
  Subjective:    Patient ID: Nicholas Olsen, male    DOB: 02/01/1934, 77 y.o.   MRN: [id number]  HPI Shermon is doing well with chemo for his lymphoma.  Initially, with wt loss from chemo, BP went down and I held two chronic meds.  This week, BP began creeping back up.  After phone call, I restarted his amlodipine.  BP down from earlier this week.  Tolerating amlodipine well.  Blood work is being followed by onc.    Review of Systems     Objective:   Physical Exam Lungs clear Cardiac RRR without M or g        Assessment & Plan:

## 2011-02-24 NOTE — Assessment & Plan Note (Signed)
[facility] CARDIOLOGY OFFICE NOTE   Nicholas Olsen, Nicholas Olsen                      MRN:          [id number]  DATE:09/22/2010                            DOB:          09-Feb-1934    PRIMARY CARE PHYSICIAN:  Santiago Bumpers. Hensel, MD   Nicholas Olsen is a 77 year old gentleman who is here today for a followup  visit.  He has the following problem list:  1. History of supraventricular tachycardia.  2. No coronary artery disease per cardiac catheterization in June      2010, at [facility].  3. Hypertension.  4. Hyperlipidemia.  5. Type 2 diabetes currently diet controlled.  6. Previous history of prostate cancer which has been treated.   INTERVAL HISTORY:  Nicholas Olsen is here today for followup visit.  He was  last seen by me in January 2011, at [facility].  Overall, he has had no significant issues since then.  He denies any  chest pain, dyspnea, palpitations, dizziness, syncope or presyncope.  His blood pressure has been reasonably controlled.  He has not been  exercising liquor regularly and has gained some weight.   MEDICATIONS:  1. Synthroid 50 mcg once daily.  2. Aspirin 81 mg once daily.  3. Propranolol 10 mg 3 times daily.  4. Amlodipine 2.5 mg once daily.  5. Cozaar 100 mg once daily.  6. Hydrochlorothiazide 25 mg once daily.   ALLERGIES:  PENICILLIN, MYCINS and TETRACYCLINE.   SOCIAL HISTORY:  Negative for smoking.  He is a previous smoker.  There  is no history of alcohol or recreational drug use.   FAMILY HISTORY:  Negative for premature coronary artery disease.   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   PHYSICAL EXAMINATION:  GENERAL:  The patient appears to be at his stated  age and in no acute distress.  VITAL SIGNS:  Weight is 213.4 pounds, blood pressure is 128/78, pulse is  62, oxygen saturation is 97% on room air.  HEENT:  Normocephalic,  atraumatic.  NECK:  No JVD or carotid bruits.  RESPIRATORY:  Normal respiratory effort with no use of accessory  muscles.  Auscultation reveals normal breath sounds.  CARDIOVASCULAR:  Normal PMI.  Normal S1 and S2 with no gallops.  There is a 1/6 systolic  ejection murmur at the aortic area with no radiation.  ABDOMEN:  Benign, nontender, nondistended.  EXTREMITIES:  With no clubbing, cyanosis or edema.  PSYCHIATRIC:  He is alert, oriented x3 with normal mood and affect.  SKIN:  Warm and dry with no rash.   Electrocardiogram was performed which showed sinus rhythm with frequent  premature atrial complexes.  There are no significant ST or T-wave  changes.   IMPRESSION:  1. Hypertension:  His blood pressure is well controlled with current      medications.  No changes will be made today.  I advised him to      engage in a regular exercise program especially that he has gained      some weight since his last  visit.  He is to continue low-sodium      diet.  2. Remote history of supraventricular tachycardia:  This has not been      an issue lately.  He is taking propranolol for his hypertension      which should help with this as well.  He had an echocardiogram done      most recently in May of this year which showed overall normal LV      systolic function, mild left ventricular hypertrophy and diastolic      dysfunction.  There was mild aortic sclerosis without significant      stenosis.  The patient will follow up in 9 months or earlier if      needed.     Lorine Bears, MD  Electronically Signed    MA/MedQ  DD: 09/22/2010  DT: 09/23/2010  Job #: [id number]

## 2011-02-27 ENCOUNTER — Other Ambulatory Visit (HOSPITAL_COMMUNITY): Payer: Medicare PPO

## 2011-02-27 NOTE — Op Note (Signed)
NAME:  Nicholas Olsen, Nicholas Olsen                         ACCOUNT NO.:  [id number]   MEDICAL RECORD NO.:  [id number]                   PATIENT TYPE:  AMB   LOCATION:  ENDO                                 FACILITY:  [facility]   PHYSICIAN:  Anselmo Rod, M.D.               DATE OF BIRTH:  25-Oct-1933   DATE OF PROCEDURE:  10/22/2003  DATE OF DISCHARGE:                                 OPERATIVE REPORT   PROCEDURE PERFORMED:  Screening colonoscopy.   ENDOSCOPIST:  Charna Elizabeth, M.D.   INSTRUMENT USED:  Olympus video colonoscope.   INDICATIONS FOR PROCEDURE:  The patient is a 77 year old African-American  male with a history of prostate cancer, guaiac positive stools and  diverticulosis.  Rule out colonic polyps, masses, etc.   PREPROCEDURE PREPARATION:  Informed consent was procured from the patient.  The patient was fasted for eight hours prior to the procedure and prepped  with a bottle of magnesium citrate and a gallon of GoLYTELY the night prior  to the procedure.   PREPROCEDURE PHYSICAL:  The patient had stable vital signs.  Neck supple.  Chest clear to auscultation.  S1 and S2 regular.  Abdomen soft with normal  bowel sounds.   DESCRIPTION OF PROCEDURE:  The patient was placed in left lateral decubitus  position and sedated with 75 mg of Demerol and 7.5 mg of Versed in slow  incremental doses.  Once the patient was adequately sedated and maintained  on low flow oxygen and continuous cardiac monitoring, the Olympus video  colonoscope was advanced from the rectum to the cecum with slight  difficulty.  There was some residual stool in the colon and multiple washes  were done.  A few scattered diverticula were seen throughout the colon.  Small internal hemorrhoids were seen on retroflexion.  No masses or polyps  were identified.  The appendicular orifice and ileocecal valve were clearly  visualized and photographed.   IMPRESSION:  1. Scattered diverticulosis (few).  2. Small internal  hemorrhoids.  3. No masses or polyps seen.   RECOMMENDATIONS:  1. Repeat guaiacs on an outpatient basis.  2. Repeat colorectal cancer screening is recommended in the next five years     unless the patient develops any     abnormal symptoms in the interim.  3. Continue high fiber diet with liberal fluid intake.  4. Outpatient followup in the next two weeks for further recommendations.                                               Anselmo Rod, M.D.    JNM/MEDQ  D:  10/22/2003  T:  10/22/2003  Job:  [id number]   cc:   William A. Hensel, M.D.  [address] [place]  [place] [address]  Fax:  [phone] 

## 2011-02-27 NOTE — Discharge Summary (Signed)
[facility]  Patient:    Nicholas Olsen, Nicholas Olsen                      MRN: [id number] Adm. Date:  [id number] Disc. Date: [id number] Attending:  Pearla Dubonnet CC:         Pearla Dubonnet, M.D.  William A. Leveda Anna, M.D. of the [facility]  Armanda Magic, M.D.   Discharge Summary  REASON FOR ADMISSION:  [name] was a 77 year old white male who was admitted with the sudden onset of shortness of breath and palpitations with anterior chest pressure on the evening of January 10. He had woke up out of sleep. He was taken to the emergency room where he was found to be in SVT with a rate of about 150. He was given three sublingual nitroglycerin and converted to a sinus rhythm with a rate of 86. He was admitted to rule out ischemic cardiac disease and to monitor.  SIGNIFICANT FINDINGS:  On admission, blood pressure 117/86, heart rate 80, respirations 16, temperature 97.3. Lungs were clear. Heart exam normal. Extremities: no edema.  LABORATORIES:  Sodium 135, potassium 3.6, chloride 104, bicarbonate 23, glucose 171, BUN 15, creatinine 0.9, calcium 9.1. CPK 413 with a CPK-MB of 1.4. troponin I of 0.3.  Chest x-ray was consistent with very mild congestive heart failure.  EKG initially showed supraventricular tachycardia and nonspecific ST-T wave changes. An EKG after conversion showed normal sinus rhythm with occasional PVCs and no significant ST-T wave changes.  HOSPITAL COURSE:   The patient was admitted with SVT and converted as above. His cardiac enzymes remained negative, creatine kinases were high but MBs and troponin were normal. He was seen by Dr. Armanda Magic of cardiology. An echocardiogram was done and showed normal left ventricular systolic function and no regional wall motion abnormalities. The aortic valve thickness was mildly increased and there was trivial aortic valvular regurgitation. The patient evidenced no further  arrhythmias.  He had no further chest pain. He was initially treated with IV nitroglycerin and IV heparin and oxygen but this was discontinued. His outpatient beta blocker was increased and restarted at discharge. His blood sugar was found to be elevated and hemoglobin A1c was 9.6 and he was told to see his primary physician, Dr. Doralee Albino, within one to two weeks to be evaluated for and treated for diabetes. He also had a history of RAI ablation of his thyroid and TSH was found to be 6.72 and he was started on a low dose of Synthroid of 0.025 mg per day. He had a history of prostate cancer and a PSA was pending at the time of discharge.  DISCHARGE DIAGNOSES: 1. Supraventricular tachycardia. 2. Chest pain. 3. Diabetes mellitus. 4. Hypertension. 5. Hypothyroidism. 6. Chronic anxiety.  PROCEDURES:  Echocardiogram.  DISCHARGE MEDICATIONS: 1. Atenolol 25 mg b.i.d. 2. Synthroid 0.025 mg q.d. 3. Outpatient blood pressure medicine, patient did not remember name. 4. Ativan 0.5 mg q.d. 5. Coated aspirin one a day.  ACTIVITY:  No heavy exertion until after stress test.  DIET:  Low sodium.  FOLLOWUP:  Stress Cardiolite scheduled Monday, January 14, a.m. at [facility]. Will follow with Armanda Magic in approximately one week. Will make an appointment with Dr. Doralee Albino in the next one to two weeks regarding his diabetes. DD:  10/23/00 TD:  10/23/00 Job: [id number]

## 2011-02-27 NOTE — Consult Note (Signed)
[facility]  Patient:    Nicholas Olsen, Nicholas Olsen                        MRN: [id number] Proc. Date: 10/22/00 Attending:  Armanda Magic, M.D. CC:         Pearla Dubonnet, M.D.   Consultation Report  CHIEF COMPLAINT:  Chest pain.  HISTORY OF PRESENT ILLNESS:  This is a 77 year old male with a history of hypertension, obesity, tobacco abuse and hyperlipidemia, who presented to the emergency room with increasing shortness of breath, palpitations and chest pain across his chest with radiation to the throat associated with diaphoresis.  The patient was in bed at the time of the episode at 11:30 p.m. The pain initially lasted a few minutes and eased up with aspirin.  It then recurred and after 30 minutes to an hour, the chest pain abated.  He had no nausea or vomiting.  He did have one episode of chest pain associated with palpitations once year ago.  In the emergency room, he was found to be in SVT at 150 beats per minute.  He was given three sublingual nitroglycerins and subsequently converted to normal sinus rhythm.  PAST MEDICAL HISTORY:  Significant for hypertension, hypothyroidism with RAI ablation in 1994, obesity, chronic tobacco abuse, hyperlipidemia, palpitations, chronic anxiety and prostate cancer, status post XRT.  MEDICATIONS 1. Atenolol 50 mg a day. 2. Ativan 0.5 mg a day. 3. Some blood pressure medicine of which he is unsure.  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  He smokes up to one-half pack per day for the past 40 years.  No alcohol intake.  FAMILY HISTORY:  His father died at 94 of sepsis.  His mother is 61 years old with Alzheimers.  He has two sisters alive and well.  He has no family history of coronary disease.  REVIEW OF SYSTEMS:  As per HPI.  He did have a cold recently.  PHYSICAL EXAMINATION  VITAL SIGNS:  Blood pressure is 117/86 with a heart rate of 80 and respiratory rate 16.  HEENT:  Pupils equal, round and  reactive to light and accommodation. Extraocular muscles are intact bilaterally.  NECK:  Supple with no lymphadenopathy.  No bruits.  LUNGS:  Clear to auscultation throughout.  HEART:  Regular rate and rhythm.  No murmurs, rubs, or gallops.  Normal S1 and S2.  ABDOMEN:  Soft, nontender, nondistended, with active bowel sounds.  No hepatosplenomegaly.  EXTREMITIES:  No cyanosis, erythema or edema.  Dorsalis pedis pulses +2 bilaterally.  NEUROLOGIC:  He is alert and oriented x 3.  LABORATORY AND X-RAY FINDINGS:  PT 12.9, CPK 557, MB 2.6, relative index 0.4, troponin 0.02.  Creatinine 1, potassium 3.2.  White cell count 8.8, hemoglobin 14.5 and platelet count 225,000.  Sodium 135, potassium 3.2, chloride 102, bicarb 26, BUN ______ , glucose 208.  LFTs are within normal limits.  EKG #1 shows SVT at a rate of 150 beats per minute.  There are mild ST-T wave abnormalities laterally with ST segment depression.  EKG #2 shows normal sinus rhythm with occasional PVC, no ST-T wave abnormalities.  Chest x-ray shows cardiomegaly with a questionable CHF.  ASSESSMENT 1. Chest pain in the setting of supraventricular tachycardia, positive cardiac    risk factors including age, tobacco use, hypertension, hyperlipidemia and    elevated blood sugar.  His initial CPK is elevated but index and MB are    negative.  Troponin is negative.  Electrocardiogram during supraventricular    tachycardia showed some mild lateral ST segment depression which may be    underlying ischemia. 2. Palpitations consistent with supraventricular tachycardia in the setting of    a low potassium. 3. Hyperlipidemia. 4. History of hyperthyroidism. 5. Hypertension.  RECOMMENDATIONS 1. Admit to telemetry. 2. Rule out for myocardial infarction with serial enzymes. 3. If cardiac enzymes are negative, then would pursue stress Cardiolite study. 4. Agree with heparin, nitroglycerin and aspirin until enzymes are back.    Check  2-D echocardiogram to evaluate left ventricular size.  Check thyroid    function tests.  I would start Toprol or Cardizem for suppression of SVT. DD:  10/22/00 TD:  10/22/00 Job: [id number]

## 2011-02-27 NOTE — Procedures (Signed)
[place]  Patient:    Nicholas Olsen                       MRN: [id number] Proc. Date: 09/17/99 Adm. Date:  [id number] Attending:  Charna Elizabeth CC:         William A. Leveda Anna, M.D.             Radene Knee., M.D.             Wynn Banker, M.D.                           Procedure Report  DATE OF BIRTH:  08-31-1934.  REFERRING PHYSICIAN:  Santiago Bumpers. Leveda Anna, M.D.  PROCEDURE:  Colonoscopy with hot biopsies x 3.  ENDOSCOPIST:  Anselmo Rod, M.D.  INSTRUMENTS:  Olympus video colonoscope.  INDICATION:  A 77 year old black male with a history of prostate cancer treated  with radiation (seeds implanted) has rectal bleeding.  Rule out radiation proctitis, hemorrhoids, masses, polyps.  INFORMED CONSENT:  Informed consent was procured from the patient.  The patient was fasted for eight hours prior to the procedure and prepped with a bottle of magnesium citrate and a gallon of NuLytely the night prior to the procedure.  PHYSICAL EXAMINATION:  VITAL SIGNS:  Stable.  NECK:  Supple.  CHEST:  Clear to auscultation.  HEART:  S1 and S2 regular.  ABDOMEN:  Soft with normal abdominal bowel sounds.  DESCRIPTION OF PROCEDURE:  The patient was placed in the left lateral decubitus  position and sedated with 50 mg of Demerol and 6 mg of Versed intravenously. Once the patient was adequately sedated and maintained on low flow oxygen and continuous cardiac monitoring, the Olympus video colonoscope was advanced from the rectum o the cecum without difficulty.  Three very small sessile polyps were removed by hot biopsy forceps between 30 and 40 cm in the left colon.  No other masses or polyps were seen.  No AVMs or diverticular disease were present.  The patient had internal hemorrhoids that may have bled.  No inflammation was seen in the rectum.  The patient tolerated the procedure well.  No rectal biopsies were  done.  IMPRESSION: 1. Three sessile polyps removed by hot biopsy forceps from the left colon. 2. Small internal hemorrhoids have evidence of recent bleeding. 3. No large masses or diverticular seen.  RECOMMENDATIONS:  Await pathology results.  Avoid nonsteroidals for the next seven days.  Outpatient for further recommendations within the next two weeks. DD:  09/18/99 TD:  09/19/99 Job: [id number]

## 2011-02-27 NOTE — H&P (Signed)
[facility]  Patient:    Nicholas Olsen, Nicholas Olsen                        MRN: [id number] Adm. Date:  10/22/00 Attending:  Pearla Dubonnet, M.D. CC:         Celso Sickle, M.D.  William A. Leveda Anna, M.D.   History and Physical  CHIEF COMPLAINT:  Chest pain.  HISTORY OF PRESENT ILLNESS:  [name] is a very pleasant 77 year old male with a history of the following medical problems:  Hypertension for several years, Graves disease with radioactive iodine ablation approximately six to seven years ago, moderate obesity, chronic tobacco use, history of mild hypercholesterolemia, tachycardia episodes in the past apparently, chronic anxiety which is mild, prostate cancer with radiation therapy-treated by Dr. Elige Radon and Dr. Dan Humphreys in September 2000, occasional symptoms of reflux. He presents with sudden onset of shortness of breath and palpitations with anterior chest pressure radiating to his throat last evening at approximately 10:30 or 11:00 at night.  He had been sleeping.  He apparently developed a "head tremor" with the palpitations.  He took an aspirin and sat up and felt better but then the symptoms reoccurred.  His wife drove him to the emergency room where he was found to be in SVT at a rate of about 150.  He was given three sublingual nitroglycerin and converted to sinus rhythm at 86 with occasional PVC.  He feels well now.  He is admitted now to rule out ischemic cardiac disease and to monitor.  He did complain of some diaphoresis with his initial symptoms as well.  ALLERGIES:  PENICILLIN, SULFA, TETRACYCLINE, MYCINS all cause a rash, apparently.  MEDICATIONS: 1. Atenolol 50 mg p.o. q.a.m. 2. Ativan 0.5 mg p.o. q.a.m. 3. One other blood pressure medicine which he is not sure of the name of which    his wife will check on.  FAMILY HISTORY:  Father died at age 52 with apparent sepsis involving his leg. He was diabetic.  Mother is 57 years  old and living with Alzheimers disease. He has two sisters alive and well.  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  His wife, Vikki Ports, is present and concerned.  Their home number is [phone].  Patient has smoked tobacco for many years, but does not drink alcohol.  He moved from [place], apparently, to the [place] area 27 years ago.  REVIEW OF SYSTEMS:  Good appetite.  Felt weak yesterday morning and has a apparent history of rapid heart rate in the past but he could not really explain it well.  No obvious history of heart failure or heart attack.  Wife states at night he does sound congested frequently when he is lying down in terms of coughing with congestion.  He has no peripheral edema and does not get chest pain with ambulation.  PHYSICAL EXAMINATION:  GENERAL:  Moderately obese male in no acute distress.  VITAL SIGNS:  Blood pressure 117/86, pulse 80 and regular, respiratory rate 16, temperature 97.3.  HEENT:  Mild appearance of exophthalmos present.  NECK:  No bruits or goiter.  No JVD.  CHEST:  Clear to auscultation.  CARDIAC:  Regular rhythm without murmur, rub, or gallop.  He has no obvious gallop.  ABDOMEN:  Soft, nontender, obese, but no bruits or obvious organomegaly.  No tenderness in the right upper quadrant.  EXTREMITIES:  Without edema.  Pulses are intact  distally.  NEUROLOGIC:  Oriented x 3 and nonfocal.  LABORATORIES:  Portable chest x-ray reveals cardiomegaly with question of CHF and PA and lateral chest x-ray is being performed.  First EKG at 1:30 a.m. on October 22, 2000 revealed probable SVT versus sinus tachycardia at 1:50.  ST segment depression 1 mm in V5 and V6.  EKG #2 at 2:54 a.m. on October 22, 2000 shows him to be in normal sinus rhythm with one PVC.  No LVH.  Good R-wave progression.  PT 12.9 seconds, PTT 31 seconds.  CPK 557 with CK-MB 2.2.  Relative index 0.4. Troponin I 0.02.  Creatinine 1.0, potassium 3.2,  blood sugar 208.  LFTs normal.  CBC:  White count 8800, hemoglobin 14.5, platelet count 225,000.  ASSESSMENT:  A 76 year old male with chest pressure with supraventricular tachycardia with risk factors for coronary artery disease.  He also has a question of cardiomegaly and diabetes mellitus.  He does have a family history of diabetes.  There is also a question of whether or not he was in congestive heart failure transiently with his increased heart rate.  Currently not in heart failure.  He also has a history of hyperthyroidism treated with radioactive iodine ablation.  He has also had prostate cancer treated with radiation therapy.  Currently, his blood pressure is normal.  He does not have a history of a 2-D echo to assess myocardial contractility.  PLAN: 1. Question of coronary artery disease.  Treat with IV nitroglycerin and IV    heparin, nasal cannula O2, and will monitor him.  Check serial CPKs at 10    a.m. and 5 p.m.  Dr. Garnette Scheuermann was contacted for cardiology consult.  Will    check EKG if he has chest pain. 2. Question of cardiomegaly on portable chest x-ray.  Wonder if he has a    decreased ejection fraction.  Will check 2-D echo and also check a PA and    lateral chest x-ray. 3. SVT.  Hold beta blocker for now.  Wonder if he may be having some rebound    SVT with Atenolol dose the way it is once daily in the morning with rebound    in the evenings.  Will hold beta blocker for now.  Possible stress test    today. 4. Hypertension.  Blood pressure okay currently.  He is on IV nitroglycerin.    Wife to find out other blood pressure medication for [place]. 5. History of hyperthyroidism with radioactive iodine in place.  Check TSH. 6. History of prostate cancer.  Check PSA. 7. Elevated blood sugar.  Check hemoglobin A1C and repeat a BMET in the    morning. 8. Check urinalysis. DD:  10/22/00 TD:  10/22/00 Job: [id number]

## 2011-03-05 ENCOUNTER — Encounter (HOSPITAL_BASED_OUTPATIENT_CLINIC_OR_DEPARTMENT_OTHER): Payer: Medicare PPO | Admitting: Oncology

## 2011-03-05 ENCOUNTER — Encounter (HOSPITAL_COMMUNITY)
Admission: RE | Admit: 2011-03-05 | Discharge: 2011-03-05 | Disposition: A | Discharge status: Home / Self Care | Payer: Medicare PPO | Source: Ambulatory Visit | Attending: Oncology | Admitting: Oncology

## 2011-03-05 ENCOUNTER — Other Ambulatory Visit: Payer: Self-pay | Admitting: Oncology

## 2011-03-05 DIAGNOSIS — C8589 Other specified types of non-Hodgkin lymphoma, extranodal and solid organ sites: Secondary | ICD-10-CM | POA: Insufficient documentation

## 2011-03-05 DIAGNOSIS — Z79899 Other long term (current) drug therapy: Secondary | ICD-10-CM | POA: Insufficient documentation

## 2011-03-05 DIAGNOSIS — Z8546 Personal history of malignant neoplasm of prostate: Secondary | ICD-10-CM

## 2011-03-05 DIAGNOSIS — J984 Other disorders of lung: Secondary | ICD-10-CM | POA: Insufficient documentation

## 2011-03-05 DIAGNOSIS — C8590 Non-Hodgkin lymphoma, unspecified, unspecified site: Secondary | ICD-10-CM

## 2011-03-05 DIAGNOSIS — Z5111 Encounter for antineoplastic chemotherapy: Secondary | ICD-10-CM

## 2011-03-05 LAB — CBC WITH DIFFERENTIAL/PLATELET
BASO%: 0.2 % (ref 0.0–2.0)
Basophils Absolute: 0 10*3/uL (ref 0.0–0.1)
EOS%: 0.2 % (ref 0.0–7.0)
MCH: 33 pg (ref 27.2–33.4)
MCHC: 33.4 g/dL (ref 32.0–36.0)
MCV: 98.7 fL — ABNORMAL HIGH (ref 79.3–98.0)
MONO%: 5.8 % (ref 0.0–14.0)
RBC: 3.3 10*6/uL — ABNORMAL LOW (ref 4.20–5.82)
RDW: 22.6 % — ABNORMAL HIGH (ref 11.0–14.6)

## 2011-03-05 LAB — COMPREHENSIVE METABOLIC PANEL
ALT: 14 U/L (ref 0–53)
AST: 25 U/L (ref 0–37)
Albumin: 4.2 g/dL (ref 3.5–5.2)
Alkaline Phosphatase: 91 U/L (ref 39–117)
BUN: 14 mg/dL (ref 6–23)
Potassium: 3.6 mEq/L (ref 3.5–5.3)
Sodium: 140 mEq/L (ref 135–145)

## 2011-03-05 LAB — GLUCOSE, CAPILLARY: Glucose-Capillary: 115 mg/dL — ABNORMAL HIGH (ref 70–99)

## 2011-03-05 MED ORDER — FLUDEOXYGLUCOSE F - 18 (FDG) INJECTION
18.1000 | Freq: Once | INTRAVENOUS | Status: AC | PRN
  Administered 2011-03-05: 18.1 via INTRAVENOUS

## 2011-03-06 ENCOUNTER — Encounter (HOSPITAL_BASED_OUTPATIENT_CLINIC_OR_DEPARTMENT_OTHER): Payer: Medicare PPO | Admitting: Oncology

## 2011-03-06 DIAGNOSIS — Z5111 Encounter for antineoplastic chemotherapy: Secondary | ICD-10-CM

## 2011-03-06 DIAGNOSIS — Z5112 Encounter for antineoplastic immunotherapy: Secondary | ICD-10-CM

## 2011-03-07 ENCOUNTER — Encounter: Payer: Medicare PPO | Admitting: Oncology

## 2011-03-07 DIAGNOSIS — C8589 Other specified types of non-Hodgkin lymphoma, extranodal and solid organ sites: Secondary | ICD-10-CM

## 2011-03-10 ENCOUNTER — Encounter: Payer: Medicare PPO | Admitting: Oncology

## 2011-03-27 ENCOUNTER — Encounter (HOSPITAL_BASED_OUTPATIENT_CLINIC_OR_DEPARTMENT_OTHER): Payer: Medicare PPO | Admitting: Oncology

## 2011-03-27 ENCOUNTER — Other Ambulatory Visit: Payer: Self-pay | Admitting: Oncology

## 2011-03-27 DIAGNOSIS — Z5112 Encounter for antineoplastic immunotherapy: Secondary | ICD-10-CM

## 2011-03-27 DIAGNOSIS — Z5111 Encounter for antineoplastic chemotherapy: Secondary | ICD-10-CM

## 2011-03-27 DIAGNOSIS — C8589 Other specified types of non-Hodgkin lymphoma, extranodal and solid organ sites: Secondary | ICD-10-CM

## 2011-03-27 DIAGNOSIS — Z8546 Personal history of malignant neoplasm of prostate: Secondary | ICD-10-CM

## 2011-03-27 LAB — CBC WITH DIFFERENTIAL/PLATELET
Basophils Absolute: 0.1 10*3/uL (ref 0.0–0.1)
EOS%: 0.7 % (ref 0.0–7.0)
HGB: 11.4 g/dL — ABNORMAL LOW (ref 13.0–17.1)
MCH: 31.7 pg (ref 27.2–33.4)
MCHC: 33.5 g/dL (ref 32.0–36.0)
MCV: 94.4 fL (ref 79.3–98.0)
MONO%: 11.3 % (ref 0.0–14.0)
NEUT%: 76.7 % — ABNORMAL HIGH (ref 39.0–75.0)
RDW: 18.3 % — ABNORMAL HIGH (ref 11.0–14.6)

## 2011-03-27 LAB — COMPREHENSIVE METABOLIC PANEL
Alkaline Phosphatase: 88 U/L (ref 39–117)
BUN: 13 mg/dL (ref 6–23)
Glucose, Bld: 91 mg/dL (ref 70–99)
Total Bilirubin: 0.2 mg/dL — ABNORMAL LOW (ref 0.3–1.2)

## 2011-03-28 ENCOUNTER — Encounter (HOSPITAL_BASED_OUTPATIENT_CLINIC_OR_DEPARTMENT_OTHER): Payer: Medicare PPO | Admitting: Oncology

## 2011-03-28 DIAGNOSIS — C8589 Other specified types of non-Hodgkin lymphoma, extranodal and solid organ sites: Secondary | ICD-10-CM

## 2011-03-28 DIAGNOSIS — Z5111 Encounter for antineoplastic chemotherapy: Secondary | ICD-10-CM

## 2011-03-28 DIAGNOSIS — Z8546 Personal history of malignant neoplasm of prostate: Secondary | ICD-10-CM

## 2011-04-07 ENCOUNTER — Encounter: Payer: Self-pay | Admitting: Cardiovascular Disease

## 2011-04-22 ENCOUNTER — Other Ambulatory Visit: Payer: Self-pay | Admitting: Neurosurgery

## 2011-04-22 DIAGNOSIS — E237 Disorder of pituitary gland, unspecified: Secondary | ICD-10-CM

## 2011-04-23 ENCOUNTER — Telehealth: Payer: Self-pay | Admitting: *Deleted

## 2011-04-23 NOTE — Telephone Encounter (Signed)
Patty [phone]) from Dr.Cram's (?) office left message: They were unable to do blood work on patient. Dr.Cram ordered a pituaritary panel (?sp) and they would like to know, if we could do the blood work.  I will fwd. To Dr.Hensel for review and to enter the test, so patient can be scheduled in our lab. Lorenda Hatchet, Renato Battles

## 2011-04-24 NOTE — Telephone Encounter (Signed)
This is an odd request and I cannot agree without further information.  First, I need to know what is specifically in a pituitary panel (what individual tests are they referring to?)  Second, each lab test will need to be associated with a specific diagnosis.  They would need to provide the diagnosis for each test they wish to order.  Because of these requirements, it is customary for physicians to order their own blood work on patients.  We can do it if they provide both the specific test and diagnosis code for each test.

## 2011-04-24 NOTE — Telephone Encounter (Signed)
Dr. Leveda Anna, I spoke with patty and she is going to fax over the order with the dx codes. She sates that this order was just put in 2 days ago. Mr. Mulvey is a difficult stick and being that he informed them that we are good and usually get it they figured he should come here. I read er below message and informed her that we need to know what specific tests and NEED dx codes to them. She agreed and will faxed attn: to me ---[place] Dec

## 2011-04-30 ENCOUNTER — Ambulatory Visit
Admission: RE | Admit: 2011-04-30 | Discharge: 2011-04-30 | Disposition: A | Discharge status: Home / Self Care | Payer: Medicare PPO | Source: Ambulatory Visit | Attending: Neurosurgery | Admitting: Neurosurgery

## 2011-04-30 DIAGNOSIS — E237 Disorder of pituitary gland, unspecified: Secondary | ICD-10-CM

## 2011-04-30 MED ORDER — GADOBENATE DIMEGLUMINE 529 MG/ML IV SOLN
10.0000 mL | Freq: Once | INTRAVENOUS | Status: AC | PRN
  Administered 2011-04-30: 10 mL via INTRAVENOUS

## 2011-05-05 ENCOUNTER — Encounter (HOSPITAL_BASED_OUTPATIENT_CLINIC_OR_DEPARTMENT_OTHER): Payer: Medicare PPO | Admitting: Oncology

## 2011-05-05 ENCOUNTER — Other Ambulatory Visit: Payer: Self-pay | Admitting: Oncology

## 2011-05-05 DIAGNOSIS — Z5112 Encounter for antineoplastic immunotherapy: Secondary | ICD-10-CM

## 2011-05-05 DIAGNOSIS — C8590 Non-Hodgkin lymphoma, unspecified, unspecified site: Secondary | ICD-10-CM

## 2011-05-05 DIAGNOSIS — D649 Anemia, unspecified: Secondary | ICD-10-CM

## 2011-05-05 DIAGNOSIS — Z8546 Personal history of malignant neoplasm of prostate: Secondary | ICD-10-CM

## 2011-05-05 DIAGNOSIS — Z5111 Encounter for antineoplastic chemotherapy: Secondary | ICD-10-CM

## 2011-05-05 DIAGNOSIS — R911 Solitary pulmonary nodule: Secondary | ICD-10-CM

## 2011-05-05 DIAGNOSIS — C8589 Other specified types of non-Hodgkin lymphoma, extranodal and solid organ sites: Secondary | ICD-10-CM

## 2011-05-05 LAB — CBC WITH DIFFERENTIAL/PLATELET
Basophils Absolute: 0 10*3/uL (ref 0.0–0.1)
EOS%: 20.7 % — ABNORMAL HIGH (ref 0.0–7.0)
HCT: 31.8 % — ABNORMAL LOW (ref 38.4–49.9)
HGB: 10.7 g/dL — ABNORMAL LOW (ref 13.0–17.1)
MCH: 33.1 pg (ref 27.2–33.4)
MCV: 98 fL (ref 79.3–98.0)
MONO%: 9.8 % (ref 0.0–14.0)
NEUT%: 43.3 % (ref 39.0–75.0)

## 2011-05-06 LAB — CORTISOL: Cortisol, Plasma: 10.4 ug/dL

## 2011-05-06 LAB — TSH: TSH: 2.579 u[IU]/mL (ref 0.350–4.500)

## 2011-05-06 LAB — LUTEINIZING HORMONE: LH: 12.2 m[IU]/mL (ref 3.1–34.6)

## 2011-05-06 LAB — FOLLICLE STIMULATING HORMONE: FSH: 23.5 m[IU]/mL — ABNORMAL HIGH (ref 1.4–18.1)

## 2011-05-06 LAB — ACTH: C206 ACTH: 42 pg/mL (ref 10–46)

## 2011-05-22 ENCOUNTER — Ambulatory Visit: Payer: Medicare PPO | Admitting: Family Medicine

## 2011-05-26 ENCOUNTER — Ambulatory Visit: Payer: Medicare PPO | Admitting: Family Medicine

## 2011-05-29 ENCOUNTER — Other Ambulatory Visit: Payer: Self-pay | Admitting: Family Medicine

## 2011-05-29 NOTE — Telephone Encounter (Signed)
Refill request

## 2011-06-05 ENCOUNTER — Encounter: Payer: Self-pay | Admitting: Family Medicine

## 2011-06-05 ENCOUNTER — Ambulatory Visit (INDEPENDENT_AMBULATORY_CARE_PROVIDER_SITE_OTHER): Payer: Medicare PPO | Admitting: Family Medicine

## 2011-06-05 VITALS — BP 169/109 | HR 70 | Wt 188.0 lb

## 2011-06-05 DIAGNOSIS — C8589 Other specified types of non-Hodgkin lymphoma, extranodal and solid organ sites: Secondary | ICD-10-CM

## 2011-06-05 DIAGNOSIS — C8330 Diffuse large B-cell lymphoma, unspecified site: Secondary | ICD-10-CM

## 2011-06-05 DIAGNOSIS — E1159 Type 2 diabetes mellitus with other circulatory complications: Secondary | ICD-10-CM

## 2011-06-05 DIAGNOSIS — I10 Essential (primary) hypertension: Secondary | ICD-10-CM

## 2011-06-05 LAB — CBC
HCT: 38.2 % — ABNORMAL LOW (ref 39.0–52.0)
Hemoglobin: 12.7 g/dL — ABNORMAL LOW (ref 13.0–17.0)
MCH: 31.4 pg (ref 26.0–34.0)
MCHC: 33.2 g/dL (ref 30.0–36.0)
MCV: 94.6 fL (ref 78.0–100.0)

## 2011-06-05 MED ORDER — ZOSTER VACCINE LIVE 19400 UNT/0.65ML ~~LOC~~ SOLR: 0.6500 mL | Freq: Once | SUBCUTANEOUS | Status: DC

## 2011-06-05 NOTE — Assessment & Plan Note (Signed)
Check CBC, make sure OK and stop iron.

## 2011-06-05 NOTE — Patient Instructions (Addendum)
Blood pressure was up today.  Check it at home 2 times per week, write the numbers down, then see me in 6 weeks with numbers so we can decide if any changes in medications. Below is the list of medicines I think you're taking.  Double check at home and call me if different.   Go back to taking one baby aspirin every day. I sent a prescription for the shingles vaccine to your pharmacy.  See how much it costs with your insurance.

## 2011-06-05 NOTE — Progress Notes (Signed)
  Subjective:    Patient ID: Nicholas Olsen, male    DOB: [birth year], 77 y.o.   MRN: [id number]  HPI Doing well.  Completed chemo and seems to be cancer free.  Followed by onc. Blood pressure up, but missed last night and this mornings meds.  Has been running mostly normal by home blood pressures. Mood is great.  Feels blessed.      Review of Systems     Objective:   Physical Exam Lungs, clear Cardiac RRR without m or g Abd benign       Assessment & Plan:

## 2011-06-05 NOTE — Assessment & Plan Note (Addendum)
Poorly controled but off meds.  Take home BP record on meds.  Recheck in 6 weeks with record and will adjust as needed.

## 2011-06-11 ENCOUNTER — Ambulatory Visit (HOSPITAL_COMMUNITY)
Admission: RE | Admit: 2011-06-11 | Discharge: 2011-06-11 | Disposition: A | Discharge status: Home / Self Care | Payer: Medicare PPO | Source: Ambulatory Visit | Attending: Oncology | Admitting: Oncology

## 2011-06-11 DIAGNOSIS — C8590 Non-Hodgkin lymphoma, unspecified, unspecified site: Secondary | ICD-10-CM

## 2011-06-11 DIAGNOSIS — I2510 Atherosclerotic heart disease of native coronary artery without angina pectoris: Secondary | ICD-10-CM | POA: Insufficient documentation

## 2011-06-11 DIAGNOSIS — I7789 Other specified disorders of arteries and arterioles: Secondary | ICD-10-CM | POA: Insufficient documentation

## 2011-06-11 DIAGNOSIS — R918 Other nonspecific abnormal finding of lung field: Secondary | ICD-10-CM | POA: Insufficient documentation

## 2011-06-11 DIAGNOSIS — C61 Malignant neoplasm of prostate: Secondary | ICD-10-CM | POA: Insufficient documentation

## 2011-06-11 DIAGNOSIS — C8589 Other specified types of non-Hodgkin lymphoma, extranodal and solid organ sites: Secondary | ICD-10-CM | POA: Insufficient documentation

## 2011-06-11 DIAGNOSIS — Z9221 Personal history of antineoplastic chemotherapy: Secondary | ICD-10-CM | POA: Insufficient documentation

## 2011-06-11 DIAGNOSIS — R911 Solitary pulmonary nodule: Secondary | ICD-10-CM

## 2011-06-11 DIAGNOSIS — Z923 Personal history of irradiation: Secondary | ICD-10-CM | POA: Insufficient documentation

## 2011-06-11 DIAGNOSIS — K7689 Other specified diseases of liver: Secondary | ICD-10-CM | POA: Insufficient documentation

## 2011-06-11 DIAGNOSIS — Z87891 Personal history of nicotine dependence: Secondary | ICD-10-CM | POA: Insufficient documentation

## 2011-06-11 DIAGNOSIS — C169 Malignant neoplasm of stomach, unspecified: Secondary | ICD-10-CM | POA: Insufficient documentation

## 2011-06-13 ENCOUNTER — Other Ambulatory Visit: Payer: Self-pay | Admitting: Family Medicine

## 2011-06-14 NOTE — Telephone Encounter (Signed)
Refill request

## 2011-06-16 ENCOUNTER — Other Ambulatory Visit: Payer: Self-pay | Admitting: Oncology

## 2011-06-16 ENCOUNTER — Encounter (HOSPITAL_BASED_OUTPATIENT_CLINIC_OR_DEPARTMENT_OTHER): Payer: Medicare PPO | Admitting: Oncology

## 2011-06-16 DIAGNOSIS — C8590 Non-Hodgkin lymphoma, unspecified, unspecified site: Secondary | ICD-10-CM

## 2011-06-16 DIAGNOSIS — Z5111 Encounter for antineoplastic chemotherapy: Secondary | ICD-10-CM

## 2011-06-16 DIAGNOSIS — C8589 Other specified types of non-Hodgkin lymphoma, extranodal and solid organ sites: Secondary | ICD-10-CM

## 2011-06-16 DIAGNOSIS — Z5112 Encounter for antineoplastic immunotherapy: Secondary | ICD-10-CM

## 2011-06-16 DIAGNOSIS — Z8546 Personal history of malignant neoplasm of prostate: Secondary | ICD-10-CM

## 2011-06-16 LAB — CBC WITH DIFFERENTIAL/PLATELET
BASO%: 0.5 % (ref 0.0–2.0)
Eosinophils Absolute: 0.2 10*3/uL (ref 0.0–0.5)
HCT: 37.3 % — ABNORMAL LOW (ref 38.4–49.9)
LYMPH%: 32.9 % (ref 14.0–49.0)
MCHC: 33.6 g/dL (ref 32.0–36.0)
MCV: 96.5 fL (ref 79.3–98.0)
MONO#: 0.4 10*3/uL (ref 0.1–0.9)
NEUT%: 53.7 % (ref 39.0–75.0)
Platelets: 149 10*3/uL (ref 140–400)
WBC: 4.4 10*3/uL (ref 4.0–10.3)

## 2011-06-17 ENCOUNTER — Ambulatory Visit (INDEPENDENT_AMBULATORY_CARE_PROVIDER_SITE_OTHER): Payer: Medicare PPO | Admitting: Family Medicine

## 2011-06-17 ENCOUNTER — Encounter: Payer: Self-pay | Admitting: Family Medicine

## 2011-06-17 VITALS — BP 149/94 | HR 67 | Temp 98.0°F | Ht 73.0 in | Wt 189.4 lb

## 2011-06-17 DIAGNOSIS — I10 Essential (primary) hypertension: Secondary | ICD-10-CM

## 2011-06-17 MED ORDER — HYDROCHLOROTHIAZIDE 12.5 MG PO TABS: 12.5000 mg | ORAL_TABLET | Freq: Every day | ORAL | Status: DC

## 2011-06-19 NOTE — Assessment & Plan Note (Signed)
Cleared lasix from med list.  Restart HCTZ at 12.5 mg dose.  Recheck one month to make sure swelling resolved and BP controled.

## 2011-06-19 NOTE — Progress Notes (Signed)
  Subjective:    Patient ID: Nicholas Olsen, male    DOB: 12/17/1933, 77 y.o.   MRN: [id number]  HPI Patient having mild ankle swelling.  On med rec, I discovered he is off both his lasix and HCTZ.  I had meant to hold one (not both) at previous visit.  Leg swelling is both ankles.    Review of Systems No chest pain, SOB    Objective:   Physical Exam Lungs clear Cardiac RRR Ext 1+ bilateral edema       Assessment & Plan:

## 2011-06-23 ENCOUNTER — Ambulatory Visit (HOSPITAL_COMMUNITY): Payer: Medicare PPO

## 2011-06-24 ENCOUNTER — Ambulatory Visit: Payer: Medicare PPO | Admitting: Home Health Services

## 2011-06-30 ENCOUNTER — Encounter: Payer: Self-pay | Admitting: Cardiovascular Disease

## 2011-07-02 ENCOUNTER — Ambulatory Visit: Payer: Medicare PPO | Admitting: Cardiovascular Disease

## 2011-07-02 ENCOUNTER — Ambulatory Visit (HOSPITAL_COMMUNITY)
Admission: RE | Admit: 2011-07-02 | Discharge: 2011-07-02 | Disposition: A | Discharge status: Home / Self Care | Payer: Medicare PPO | Source: Ambulatory Visit | Attending: Oncology | Admitting: Oncology

## 2011-07-02 DIAGNOSIS — C8589 Other specified types of non-Hodgkin lymphoma, extranodal and solid organ sites: Secondary | ICD-10-CM | POA: Insufficient documentation

## 2011-07-02 DIAGNOSIS — Z79899 Other long term (current) drug therapy: Secondary | ICD-10-CM | POA: Insufficient documentation

## 2011-07-02 DIAGNOSIS — C8590 Non-Hodgkin lymphoma, unspecified, unspecified site: Secondary | ICD-10-CM

## 2011-07-02 DIAGNOSIS — Z452 Encounter for adjustment and management of vascular access device: Secondary | ICD-10-CM | POA: Insufficient documentation

## 2011-07-17 ENCOUNTER — Telehealth: Payer: Self-pay | Admitting: Family Medicine

## 2011-07-17 NOTE — Telephone Encounter (Signed)
Patient is at Dr. Alwyn Pea office.  They have been trying to get labs for the visit today, but have not been able to and now they need to speak with a nurse for this visit.

## 2011-07-17 NOTE — Telephone Encounter (Signed)
Faxed labs to [phone]

## 2011-07-17 NOTE — Telephone Encounter (Signed)
Pt is requesting to speak with Dr Leveda Anna - would not leave any message and he was told that Leveda Anna is out of town until next week,  He said he would wait.

## 2011-07-20 NOTE — Telephone Encounter (Signed)
Pt is returning call to Dr Leveda Anna  His provider at [facility], Dr Collier Bullock, she wants Dr Rexene Edison to fax the medicines he is on Fax to [phone]

## 2011-07-20 NOTE — Telephone Encounter (Signed)
Spoke to [name].  As noted, he wants a list of his meds faxed to Dr. Collier Bullock at [facility].  He also informed me that a recent visit to Dr. Loreta Ave (GI) had switched him from omeprozole to another PPI but was not sure of name (dose was 60 mg.)  He will call back with the name of the med so that I fax the correct list to [place].

## 2011-07-21 ENCOUNTER — Telehealth: Payer: Self-pay | Admitting: Family Medicine

## 2011-07-21 MED ORDER — DEXLANSOPRAZOLE 60 MG PO CPDR: 60.0000 mg | DELAYED_RELEASE_CAPSULE | Freq: Every day | ORAL | Status: AC

## 2011-07-21 NOTE — Telephone Encounter (Signed)
Updated med list and provide letter for [facility].

## 2011-07-21 NOTE — Telephone Encounter (Signed)
Mr. Wyss is calling with the name of a medication for Dr. Leveda Anna.  It is called Dexilant and is replacing medication for Omeprazole.  It was prescribed by Dr. Loreta Ave.

## 2011-08-06 ENCOUNTER — Telehealth: Payer: Self-pay | Admitting: Family Medicine

## 2011-08-06 NOTE — Telephone Encounter (Signed)
Fwd to pcp

## 2011-08-06 NOTE — Telephone Encounter (Signed)
Nicholas Olsen is calling because he is going to go to the Dentist and he wants to speak to Dr. Leveda Anna about being off of Chemo for 2 months.  He isn't really sure how long he has been off of Chemo and the Dentist told him to call his PCP to get the ok to be able to come in and get his teeth cleaned.

## 2011-08-06 NOTE — Telephone Encounter (Signed)
Given info he needed.  He also has an appointment for tomorrow.

## 2011-08-07 ENCOUNTER — Encounter: Payer: Self-pay | Admitting: Family Medicine

## 2011-08-07 ENCOUNTER — Ambulatory Visit (INDEPENDENT_AMBULATORY_CARE_PROVIDER_SITE_OTHER): Payer: Medicare PPO | Admitting: Family Medicine

## 2011-08-07 VITALS — BP 138/78 | HR 80 | Temp 97.3°F | Ht 73.0 in | Wt 197.3 lb

## 2011-08-07 DIAGNOSIS — C8330 Diffuse large B-cell lymphoma, unspecified site: Secondary | ICD-10-CM

## 2011-08-07 DIAGNOSIS — E7800 Pure hypercholesterolemia, unspecified: Secondary | ICD-10-CM

## 2011-08-07 DIAGNOSIS — Z23 Encounter for immunization: Secondary | ICD-10-CM

## 2011-08-07 DIAGNOSIS — C8589 Other specified types of non-Hodgkin lymphoma, extranodal and solid organ sites: Secondary | ICD-10-CM

## 2011-08-07 LAB — COMPLETE METABOLIC PANEL WITH GFR
CO2: 23 mEq/L (ref 19–32)
Creat: 0.82 mg/dL (ref 0.50–1.35)
GFR, Est African American: >90 mL/min (ref >90)
GFR, Est Non African American: 87 mL/min — ABNORMAL LOW (ref >90)
Glucose, Bld: 110 mg/dL — ABNORMAL HIGH (ref 70–99)
Total Bilirubin: 0.4 mg/dL (ref 0.3–1.2)

## 2011-08-07 LAB — CBC
HCT: 37.8 % — ABNORMAL LOW (ref 39.0–52.0)
MCH: 30.6 pg (ref 26.0–34.0)
MCHC: 33.1 g/dL (ref 30.0–36.0)
MCV: 92.4 fL (ref 78.0–100.0)
Platelets: 189 10*3/uL (ref 150–400)
RDW: 15.3 % (ref 11.5–15.5)
WBC: 4.8 10*3/uL (ref 4.0–10.5)

## 2011-08-07 LAB — LIPID PANEL
HDL: 66 mg/dL (ref >39)
LDL Cholesterol: 170 mg/dL — ABNORMAL HIGH (ref 0–99)

## 2011-08-07 NOTE — Assessment & Plan Note (Signed)
Now with some Rt upper quadrent mild discomfort.  Sees Onc ([facility]) on 10/29.  Likely will need CT abd.

## 2011-08-07 NOTE — Patient Instructions (Signed)
I will call with blood work results You got a flu shot today I assume Dr. Truett Perna will want to do CT scan of your abdomen.  Let me know if he does not. Make sure Dr. Truett Perna is aware that I did blood work today.

## 2011-08-10 ENCOUNTER — Other Ambulatory Visit: Payer: Self-pay | Admitting: *Deleted

## 2011-08-10 ENCOUNTER — Encounter (HOSPITAL_BASED_OUTPATIENT_CLINIC_OR_DEPARTMENT_OTHER): Payer: Medicare PPO | Admitting: Oncology

## 2011-08-10 ENCOUNTER — Encounter: Payer: Self-pay | Admitting: Family Medicine

## 2011-08-10 DIAGNOSIS — C8589 Other specified types of non-Hodgkin lymphoma, extranodal and solid organ sites: Secondary | ICD-10-CM

## 2011-08-10 DIAGNOSIS — R109 Unspecified abdominal pain: Secondary | ICD-10-CM

## 2011-08-10 DIAGNOSIS — Z8546 Personal history of malignant neoplasm of prostate: Secondary | ICD-10-CM

## 2011-08-10 NOTE — Progress Notes (Signed)
  Subjective:    Patient ID: Nicholas Olsen, male    DOB: [birth year], 77 y.o.   MRN: [id number]  HPI  Small amount of stomach discomfort.  Due for recheck of gastric lymphoma by Dr. Truett Perna in 3 days.  No change in appetite, bowel or bladder function.  No fever.  Trying to lose wt and be more active.  No going [facility].    Review of Systems     Objective:   Physical Exam Abd benign        Assessment & Plan:

## 2011-08-10 NOTE — Assessment & Plan Note (Signed)
Due for recheck.  He has been intollerant of multi statins in the past.

## 2011-08-14 ENCOUNTER — Telehealth: Payer: Self-pay | Admitting: Family Medicine

## 2011-08-14 NOTE — Telephone Encounter (Signed)
Mr. Riedesel is calling because he wasn't sure if Dr. Leveda Anna had sent a Rx for Dexilant.  I informed him that it was sent to [facility] in [place] on 10/9.  He is going to call [facility] and be sure that the medication is still there.

## 2011-08-19 ENCOUNTER — Encounter: Payer: Self-pay | Admitting: Cardiovascular Disease

## 2011-08-20 ENCOUNTER — Encounter: Payer: Self-pay | Admitting: Family Medicine

## 2011-08-20 NOTE — Progress Notes (Signed)
  Subjective:    Patient ID: Nicholas Olsen, male    DOB: [birth year], 77 y.o.   MRN: [id number]  HPI EGD on 08/19/11 showed no recurrent lymphoma.  + GERD    Review of Systems     Objective:   Physical Exam        Assessment & Plan:

## 2011-08-21 ENCOUNTER — Telehealth: Payer: Self-pay | Admitting: Family Medicine

## 2011-08-21 NOTE — Telephone Encounter (Signed)
Pt asking to speak with Dr. Leveda Anna, did not give details

## 2011-08-21 NOTE — Telephone Encounter (Signed)
Nicholas Olsen would like for you to send over a copy of his complete medication record to Dr. Collier Bullock at the [facility] in [place]. He told me that he is now taking primidol 50 mg once (1 tab at night) for 1 week and will begin taking it bid next week (1 tab in the morning and 2 tabs at night)  their Fax to [phone]. message forwarded to Dr. Leveda Anna .Marland KitchenLoralee Pacas [place]

## 2011-08-31 ENCOUNTER — Telehealth: Payer: Self-pay | Admitting: Family Medicine

## 2011-08-31 NOTE — Telephone Encounter (Signed)
Nicholas Olsen is calling to leave a message with Dr. Leveda Anna because he is having a problem with his Equilibrium.  He was offered an appointment but said he would like to speak with him before he comes in.

## 2011-08-31 NOTE — Telephone Encounter (Signed)
Spoke with pt he's going to the [facility] every other day. Not being solid on his left side, not staggering been happening for the past when taking chemo, and hadn't had any problems but started a med with neurologist primidone 50 mg informed pt that this medication will cause loss of balance or coordination; told him to d/c the medication and contact his neurologist immediately. Pt promised me that he would call his neurologist once we got off of the phone. Told him that I would Forward this to Dr. Leveda Anna.Loralee Pacas [place]

## 2011-09-14 ENCOUNTER — Other Ambulatory Visit: Payer: Self-pay | Admitting: *Deleted

## 2011-09-14 DIAGNOSIS — C8589 Other specified types of non-Hodgkin lymphoma, extranodal and solid organ sites: Secondary | ICD-10-CM

## 2011-09-15 ENCOUNTER — Other Ambulatory Visit: Payer: Medicare PPO | Admitting: Lab

## 2011-09-15 ENCOUNTER — Other Ambulatory Visit: Payer: Self-pay | Admitting: *Deleted

## 2011-09-15 ENCOUNTER — Ambulatory Visit: Payer: Medicare PPO | Admitting: Nurse Practitioner

## 2011-09-15 ENCOUNTER — Telehealth: Payer: Self-pay | Admitting: *Deleted

## 2011-09-16 ENCOUNTER — Ambulatory Visit (INDEPENDENT_AMBULATORY_CARE_PROVIDER_SITE_OTHER): Payer: Medicare PPO | Admitting: Family Medicine

## 2011-09-16 ENCOUNTER — Encounter: Payer: Self-pay | Admitting: Family Medicine

## 2011-09-16 ENCOUNTER — Telehealth: Payer: Self-pay | Admitting: Oncology

## 2011-09-16 VITALS — BP 150/94 | HR 75 | Ht 73.0 in | Wt 201.0 lb

## 2011-09-16 DIAGNOSIS — Z79899 Other long term (current) drug therapy: Secondary | ICD-10-CM

## 2011-09-16 DIAGNOSIS — E1159 Type 2 diabetes mellitus with other circulatory complications: Secondary | ICD-10-CM

## 2011-09-16 DIAGNOSIS — I10 Essential (primary) hypertension: Secondary | ICD-10-CM

## 2011-09-16 DIAGNOSIS — I798 Other disorders of arteries, arterioles and capillaries in diseases classified elsewhere: Secondary | ICD-10-CM

## 2011-09-16 DIAGNOSIS — G250 Essential tremor: Secondary | ICD-10-CM

## 2011-09-16 DIAGNOSIS — G252 Other specified forms of tremor: Secondary | ICD-10-CM

## 2011-09-16 LAB — POCT GLYCOSYLATED HEMOGLOBIN (HGB A1C): Hemoglobin A1C: 7.4

## 2011-09-16 MED ORDER — PROPRANOLOL HCL 20 MG PO TABS: 20.0000 mg | ORAL_TABLET | Freq: Three times a day (TID) | ORAL | Status: DC

## 2011-09-16 NOTE — Telephone Encounter (Signed)
S/w the pt and he is aware of his dec 28th appts

## 2011-09-16 NOTE — Patient Instructions (Addendum)
A drug that you currently take, propranolol, helps both the tremor and the high blood pressure. I increased the dose of the propranolol to 20 mg three times a day and sent a prescription.  You can use up your old medicine by just taking 2 pills, three times a day.   Your blood pressure was up a little today. Your diabetes is not in quite as good as before.  Buckle down and get it better like before. See me in one month. Let me know when you get that shingles vaccine. Merry Christmas to you and Vikki Ports

## 2011-09-17 NOTE — Assessment & Plan Note (Signed)
Increase propranolol for dual effect.

## 2011-09-17 NOTE — Assessment & Plan Note (Signed)
Will increase propranolol to see if controls.  Hold off sinemet for now.

## 2011-09-17 NOTE — Progress Notes (Signed)
  Subjective:    Patient ID: Nicholas Olsen, male    DOB: [birth year], 77 y.o.   MRN: [id number]  HPI  Seen by neuro for tremor.  Apparently thought some Parkinsonism.  Given Sinemet and he is reluctant to start due to concern of side effects.     Review of Systems     Objective:   Physical Exam  BP up Minimal fine tremor. No cogwheel.      Assessment & Plan:

## 2011-10-09 ENCOUNTER — Other Ambulatory Visit (HOSPITAL_BASED_OUTPATIENT_CLINIC_OR_DEPARTMENT_OTHER): Payer: Medicare PPO | Admitting: Lab

## 2011-10-09 ENCOUNTER — Ambulatory Visit (HOSPITAL_BASED_OUTPATIENT_CLINIC_OR_DEPARTMENT_OTHER): Payer: Medicare PPO | Admitting: Nurse Practitioner

## 2011-10-09 ENCOUNTER — Other Ambulatory Visit: Payer: Self-pay | Admitting: Oncology

## 2011-10-09 VITALS — BP 138/87 | HR 97 | Temp 97.0°F | Ht 73.0 in | Wt 207.4 lb

## 2011-10-09 DIAGNOSIS — Z5111 Encounter for antineoplastic chemotherapy: Secondary | ICD-10-CM

## 2011-10-09 DIAGNOSIS — Z5112 Encounter for antineoplastic immunotherapy: Secondary | ICD-10-CM

## 2011-10-09 DIAGNOSIS — C8589 Other specified types of non-Hodgkin lymphoma, extranodal and solid organ sites: Secondary | ICD-10-CM

## 2011-10-09 DIAGNOSIS — Z8546 Personal history of malignant neoplasm of prostate: Secondary | ICD-10-CM

## 2011-10-09 DIAGNOSIS — C8330 Diffuse large B-cell lymphoma, unspecified site: Secondary | ICD-10-CM

## 2011-10-09 DIAGNOSIS — R911 Solitary pulmonary nodule: Secondary | ICD-10-CM

## 2011-10-09 LAB — CBC WITH DIFFERENTIAL/PLATELET
Basophils Absolute: 0 10*3/uL (ref 0.0–0.1)
EOS%: 4 % (ref 0.0–7.0)
HCT: 39.3 % (ref 38.4–49.9)
HGB: 13.4 g/dL (ref 13.0–17.1)
LYMPH%: 33.6 % (ref 14.0–49.0)
MCH: 32.2 pg (ref 27.2–33.4)
MCHC: 34.2 g/dL (ref 32.0–36.0)
MONO#: 0.3 10*3/uL (ref 0.1–0.9)
NEUT%: 55.9 % (ref 39.0–75.0)
Platelets: 165 10*3/uL (ref 140–400)
lymph#: 1.6 10*3/uL (ref 0.9–3.3)

## 2011-10-09 NOTE — Progress Notes (Signed)
OFFICE PROGRESS NOTE  Interval history:  Nicholas Olsen returns as scheduled. At the time of his last visit on 08/10/2011 he reported intermittent pain at the left upper quadrant. He underwent an upper endoscopy procedure by Dr. Loreta Ave on 08/19/2011. Findings included a small hiatal hernia and otherwise normal EGD. He is seen today for scheduled followup.  Nicholas Olsen reports the abdominal pain has resolved. No fevers or sweats. He has a good appetite. No shortness of breath or cough. No bowel or bladder problems. He overall feels well.  Objective: Blood pressure 138/87, pulse 97, temperature 97 F (36.1 C), temperature source Oral, height 6\' 1"  (1.854 m), weight 207 lb 6.4 oz (94.076 kg).  Oropharynx is without thrush or ulceration. No palpable cervical, supraclavicular, axillary or inguinal lymph nodes. Lungs are clear. No wheezes or rales. Regular cardiac rhythm. Abdomen is soft and nontender. No organomegaly. Trace bilateral pretibial edema.   Lab Results: Lab Results  Component Value Date   WBC 4.9 10/09/2011   HGB 13.4 10/09/2011   HCT 39.3 10/09/2011   MCV 94.2 10/09/2011   PLT 165 10/09/2011    Chemistry:    Chemistry      Component Value Date/Time   NA 138 08/07/2011 1158   K 3.7 08/07/2011 1158   CL 100 08/07/2011 1158   CO2 23 08/07/2011 1158   BUN 16 08/07/2011 1158   CREATININE 0.82 08/07/2011 1158   CREATININE 0.60 03/27/2011 0910      Component Value Date/Time   CALCIUM 9.5 08/07/2011 1158   ALKPHOS 76 08/07/2011 1158   AST 27 08/07/2011 1158   ALT 17 08/07/2011 1158   BILITOT 0.4 08/07/2011 1158       Studies/Results: No results found.  Medications: I have reviewed the patient's current medications.  Assessment/Plan:  1. 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  Staging PET scan was consistent with hypermetabolic lymphoma involving the stomach and perigastric lymph nodes.  Bone marrow biopsy on 11/24/2010  was negative for evidence of involvement with lymphoma.  He began treatment with CHOP/Rituxan on 12/04/2010.  Restaging PET scan 03/03/2011 revealed resolution of hypermetabolic activity at perigastric lymph nodes and decrease in the hypermetabolic activity at the stomach.  He completed the 6th and final cycle of systemic therapy on 03/27/2011.  An upper endoscopy on 01/01/2011 by Dr. Loreta Ave revealed no remaining tumor. 2. Hospitalization 03/12 through 12/25/2010 for a gastrointestinal bleed, presumably secondary to lymphoma, resolved. 3. Anemia secondary to gastrointestinal bleeding and chemotherapy, improved on labs 06/16/2011. 4. Hypermetabolic lung nodule, question primary lung cancer versus involvement of the lung with non-Hodgkin's lymphoma.  The nodule was smaller and less hypermetabolic on the PET scan 03/05/2011 suggesting a benign process or lymphoma.  The nodule was again smaller on the CT 06/11/2011. Restaging chest CT is planned at a six-month interval. 5. History of abdominal pain, likely secondary to the gastric mass and surrounding lymphadenopathy. 6. Increased FDG activity of the pituitary gland on a PET scan 11/20/2010 and an MRI on 12/03/2010 with 2 hypermetabolic subcentimeter hypoenhancing lesions within the sella turcica that appeared to be distinct.  The radiologist felt the differential diagnosis included a pituitary microadenoma, lymphomatous involvement of the pituitary, or combination of the two.  The hypermetabolic activity of the pituitary persisted on the PET scan 03/05/2011.  He was evaluated by Dr. Wynetta Emery and a repeat MRI of the brain revealed no significant change.  Dr. Wynetta Emery feels the lesions are likely benign and recommends an observation  approach. 7. Diabetes. 8. Hypertension. 9. History of prostate cancer. 10. History of arthralgias secondary to Neulasta, relieved with Tylenol. 11. History of left leg edema, status post negative Doppler 02/13/2011. 12. Benign essential  tremor. He recently began propranolol. 13. Complaint of pain at the left abdomen of several weeks' duration when here 08/10/2011. Resolved. 14. Status post upper endoscopy 08/19/2011-there were no ulcers, erosions, masses or polyps noted. There was a small hiatal hernia.  Disposition-Nicholas Olsen appears stable. He remains in remission from the non-Hodgkin's lymphoma. The abdominal pain he was experiencing at the time of his last visit has resolved. He recently underwent an upper endoscopy which was negative except for a small hiatal hernia. We are referring him for a repeat noncontrast CT scan of the chest in February 2013 to followup the lung nodule. He will return for a followup visit with Dr. Truett Perna one week after the CT scan. He will contact the office the interim with any problems. Nicholas Olsen inquired about the shingles vaccine. We recommended that he wait at least one year following completion of chemotherapy/Rituxan.  Plan reviewed with Dr. Truett Perna.    Lonna Cobb ANP/GNP-BC

## 2011-11-10 ENCOUNTER — Other Ambulatory Visit: Payer: Self-pay | Admitting: Gastroenterology

## 2011-11-10 DIAGNOSIS — R109 Unspecified abdominal pain: Secondary | ICD-10-CM

## 2011-11-13 ENCOUNTER — Other Ambulatory Visit: Payer: Self-pay | Admitting: *Deleted

## 2011-11-13 ENCOUNTER — Telehealth: Payer: Self-pay | Admitting: Oncology

## 2011-11-13 DIAGNOSIS — C8330 Diffuse large B-cell lymphoma, unspecified site: Secondary | ICD-10-CM

## 2011-11-13 NOTE — Telephone Encounter (Signed)
called pts wife and provided appts  for [phone]

## 2011-11-16 ENCOUNTER — Ambulatory Visit
Admission: RE | Admit: 2011-11-16 | Discharge: 2011-11-16 | Disposition: A | Discharge status: Home / Self Care | Payer: Medicare PPO | Source: Ambulatory Visit | Attending: Gastroenterology | Admitting: Gastroenterology

## 2011-11-16 DIAGNOSIS — R109 Unspecified abdominal pain: Secondary | ICD-10-CM

## 2011-11-16 MED ORDER — IOHEXOL 300 MG/ML  SOLN
100.0000 mL | Freq: Once | INTRAMUSCULAR | Status: AC | PRN
  Administered 2011-11-16: 100 mL via INTRAVENOUS

## 2011-11-17 ENCOUNTER — Encounter: Payer: Self-pay | Admitting: Cardiovascular Disease

## 2011-11-20 ENCOUNTER — Ambulatory Visit: Payer: No Typology Code available for payment source | Admitting: Family Medicine

## 2011-12-07 ENCOUNTER — Ambulatory Visit (HOSPITAL_COMMUNITY)
Admission: RE | Admit: 2011-12-07 | Discharge: 2011-12-07 | Disposition: A | Discharge status: Home / Self Care | Payer: Medicare PPO | Source: Ambulatory Visit | Attending: Nurse Practitioner | Admitting: Nurse Practitioner

## 2011-12-07 DIAGNOSIS — C8589 Other specified types of non-Hodgkin lymphoma, extranodal and solid organ sites: Secondary | ICD-10-CM | POA: Insufficient documentation

## 2011-12-07 DIAGNOSIS — R911 Solitary pulmonary nodule: Secondary | ICD-10-CM | POA: Insufficient documentation

## 2011-12-07 DIAGNOSIS — C61 Malignant neoplasm of prostate: Secondary | ICD-10-CM | POA: Insufficient documentation

## 2011-12-07 DIAGNOSIS — R059 Cough, unspecified: Secondary | ICD-10-CM | POA: Insufficient documentation

## 2011-12-07 DIAGNOSIS — R05 Cough: Secondary | ICD-10-CM | POA: Insufficient documentation

## 2011-12-07 DIAGNOSIS — C8330 Diffuse large B-cell lymphoma, unspecified site: Secondary | ICD-10-CM

## 2011-12-14 ENCOUNTER — Telehealth: Payer: Self-pay | Admitting: *Deleted

## 2011-12-14 ENCOUNTER — Ambulatory Visit: Payer: Medicare PPO | Admitting: Oncology

## 2011-12-14 NOTE — Telephone Encounter (Signed)
Message copied by Wandalee Ferdinand on Mon Dec 14, 2011  3:41 PM ------      Message from: Ladene Artist      Created: Fri Dec 11, 2011 12:30 PM       Please call patient, CT without evidence of cancer.  Lung nodules are stable.  F/u as scheduled

## 2011-12-14 NOTE — Telephone Encounter (Signed)
Left VM for patient to call back for results.

## 2011-12-16 ENCOUNTER — Telehealth: Payer: Self-pay | Admitting: Family Medicine

## 2011-12-16 ENCOUNTER — Ambulatory Visit (INDEPENDENT_AMBULATORY_CARE_PROVIDER_SITE_OTHER): Payer: No Typology Code available for payment source | Admitting: Family Medicine

## 2011-12-16 ENCOUNTER — Encounter: Payer: Self-pay | Admitting: Family Medicine

## 2011-12-16 VITALS — BP 102/64 | HR 84 | Temp 97.9°F | Ht 73.0 in | Wt 208.0 lb

## 2011-12-16 DIAGNOSIS — I10 Essential (primary) hypertension: Secondary | ICD-10-CM

## 2011-12-16 DIAGNOSIS — R413 Other amnesia: Secondary | ICD-10-CM

## 2011-12-16 DIAGNOSIS — E1159 Type 2 diabetes mellitus with other circulatory complications: Secondary | ICD-10-CM

## 2011-12-16 DIAGNOSIS — I798 Other disorders of arteries, arterioles and capillaries in diseases classified elsewhere: Secondary | ICD-10-CM

## 2011-12-16 NOTE — Telephone Encounter (Signed)
Wife is calling because she wants to discuss some concerns that she isn't sure will be brought up at his appt today.

## 2011-12-16 NOTE — Telephone Encounter (Signed)
Left VM at home # to call office to obtain results of his CT scan.

## 2011-12-16 NOTE — Patient Instructions (Signed)
Your exam and diabetes blood test today is fine. I will check your kidneys through blood work today and call tomorrow with results. I will call Vikki Ports and ask what she meant about pharmacologist. See me again in three months about your memory.  If it is not better, I plan to send you to a specialist for testing.  At the [facility], please ask them for the shingles vaccine which will be OK now that your cancer treatments are done.

## 2011-12-16 NOTE — Telephone Encounter (Signed)
Patient would like to speak with you directly due to some things that are seen at home that may not be seen during visit. She would rather explain them to you so that you are aware of them personally

## 2011-12-17 LAB — BASIC METABOLIC PANEL
BUN: 16 mg/dL (ref 6–23)
Chloride: 98 mEq/L (ref 96–112)
Creat: 0.96 mg/dL (ref 0.50–1.35)
Glucose, Bld: 173 mg/dL — ABNORMAL HIGH (ref 70–99)
Potassium: 3.6 mEq/L (ref 3.5–5.3)

## 2011-12-17 NOTE — Telephone Encounter (Signed)
Spoke to wife yesterday before visit and LM after visit.  She is concerned about dementia.  I addressed this in the visit note.

## 2011-12-18 DIAGNOSIS — F0150 Vascular dementia without behavioral disturbance: Secondary | ICD-10-CM | POA: Insufficient documentation

## 2011-12-18 NOTE — Assessment & Plan Note (Signed)
Well controled. 

## 2011-12-18 NOTE — Assessment & Plan Note (Signed)
Cannot rule out early dementia.  Certainly he has a host of medical problems that could be contributing.  Follow for three months.  Consider neuropsych testing if worsens.

## 2011-12-18 NOTE — Progress Notes (Signed)
  Subjective:    Patient ID: Nicholas Olsen, male    DOB: December 05, 1933, 78 y.o.   MRN: [id number]  HPI  Generally doing well.  Restarted work in his [employer].  Also[facility].  Wife had called me about concerns for developing dementia.  He feels his memory problems are stable to improving.  No obvious problems during interview, but he is slow to answer.   Diabetes is good following diet (except his wife serves large portions.)  A1C is improved. No chest pain, syncope, DOE or bleeding. Did not get shingles vaccine at [facility] because live virus and chemo.  He should be fine to get it now.    Review of Systems     Objective:   Physical Exam Cardiac RRR without m or g Lungs clear Abd benign Ext no edema        Assessment & Plan:

## 2011-12-21 ENCOUNTER — Telehealth: Payer: Self-pay | Admitting: Family Medicine

## 2011-12-21 NOTE — Telephone Encounter (Signed)
Pt states that he needs a refill of Inderal 20 mg. He also stated that it is written incorrectly. It should be 2 tablets three times a day. I will forward to pcp .Loralee Pacas [place]

## 2011-12-21 NOTE — Telephone Encounter (Signed)
Patient is calling about his medications through the [place].  The [place] fax # [phone], phone # is [phone], ext [phone] Attention: [facility].

## 2011-12-22 NOTE — Telephone Encounter (Signed)
Called and LM.  I will be happy to correct his med list and give a new Rx.  I just need to be clear whether he needs a written Rx or which pharm to send it to.

## 2011-12-23 NOTE — Telephone Encounter (Signed)
Called again and left message to clarify the pharmacy.

## 2011-12-24 ENCOUNTER — Telehealth: Payer: Self-pay | Admitting: Oncology

## 2011-12-24 ENCOUNTER — Ambulatory Visit (HOSPITAL_BASED_OUTPATIENT_CLINIC_OR_DEPARTMENT_OTHER): Payer: Medicare PPO | Admitting: Oncology

## 2011-12-24 ENCOUNTER — Other Ambulatory Visit: Payer: Medicare PPO | Admitting: Lab

## 2011-12-24 VITALS — BP 105/66 | HR 65 | Temp 97.3°F | Ht 73.0 in | Wt 206.1 lb

## 2011-12-24 DIAGNOSIS — C8589 Other specified types of non-Hodgkin lymphoma, extranodal and solid organ sites: Secondary | ICD-10-CM

## 2011-12-24 NOTE — Telephone Encounter (Signed)
appt made and printed for 04/25/12  aom

## 2011-12-24 NOTE — Progress Notes (Signed)
OFFICE PROGRESS NOTE   INTERVAL HISTORY:   He returns as scheduled. He feels well. He has returned to work. There is no fever, night sweats, or anorexia. The abdominal pain has resolved. He has no complaint.   Objective:  Vital signs in last 24 hours:  Blood pressure 105/66, pulse 65, temperature 97.3 F (36.3 C), temperature source Oral, height 6\' 1"  (1.854 m), weight 206 lb 1.6 oz (93.486 kg).    HEENT: Neck without mass Lymphatics: No cervical, supraclavicular, axillary, or inguinal nodes Resp: Lungs clear bilaterally Cardio: Regular rate and rhythm GI: No mass, nontender, no hepatosplenomegaly Vascular: No leg edema  X-rays: A CT of the chest on 11/17/2011 was compared to a CT from 06/11/2011-no pathologically enlarged mediastinal or axillary nodes. Scattered pulmonary nodules measure of a 6 mm in the right lower lobe. The largest nodule is stable from 06/11/2011, but decreased in size from 12 mm in February 2012. The gastric wall appears thickened, but under distended as before.   Medications: I have reviewed the patient's current medications.  Assessment/Plan: 1. 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 Staging PET scan was consistent with hypermetabolic lymphoma involving the stomach and perigastric lymph nodes. Bone marrow biopsy on 11/24/2010 was negative for evidence of involvement with lymphoma. He began treatment with CHOP/Rituxan on 12/04/2010. Restaging PET scan 03/03/2011 revealed resolution of hypermetabolic activity at perigastric lymph nodes and decrease in the hypermetabolic activity at the stomach. He completed the 6th and final cycle of systemic therapy on 03/27/2011. An upper endoscopy on 01/01/2011 and 08/19/2011 by Dr. Loreta Ave revealed no remaining tumor. 2. Hospitalization 03/12 through 12/25/2010 for a gastrointestinal bleed, presumably secondary to lymphoma, resolved. 3. Anemia secondary to gastrointestinal  bleeding and chemotherapy-resolved. 4. Hypermetabolic lung nodule, question primary lung cancer versus involvement of the lung with non-Hodgkin's lymphoma. The nodule was smaller and less hypermetabolic on the PET scan 03/05/2011 suggesting a benign process or lymphoma. The nodule was again smaller on the CT 06/11/2011. The restaging CT 12/07/2011 revealed a stable right lower lobe nodule. 5. History of abdominal pain, likely secondary to the gastric mass and surrounding lymphadenopathy. 6. Increased FDG activity of the pituitary gland on a PET scan 11/20/2010 and an MRI on 12/03/2010 with 2 hypermetabolic subcentimeter hypoenhancing lesions within the sella turcica that appeared to be distinct. The radiologist felt the differential diagnosis included a pituitary microadenoma, lymphomatous involvement of the pituitary, or combination of the two. The hypermetabolic activity of the pituitary persisted on the PET scan 03/05/2011. He was evaluated by Dr. Wynetta Emery and a repeat MRI of the brain revealed no significant change. Dr. Wynetta Emery feels the lesions are likely benign and recommends an observation approach. 7. Diabetes. 8. Hypertension. 9. History of prostate cancer. 10. History of arthralgias secondary to Neulasta, relieved with Tylenol. 11. History of left leg edema, status post negative Doppler 02/13/2011. 12. Benign essential tremor. He recently began propranolol. 13. Complaint of pain at the left abdomen of several weeks' duration when here 08/10/2011. Resolved. 14. Status post upper endoscopy 08/19/2011-there were no ulcers, erosions, masses or polyps noted. There was a small hiatal hernia.   Disposition:  He appears to be in clinical remission from the non-Hodgkin's lymphoma. I recommended against the herpes zoster vaccine for now.  He will return for an office visit in 4 months.   Lucile Shutters, MD  12/24/2011  11:50 AM

## 2011-12-29 ENCOUNTER — Telehealth: Payer: Self-pay | Admitting: Family Medicine

## 2011-12-29 DIAGNOSIS — G252 Other specified forms of tremor: Secondary | ICD-10-CM

## 2011-12-29 MED ORDER — PROPRANOLOL HCL 20 MG PO TABS: 20.0000 mg | ORAL_TABLET | Freq: Three times a day (TID) | ORAL | Status: DC

## 2011-12-29 NOTE — Telephone Encounter (Signed)
Nicholas Olsen is out of his propanolol and need refill [facility]ed to [facility] [address] in [place].  He is totally out of medicine.  [facility] have just [facility] up pharmacy to start distributing his med, but he need refill now.  Will have them process this later.

## 2011-12-29 NOTE — Telephone Encounter (Signed)
Rx sent as requested.

## 2012-01-11 ENCOUNTER — Other Ambulatory Visit: Payer: Self-pay | Admitting: Family Medicine

## 2012-01-11 MED ORDER — OMEPRAZOLE 40 MG PO CPDR: 40.0000 mg | DELAYED_RELEASE_CAPSULE | Freq: Every day | ORAL | Status: DC

## 2012-01-11 NOTE — Assessment & Plan Note (Signed)
Refill as requested 

## 2012-01-11 NOTE — Telephone Encounter (Signed)
Patient is wanting a refill on Prilosec, [facility] [address].  Patient would not say if he contacted his Pharmacy.

## 2012-04-20 ENCOUNTER — Encounter: Payer: Self-pay | Admitting: Family Medicine

## 2012-04-20 ENCOUNTER — Ambulatory Visit (INDEPENDENT_AMBULATORY_CARE_PROVIDER_SITE_OTHER): Payer: Medicare PPO | Admitting: Family Medicine

## 2012-04-20 VITALS — BP 128/81 | HR 84 | Temp 98.7°F | Ht 73.0 in | Wt 208.2 lb

## 2012-04-20 DIAGNOSIS — E7800 Pure hypercholesterolemia, unspecified: Secondary | ICD-10-CM

## 2012-04-20 DIAGNOSIS — I798 Other disorders of arteries, arterioles and capillaries in diseases classified elsewhere: Secondary | ICD-10-CM

## 2012-04-20 DIAGNOSIS — Z79899 Other long term (current) drug therapy: Secondary | ICD-10-CM

## 2012-04-20 DIAGNOSIS — I10 Essential (primary) hypertension: Secondary | ICD-10-CM

## 2012-04-20 DIAGNOSIS — R413 Other amnesia: Secondary | ICD-10-CM

## 2012-04-20 DIAGNOSIS — E1159 Type 2 diabetes mellitus with other circulatory complications: Secondary | ICD-10-CM

## 2012-04-20 DIAGNOSIS — E039 Hypothyroidism, unspecified: Secondary | ICD-10-CM

## 2012-04-20 LAB — COMPREHENSIVE METABOLIC PANEL
ALT: 18 U/L (ref 0–53)
AST: 24 U/L (ref 0–37)
Calcium: 9.9 mg/dL (ref 8.4–10.5)
Chloride: 98 mEq/L (ref 96–112)
Creat: 0.82 mg/dL (ref 0.50–1.35)
Sodium: 138 mEq/L (ref 135–145)
Total Protein: 7.8 g/dL (ref 6.0–8.3)

## 2012-04-20 LAB — CBC
MCV: 90.7 fL (ref 78.0–100.0)
Platelets: 230 10*3/uL (ref 150–400)
RBC: 4.09 MIL/uL — ABNORMAL LOW (ref 4.22–5.81)
RDW: 14.8 % (ref 11.5–15.5)
WBC: 5.3 10*3/uL (ref 4.0–10.5)

## 2012-04-20 MED ORDER — DONEPEZIL HCL 5 MG PO TABS: 5.0000 mg | ORAL_TABLET | Freq: Every day | ORAL | Status: DC

## 2012-04-20 MED ORDER — PRAVASTATIN SODIUM 40 MG PO TABS: 40.0000 mg | ORAL_TABLET | Freq: Every evening | ORAL | Status: DC

## 2012-04-20 NOTE — Patient Instructions (Addendum)
I am prescribing two new medicines. Pravachol is for cholesterol.  One pill every evening.  Take that medicine alone for two weeks before starting the second med.  I would like to recheck your cholesterol in 3 months after being on that medicine. The second medicine is aricept for memory.  You should call me a week before this first month prescription runs out.  After that, you will take a higher dose (10 mg daily.)

## 2012-04-21 NOTE — Assessment & Plan Note (Signed)
Consistently normal TSH off meds.  Will resolve this problem

## 2012-04-21 NOTE — Assessment & Plan Note (Signed)
I am now convinced he has early dementia.  Unclear whether vascular or Altzheimers.  Will look for reversible causes and start aricept.

## 2012-04-21 NOTE — Assessment & Plan Note (Signed)
Good control

## 2012-04-21 NOTE — Assessment & Plan Note (Signed)
Finally willing to retry statin.  Will start with low potency.  This may not get [place] to goal, but some improvement better than none.

## 2012-04-21 NOTE — Progress Notes (Signed)
  Subjective:    Patient ID: Nicholas Olsen, male    DOB: Jul 01, 1934, 78 y.o.   MRN: [id number]  HPI Major concern by wife about dementia.  Seems to have progressive memory problems.  Got lost driving to [facility] and previously had excellent sense of direction.  DM taking meds.  A!C less than ideal BP good Reviewed previous head imaging and they were surprised to hear he had a previous CVA.  On a good note, he is now willing to retry statin for known increased LDL   Review of Systems     Objective:   Physical Exam Neuro no focal deficits.  Could only recall 1 of three items on testing.  Answers to questions are slow and incomplete.  Looks to wife to answer.         Assessment & Plan:

## 2012-04-21 NOTE — Assessment & Plan Note (Signed)
Poor control but no adjustments this visit.

## 2012-04-25 ENCOUNTER — Ambulatory Visit (HOSPITAL_BASED_OUTPATIENT_CLINIC_OR_DEPARTMENT_OTHER): Payer: Medicare PPO | Admitting: Nurse Practitioner

## 2012-04-25 ENCOUNTER — Telehealth: Payer: Self-pay | Admitting: Oncology

## 2012-04-25 VITALS — BP 116/68 | HR 60 | Temp 97.2°F | Ht 73.0 in | Wt 211.7 lb

## 2012-04-25 DIAGNOSIS — R911 Solitary pulmonary nodule: Secondary | ICD-10-CM

## 2012-04-25 DIAGNOSIS — Z8546 Personal history of malignant neoplasm of prostate: Secondary | ICD-10-CM

## 2012-04-25 DIAGNOSIS — E237 Disorder of pituitary gland, unspecified: Secondary | ICD-10-CM

## 2012-04-25 DIAGNOSIS — E119 Type 2 diabetes mellitus without complications: Secondary | ICD-10-CM

## 2012-04-25 DIAGNOSIS — C8589 Other specified types of non-Hodgkin lymphoma, extranodal and solid organ sites: Secondary | ICD-10-CM

## 2012-04-25 DIAGNOSIS — C8330 Diffuse large B-cell lymphoma, unspecified site: Secondary | ICD-10-CM

## 2012-04-25 NOTE — Progress Notes (Signed)
OFFICE PROGRESS NOTE  Interval history:  Nicholas Olsen returns as scheduled. He feels well. No fevers or sweats. No anorexia or weight loss. He denies pain. His wife reports  progressive memory deficit. He is scheduled to begin a trial of Aricept later this month. He denies any unusual headaches or vision change. No focal extremity weakness.   Objective: Blood pressure 116/68, pulse 60, temperature 97.2 F (36.2 C), temperature source Oral, height 6\' 1"  (1.854 m), weight 211 lb 11.2 oz (96.026 kg).  Oropharynx is without thrush or ulceration. No palpable cervical, supraclavicular, axillary or inguinal lymph nodes. Lungs are clear. No wheezes or rales. Regular cardiac rhythm. Abdomen is soft and nontender. No mass. No organomegaly. Extremities are without edema. He is alert. He looks to his wife for assistance with some questions.  Lab Results: Lab Results  Component Value Date   WBC 5.3 04/20/2012   HGB 13.1 04/20/2012   HCT 37.1* 04/20/2012   MCV 90.7 04/20/2012   PLT 230 04/20/2012    Chemistry:    Chemistry      Component Value Date/Time   NA 138 04/20/2012 1511   K 3.5 04/20/2012 1511   CL 98 04/20/2012 1511   CO2 28 04/20/2012 1511   BUN 18 04/20/2012 1511   CREATININE 0.82 04/20/2012 1511   CREATININE 0.60 03/27/2011 0910      Component Value Date/Time   CALCIUM 9.9 04/20/2012 1511   ALKPHOS 78 04/20/2012 1511   AST 24 04/20/2012 1511   ALT 18 04/20/2012 1511   BILITOT 0.7 04/20/2012 1511       Studies/Results: No results found.  Medications: I have reviewed the patient's current medications.  Assessment/Plan:  1. 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 Staging PET scan was consistent with hypermetabolic lymphoma involving the stomach and perigastric lymph nodes. Bone marrow biopsy on 11/24/2010 was negative for evidence of involvement with lymphoma. He began treatment with CHOP/Rituxan on 12/04/2010. Restaging PET scan  03/03/2011 revealed resolution of hypermetabolic activity at perigastric lymph nodes and decrease in the hypermetabolic activity at the stomach. He completed the 6th and final cycle of systemic therapy on 03/27/2011. An upper endoscopy on 01/01/2011 and 08/19/2011 by Dr. Loreta Ave revealed no remaining tumor. CT scans of February 2013 showed no evidence of lymphoma. 2. Hospitalization 03/12 through 12/25/2010 for a gastrointestinal bleed, presumably secondary to lymphoma, resolved. 3. Anemia secondary to gastrointestinal bleeding and chemotherapy-resolved. 4. Hypermetabolic lung nodule, question primary lung cancer versus involvement of the lung with non-Hodgkin's lymphoma. The nodule was smaller and less hypermetabolic on the PET scan 03/05/2011 suggesting a benign process or lymphoma. The nodule was again smaller on the CT 06/11/2011. The restaging CT 12/07/2011 revealed a stable right lower lobe nodule. 5. History of abdominal pain, likely secondary to the gastric mass and surrounding lymphadenopathy. 6. Increased FDG activity of the pituitary gland on a PET scan 11/20/2010 and an MRI on 12/03/2010 with 2 hypermetabolic subcentimeter hypoenhancing lesions within the sella turcica that appeared to be distinct. The radiologist felt the differential diagnosis included a pituitary microadenoma, lymphomatous involvement of the pituitary, or combination of the two. The hypermetabolic activity of the pituitary persisted on the PET scan 03/05/2011. He was evaluated by Dr. Wynetta Emery and a repeat MRI of the brain revealed no significant change. Dr. Wynetta Emery feels the lesions are likely benign and recommends an observation approach with serial scans. 7. Diabetes. 8. Hypertension. 9. History of prostate cancer. 10. History of arthralgias secondary to Neulasta,  relieved with Tylenol. 11. History of left leg edema, status post negative Doppler 02/13/2011. 12. Benign essential tremor. He recently began propranolol. 13. Complaint  of pain at the left abdomen of several weeks' duration when here 08/10/2011. Resolved. 14. Status post upper endoscopy 08/19/2011-there were no ulcers, erosions, masses or polyps noted. There was a small hiatal hernia. 15. Progressive memory deficit.  Disposition-[name] remains in clinical remission from the non-Hodgkin's lymphoma. We are referring him for a followup MRI of the brain to reevaluate the pituitary lesions. [name] wife will contact Dr. Wynetta Emery office to schedule a followup appointment. He will return for a followup visit here in 4 months. He will contact the office in the interim with any problems.  Plan reviewed with Dr. Truett Perna.  Lonna Cobb ANP/GNP-BC

## 2012-04-25 NOTE — Telephone Encounter (Signed)
Gave pt appt for November MD only then in July 24/13 MRIof head

## 2012-04-29 ENCOUNTER — Other Ambulatory Visit (HOSPITAL_COMMUNITY): Payer: Medicare PPO

## 2012-05-01 ENCOUNTER — Other Ambulatory Visit: Payer: Self-pay | Admitting: Family Medicine

## 2012-05-04 ENCOUNTER — Ambulatory Visit (HOSPITAL_COMMUNITY)
Admission: RE | Admit: 2012-05-04 | Discharge: 2012-05-04 | Disposition: A | Discharge status: Home / Self Care | Payer: Medicare PPO | Source: Ambulatory Visit | Attending: Nurse Practitioner | Admitting: Nurse Practitioner

## 2012-05-04 DIAGNOSIS — E237 Disorder of pituitary gland, unspecified: Secondary | ICD-10-CM | POA: Insufficient documentation

## 2012-05-04 MED ORDER — GADOBENATE DIMEGLUMINE 529 MG/ML IV SOLN
15.0000 mL | Freq: Once | INTRAVENOUS | Status: AC | PRN
  Administered 2012-05-04: 15 mL via INTRAVENOUS

## 2012-05-05 ENCOUNTER — Telehealth: Payer: Self-pay | Admitting: *Deleted

## 2012-05-05 NOTE — Telephone Encounter (Signed)
Home voice mail box "full". Left message on mobile to call office for results. Routed copy of report to Dr. Wynetta Emery.

## 2012-05-05 NOTE — Telephone Encounter (Signed)
Message copied by Wandalee Ferdinand on Thu May 05, 2012  2:47 PM ------      Message from: Thornton Papas B      Created: Wed May 04, 2012  9:54 PM       Please call patient,  One of the pituitary masses is slightly larger, no other acute brain findings.      Copy report to Dr.Cram, f/u as scheduled      Patient should schedule appt. With Dr.Cram

## 2012-05-09 ENCOUNTER — Telehealth: Payer: Self-pay | Admitting: *Deleted

## 2012-05-09 NOTE — Telephone Encounter (Signed)
Message copied by Wandalee Ferdinand on Mon May 09, 2012  4:09 PM ------      Message from: Thornton Papas B      Created: Wed May 04, 2012  9:54 PM       Please call patient,  One of the pituitary masses is slightly larger, no other acute brain findings.      Copy report to Dr.Cram, f/u as scheduled      Patient should schedule appt. With Dr.Cram

## 2012-05-09 NOTE — Telephone Encounter (Signed)
Made wife aware of MRI results and that one of pituitary masses is 1-33mm larger than on last study. She confirms they have appointment with Dr. Wynetta Emery for 05/24/12. Copy of report has been routed to him.

## 2012-05-17 ENCOUNTER — Other Ambulatory Visit: Payer: Self-pay | Admitting: Family Medicine

## 2012-05-18 MED ORDER — DONEPEZIL HCL 10 MG PO TABS: 10.0000 mg | ORAL_TABLET | Freq: Every evening | ORAL | Status: DC | PRN

## 2012-05-18 NOTE — Telephone Encounter (Signed)
Increase dose per routine.

## 2012-05-25 ENCOUNTER — Ambulatory Visit (INDEPENDENT_AMBULATORY_CARE_PROVIDER_SITE_OTHER): Payer: Medicare PPO | Admitting: Family Medicine

## 2012-05-25 ENCOUNTER — Encounter: Payer: Self-pay | Admitting: Family Medicine

## 2012-05-25 VITALS — BP 127/77 | HR 63 | Temp 97.4°F | Ht 73.0 in | Wt 209.5 lb

## 2012-05-25 DIAGNOSIS — F0390 Unspecified dementia without behavioral disturbance: Secondary | ICD-10-CM

## 2012-05-25 DIAGNOSIS — E7800 Pure hypercholesterolemia, unspecified: Secondary | ICD-10-CM

## 2012-05-25 DIAGNOSIS — I10 Essential (primary) hypertension: Secondary | ICD-10-CM

## 2012-05-25 DIAGNOSIS — C61 Malignant neoplasm of prostate: Secondary | ICD-10-CM

## 2012-05-25 DIAGNOSIS — R350 Frequency of micturition: Secondary | ICD-10-CM

## 2012-05-25 LAB — POCT URINALYSIS DIPSTICK
Bilirubin, UA: NEGATIVE
Glucose, UA: 100
Leukocytes, UA: NEGATIVE
Nitrite, UA: NEGATIVE
Urobilinogen, UA: 0.2

## 2012-05-25 LAB — POCT UA - MICROSCOPIC ONLY

## 2012-05-25 NOTE — Addendum Note (Signed)
Addended by: [place], Mearl Olver on: 05/25/2012 04:08 PM   Modules accepted: Orders

## 2012-05-25 NOTE — Progress Notes (Signed)
  Subjective:    Patient ID: Nicholas Olsen, male    DOB: [birth year], 78 y.o.   MRN: [id number]  HPI  Tolerating aricept well.  No side effects.  Feels memory has improved.  Wife still notes significant gaps in memory.  Just recently started 10 mg dose. Tolerating pravastatin well without complaints. C/O urinary frequency.  Denies fever, urgency or dysuria.  Feels that he completely empties bladder.  Hx of prostate ca - last PSA 12/2010 Has pituitary adenoma that is growing slowly per recent MRI.  No intervention planned for now.    Review of Systems     Objective:   Physical Exam Affect good.  Answers more questions himself this month.  Still has long pauses before answering. Abd benign. Diabetic foot exam normal.       Assessment & Plan:

## 2012-05-25 NOTE — Assessment & Plan Note (Signed)
Good control

## 2012-05-25 NOTE — Assessment & Plan Note (Signed)
Seems to have a positive response to aricept.

## 2012-05-25 NOTE — Assessment & Plan Note (Signed)
Tolerating statin well thus far

## 2012-05-25 NOTE — Assessment & Plan Note (Signed)
No evidence of infection.  Observe for now.

## 2012-05-25 NOTE — Patient Instructions (Addendum)
Double check your last eye exam.  My records indicate it was 06/05/11 which would make you due now. Double check that you have 10 mg donepezil (aricept.)

## 2012-05-25 NOTE — Assessment & Plan Note (Signed)
Check PSA. ?

## 2012-05-26 LAB — PSA: PSA: 0.07 ng/mL (ref <=4.00)

## 2012-06-23 ENCOUNTER — Ambulatory Visit (INDEPENDENT_AMBULATORY_CARE_PROVIDER_SITE_OTHER): Payer: Medicare PPO | Admitting: Family Medicine

## 2012-06-23 VITALS — BP 131/76 | HR 60 | Temp 97.8°F | Ht 74.0 in | Wt 205.0 lb

## 2012-06-23 DIAGNOSIS — F0390 Unspecified dementia without behavioral disturbance: Secondary | ICD-10-CM

## 2012-06-23 DIAGNOSIS — Z8639 Personal history of other endocrine, nutritional and metabolic disease: Secondary | ICD-10-CM | POA: Insufficient documentation

## 2012-06-23 DIAGNOSIS — E7800 Pure hypercholesterolemia, unspecified: Secondary | ICD-10-CM

## 2012-06-23 DIAGNOSIS — Z87898 Personal history of other specified conditions: Secondary | ICD-10-CM

## 2012-06-23 DIAGNOSIS — I798 Other disorders of arteries, arterioles and capillaries in diseases classified elsewhere: Secondary | ICD-10-CM

## 2012-06-23 DIAGNOSIS — C61 Malignant neoplasm of prostate: Secondary | ICD-10-CM

## 2012-06-23 DIAGNOSIS — I10 Essential (primary) hypertension: Secondary | ICD-10-CM

## 2012-06-23 DIAGNOSIS — F411 Generalized anxiety disorder: Secondary | ICD-10-CM

## 2012-06-23 DIAGNOSIS — Z23 Encounter for immunization: Secondary | ICD-10-CM

## 2012-06-23 DIAGNOSIS — E1159 Type 2 diabetes mellitus with other circulatory complications: Secondary | ICD-10-CM

## 2012-06-23 NOTE — Assessment & Plan Note (Signed)
Hopefully normal since he spends much time doing yardwork

## 2012-06-23 NOTE — Patient Instructions (Addendum)
Thank you for coming into clinic today! It was very nice to meet you. You did well on the memory test. Continue taking your donepezil (Aricept) 10 mg daily. Please make an appointment Dr. Leveda Anna in one month. He may want to check some labs at that point for your diabetes, your cholesterol, and possibly your vitamin D and B12 levels. Continue to stay physically active and play the guitar! Exercise your "right brain."  If you have any questions or concerns in the meantime, please don't hesitate to call the clinic. If you have any immediate problems or if the clinic is closed, call 911 or go to the emergency room. --Dr. Casper Harrison

## 2012-06-23 NOTE — Assessment & Plan Note (Addendum)
A: Recently started pravachol 40 in July. No side effects. Last lipid panel in October 2012, LDL 170. P: Consider recheck lipid panel next month and titrate statin as appropriate, with blood draw for other tests listed under A&P notes.

## 2012-06-23 NOTE — Assessment & Plan Note (Signed)
He has some poor sleep hygiene, but I agree with him that he is not depressed enough to require medication.

## 2012-06-23 NOTE — Assessment & Plan Note (Addendum)
A: Last A1c 2 months ago was 7.8. P: Continue metformin. Consider recheck A1c at next appt in one month.

## 2012-06-23 NOTE — Assessment & Plan Note (Addendum)
A: Doing well on Aricept with no side effects. No further instances of "getting lost" while driving. Good mood and affect today with appropriate interaction during interview/exam. MMS exam 29/30. The medication may have improved this, but he also may be recovering from the adverse effects of his chemotherapy.  P: Continue Aricept 10 mg daily. Repeat MMS exam in one year. Consider weaning off of this if there has been not loss of points. I encouraged picking up guitar playing again as he plans, and continued outdoor activity to stimulate both sides of the brain. Would check vitamin B12 level, particularly considering the treatment for the gastric lymphoma, and on Metformin and PPI.

## 2012-06-23 NOTE — Progress Notes (Signed)
  Subjective:    Patient ID: Nicholas Olsen, male    DOB: August 21, 1934, 78 y.o.   MRN: [id number]  HPI: Pt presents to geriatric clinic as a referral from Dr. Leveda Anna for dementia evaluation. Pt seen by Dr. Leveda Anna in July and started on donepezil 5 mg, increased to 10 in August. He and his wife state his memory seems to be somewhat better after starting donepezil. Denies any side effects. Continues to drive and has had no further problems being lost; previous notes show pt "got lost in [place]" but pt states "I wasn't lost, I just don't know [place] well," and denies any further problems navigating while driving.  Pt has a history of DM, hyperlipidemia, and HTN. Last A1c 7.8 in July. Recently started statin (last lipid panel about 1 year ago, LDL 170). BP reasonably controlled.  Of note, pt has a history of B-cell lymphoma with gastric ulcer involvement, treated with CHOP/Rituxan completed in June 2012 (followed by Dr. Truett Perna, next appt in November) and history of prostate cancer treated with seed implant 13 years ago. No current complaints related to these other than occasional stomach pain.  Pt desires a flu shot, today.  Review of Systems: As above. Specifically denies dysuria or increased urinary frequency and states he completely empties his bladder when he voids. Does state "when I have to go, I really have to go quick."     Objective:   Physical Exam BP 131/76  Pulse 60  Temp 97.8 F (36.6 C) (Oral)  Ht 6\' 2"  (1.88 m)  Wt 205 lb (92.987 kg)  BMI 26.32 kg/m2 Neuro/Psych:   normal mood with appropriate affect, some minimal slowing of answers but answers questions completely and appropriately himself without deferring to wife  Mini mental status exam 29/30 (see Documentation Flowsheet); one point lost for 3 item recall at 5 minutes (only able to remember 2/3)     Assessment & Plan:  Seen in geriatric clinic with Dr. Sheffield Slider. Doing well with Aricept, score 29/30 on MMS exam. No indication  for changing medications at this time. Consider checking vitamin D and B12 at next visit. See problem list notes for details.

## 2012-06-23 NOTE — Assessment & Plan Note (Signed)
Well controlled on losartan 100. Continue current dose.

## 2012-06-29 ENCOUNTER — Ambulatory Visit (INDEPENDENT_AMBULATORY_CARE_PROVIDER_SITE_OTHER): Payer: Medicare PPO | Admitting: Family Medicine

## 2012-06-29 ENCOUNTER — Encounter: Payer: Self-pay | Admitting: Family Medicine

## 2012-06-29 VITALS — BP 132/88 | HR 58 | Temp 98.1°F | Ht 74.0 in | Wt 206.0 lb

## 2012-06-29 DIAGNOSIS — B354 Tinea corporis: Secondary | ICD-10-CM | POA: Insufficient documentation

## 2012-06-29 MED ORDER — TERBINAFINE HCL 1 % EX CREA: TOPICAL_CREAM | Freq: Two times a day (BID) | CUTANEOUS | Status: DC

## 2012-06-29 NOTE — Progress Notes (Signed)
  Subjective:    Patient ID: Nicholas Olsen, male    DOB: February 03, 1934, 78 y.o.   MRN: [id number]  HPI  Skin Lesion Has an round slightly itchy are on R arm.  Thinks may be a bite but can't recall a speicfic  Instance.  No other skin problems.  Does not hurt, no oral problems, no fevers  PMH  Dementia  Review of Systems     Objective:   Physical Exam  Alert no acute distress R anterior foream 1.5 cm diameter round lesion with central clearing and periph scale. Mouth - no lesions, mucous membranes are moist, no lesions       Assessment & Plan:

## 2012-06-29 NOTE — Patient Instructions (Signed)
Use terbenafine cream Lamisil use twice daily on the spot on your arm.  It should take about 2-3 weeks to go away   Show it to Dr Leveda Anna the next time you see him

## 2012-06-29 NOTE — Assessment & Plan Note (Signed)
R foream lesion most consistent with tinea.  Therapeutic trial of lamisil twice daily.   Did not scrape as patient arrived 30 minutes late and lab was closed

## 2012-07-13 ENCOUNTER — Other Ambulatory Visit (HOSPITAL_COMMUNITY): Payer: Self-pay | Admitting: Neurosurgery

## 2012-07-13 DIAGNOSIS — D497 Neoplasm of unspecified behavior of endocrine glands and other parts of nervous system: Secondary | ICD-10-CM

## 2012-07-26 ENCOUNTER — Ambulatory Visit (HOSPITAL_COMMUNITY)
Admission: RE | Admit: 2012-07-26 | Discharge: 2012-07-26 | Disposition: A | Discharge status: Home / Self Care | Payer: Medicare PPO | Source: Ambulatory Visit | Attending: Neurosurgery | Admitting: Neurosurgery

## 2012-07-26 DIAGNOSIS — D497 Neoplasm of unspecified behavior of endocrine glands and other parts of nervous system: Secondary | ICD-10-CM

## 2012-07-26 LAB — CREATININE, SERUM: GFR calc Af Amer: >90 mL/min (ref >90)

## 2012-07-26 MED ORDER — GADOBENATE DIMEGLUMINE 529 MG/ML IV SOLN
15.0000 mL | Freq: Once | INTRAVENOUS | Status: AC
  Administered 2012-07-26: 15 mL via INTRAVENOUS

## 2012-08-10 ENCOUNTER — Encounter: Payer: Self-pay | Admitting: Family Medicine

## 2012-08-10 ENCOUNTER — Ambulatory Visit (INDEPENDENT_AMBULATORY_CARE_PROVIDER_SITE_OTHER): Payer: Medicare PPO | Admitting: Family Medicine

## 2012-08-10 VITALS — BP 108/62 | HR 58 | Temp 97.7°F | Ht 74.0 in | Wt 208.5 lb

## 2012-08-10 DIAGNOSIS — E7800 Pure hypercholesterolemia, unspecified: Secondary | ICD-10-CM

## 2012-08-10 DIAGNOSIS — I10 Essential (primary) hypertension: Secondary | ICD-10-CM

## 2012-08-10 DIAGNOSIS — F0150 Vascular dementia without behavioral disturbance: Secondary | ICD-10-CM

## 2012-08-10 DIAGNOSIS — E1159 Type 2 diabetes mellitus with other circulatory complications: Secondary | ICD-10-CM

## 2012-08-10 DIAGNOSIS — Z79899 Other long term (current) drug therapy: Secondary | ICD-10-CM

## 2012-08-10 LAB — POCT GLYCOSYLATED HEMOGLOBIN (HGB A1C): Hemoglobin A1C: 7.8

## 2012-08-10 NOTE — Patient Instructions (Addendum)
Diabetes is not quite at goal. I want you to eat healthier, no sweets and get down to 190 lbs. Watch your blood pressure at home.  It was on the low side today.  If the top number is consistently below 120, I may want to decrease your blood pressure medications.  You also want the top number below 140 See me in 3 months.

## 2012-08-11 LAB — LDL CHOLESTEROL, DIRECT: Direct LDL: 120 mg/dL — ABNORMAL HIGH

## 2012-08-11 NOTE — Assessment & Plan Note (Addendum)
Good, perhaps too good control.  Monitor.  Consider decrease meds.

## 2012-08-11 NOTE — Assessment & Plan Note (Signed)
Improved on aricept, continue.

## 2012-08-11 NOTE — Assessment & Plan Note (Signed)
Recheck LDL on pravachol, which he seems to be tolerating.

## 2012-08-11 NOTE — Progress Notes (Signed)
  Subjective:    Patient ID: IDO WOLLMAN, male    DOB: [birth year], 78 y.o.   MRN: [id number]  HPI Doing well.  Improved memory on aricept.   A1C is a bit high.  He admits to less exercise and dietary indiscretion.  He will buckle down.   No lightheaded spells    Review of Systems     Objective:   Physical Exam Lowish BP noted Wt noted. Able to carry on good conversation with less halting and less reliance on wife.       Assessment & Plan:

## 2012-08-11 NOTE — Assessment & Plan Note (Signed)
Suboptimal control.  Diet and exercise.  Goal wt = 190 lbs.

## 2012-08-26 ENCOUNTER — Ambulatory Visit (HOSPITAL_BASED_OUTPATIENT_CLINIC_OR_DEPARTMENT_OTHER): Payer: Medicare PPO | Admitting: Oncology

## 2012-08-26 ENCOUNTER — Telehealth: Payer: Self-pay | Admitting: Oncology

## 2012-08-26 VITALS — BP 126/76 | HR 65 | Temp 97.8°F | Resp 20 | Ht 74.0 in | Wt 205.9 lb

## 2012-08-26 DIAGNOSIS — E237 Disorder of pituitary gland, unspecified: Secondary | ICD-10-CM

## 2012-08-26 DIAGNOSIS — Z8546 Personal history of malignant neoplasm of prostate: Secondary | ICD-10-CM

## 2012-08-26 DIAGNOSIS — R911 Solitary pulmonary nodule: Secondary | ICD-10-CM

## 2012-08-26 DIAGNOSIS — C8583 Other specified types of non-Hodgkin lymphoma, intra-abdominal lymph nodes: Secondary | ICD-10-CM

## 2012-08-26 NOTE — Progress Notes (Signed)
   [facility]    OFFICE PROGRESS NOTE   INTERVAL HISTORY:   He returns as scheduled. He feels well. Good appetite and energy level. No pain. No fever or night sweats.  He continues to followup with Dr. Wynetta Emery for the pituitary lesions. The lesions were unchanged on a repeat MRI 07/26/2012 compared to an MRI from 05/04/2012.  Objective:  Vital signs in last 24 hours:  Blood pressure 126/76, pulse 65, temperature 97.8 F (36.6 C), temperature source Oral, resp. rate 20, height 6\' 2"  (1.88 m), weight 205 lb 14.4 oz (93.396 kg).    HEENT: Neck without mass Lymphatics: No cervical, supraclavicular, axillary, or inguinal nodes Resp: Lungs clear bilaterally Cardio: Regular rate and rhythm GI: No hepatosplenomegaly, no mass, nontender Vascular: No leg edema    Medications: I have reviewed the patient's current medications.  Assessment/Plan: 1. 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 Staging PET scan was consistent with hypermetabolic lymphoma involving the stomach and perigastric lymph nodes. Bone marrow biopsy on 11/24/2010 was negative for evidence of involvement with lymphoma. He began treatment with CHOP/Rituxan on 12/04/2010. Restaging PET scan 03/03/2011 revealed resolution of hypermetabolic activity at perigastric lymph nodes and decrease in the hypermetabolic activity at the stomach. He completed the 6th and final cycle of systemic therapy on 03/27/2011. An upper endoscopy on 01/01/2011 and 08/19/2011 by Dr. Loreta Ave revealed no remaining tumor. CT scans of February 2013 showed no evidence of lymphoma. 2. Hospitalization 03/12 through 12/25/2010 for a gastrointestinal bleed, presumably secondary to lymphoma, resolved. 3. Anemia secondary to gastrointestinal bleeding and chemotherapy-resolved. 4. Hypermetabolic lung nodule, question primary lung cancer versus involvement of the lung with non-Hodgkin's lymphoma. The nodule  was smaller and less hypermetabolic on the PET scan 03/05/2011 suggesting a benign process or lymphoma. The nodule was again smaller on the CT 06/11/2011. The restaging CT 12/07/2011 revealed a stable right lower lobe nodule. 5. History of abdominal pain, likely secondary to the gastric mass and surrounding lymphadenopathy. 6. Increased FDG activity of the pituitary gland on a PET scan 11/20/2010 and an MRI on 12/03/2010 with 2 hypermetabolic subcentimeter hypoenhancing lesions within the sella turcica that appeared to be distinct. The radiologist felt the differential diagnosis included a pituitary microadenoma, lymphomatous involvement of the pituitary, or combination of the two. The hypermetabolic activity of the pituitary persisted on the PET scan 03/05/2011. He was evaluated by Dr. Wynetta Emery and a repeat MRI of the brain revealed no significant change. Dr. Wynetta Emery feels the lesions are likely benign and recommends an observation approach with serial scans. The lesions were unchanged on an MRI of the brain 07/26/2012 7. Diabetes. 8. Hypertension. 9. History of prostate cancer. 10. History of arthralgias secondary to Neulasta, relieved with Tylenol. 11. History of left leg edema, status post negative Doppler 02/13/2011. 12. Benign essential tremor. He takes propranolol. 13. Complaint of pain at the left abdomen of several weeks' duration when here 08/10/2011. Resolved. 14. Status post upper endoscopy 08/19/2011-there were no ulcers, erosions, masses or polyps noted. There was a small hiatal hernia. 15. Progressive memory deficit.   Disposition:  Mr. Blegen remains in clinical remission from the non-Hodgkin's lymphoma. He will return for an office visit in 4 months. He will be scheduled for a followup CT of the chest in February of 2014 to followup on lung nodules.   Thornton Papas, MD  08/26/2012  5:48 PM

## 2012-08-26 NOTE — Telephone Encounter (Signed)
gv pt wife appt schedule for march 2014. Wife aware central will contact her ct appt to be done approx 12/01/12.

## 2012-09-28 ENCOUNTER — Encounter: Payer: Self-pay | Admitting: Family Medicine

## 2012-09-28 ENCOUNTER — Ambulatory Visit (INDEPENDENT_AMBULATORY_CARE_PROVIDER_SITE_OTHER): Payer: Medicare PPO | Admitting: Family Medicine

## 2012-09-28 VITALS — BP 119/62 | HR 66 | Temp 98.0°F | Ht 74.0 in | Wt 208.0 lb

## 2012-09-28 DIAGNOSIS — N200 Calculus of kidney: Secondary | ICD-10-CM

## 2012-09-28 DIAGNOSIS — F0150 Vascular dementia without behavioral disturbance: Secondary | ICD-10-CM

## 2012-09-28 LAB — POCT URINALYSIS DIPSTICK
Nitrite, UA: NEGATIVE
Protein, UA: NEGATIVE
Spec Grav, UA: 1.02
Urobilinogen, UA: 0.2

## 2012-09-28 MED ORDER — MEMANTINE HCL 28 X 5 MG & 21 X 10 MG PO TABS: ORAL_TABLET | ORAL | Status: DC

## 2012-09-29 NOTE — Progress Notes (Signed)
  Subjective:    Patient ID: Nicholas Olsen, male    DOB: 30-Dec-1933, 78 y.o.   MRN: [id number]  HPI  [facility] with [facility] and work up of memory loss/dementia.  Nicholas Olsen is stable - with some new concerns.  He is seen by ENT at [facility] for service related hearing loss,  He now tells them that he is hearing music and voices.  No ENT cause.  Likely related to his dementia.  [facility] plans thorough [facility], which I am pleased to have the second opinion.  They wanted a UA (I guess for UTI related delerium)  They plan psych eval.  Also suggested namenda which I am pleased to add.  Nicholas Olsen is concerned that he is on too much medication.  I struck a deal with him to come back in one month with all his meds.  I promised to stop at least one med then.  He agreed to take my new med now.    Review of Systems     Objective:   Physical Exam  Healthy appearing and slowly participates in the conversation.  He did get lost on his way back to the exam room after giving a urine specimen.  Normal LOC  No focal neuro deficits.      Assessment & Plan:

## 2012-09-29 NOTE — Patient Instructions (Addendum)
Please keep your follow up with the [facility].  I hope they find something treatable Please take your new medication daily. See me in one month and bring all your meds.  I promise to stop at least one medication.

## 2012-09-29 NOTE — Assessment & Plan Note (Addendum)
I am increasingly convinced that he has progressive dementia which explains the majority of his current symptoms.  Wife accompanied patient and we had a long conversation about prognosis.  She will likely retire from work this year recognizing that she will need to provide more care for him.  Greater than 50% of the visit was counseling and the visit duration was 45 minutes.

## 2012-11-28 ENCOUNTER — Other Ambulatory Visit: Payer: Self-pay | Admitting: *Deleted

## 2012-11-28 ENCOUNTER — Other Ambulatory Visit: Payer: Self-pay | Admitting: Gastroenterology

## 2012-11-28 DIAGNOSIS — E7800 Pure hypercholesterolemia, unspecified: Secondary | ICD-10-CM

## 2012-11-28 DIAGNOSIS — F0150 Vascular dementia without behavioral disturbance: Secondary | ICD-10-CM

## 2012-11-28 MED ORDER — PRAVASTATIN SODIUM 40 MG PO TABS: 40.0000 mg | ORAL_TABLET | Freq: Every evening | ORAL | Status: DC

## 2012-11-28 MED ORDER — DONEPEZIL HCL 10 MG PO TABS: 10.0000 mg | ORAL_TABLET | Freq: Every day | ORAL | Status: DC

## 2012-11-28 MED ORDER — MEMANTINE HCL 28 X 5 MG & 21 X 10 MG PO TABS: ORAL_TABLET | ORAL | Status: DC

## 2012-12-01 IMAGING — PT NM PET TUM IMG INITIAL (PI) SKULL BASE T - THIGH
6 series · 25 of 25 positions shown · non-contrast
Comparison: CT 07/29/2010

CLINICAL DATA: Initial treatment strategy for non Hodgkin's
lymphoma.

NUCLEAR MEDICINE PET SKULL BASE TO THIGH
Fasting Blood Glucose:  Prostate cancer and non Hodgkin's lymphoma
TECHNIQUE: 144 mCi F-18 FDG was injected intravenously via the
right antecubital fossa.  Full-ring PET imaging was performed from
the skull base through the mid-thighs 60  minutes after injection.
CT data was obtained and used for attenuation correction and
anatomic localization only.  (This was not acquired as a diagnostic
CT examination.)

[Series 1: pet ac · axial · 3.3mm · 4.69mm/px · z∈[-877,-7]mm · 5 of 267 slices shown]
[im 1/267]
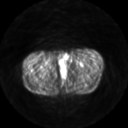
[im 67/267]
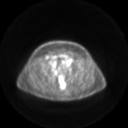
[im 134/267]
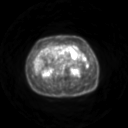
[im 200/267]
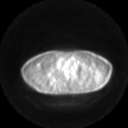
[im 267/267]
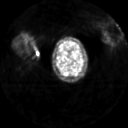

[Series 2: pet nac · axial · 3.3mm · 4.69mm/px · z∈[-877,-7]mm · 6 of 267 slices shown]
[im 1/267]
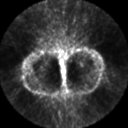
[im 54/267]
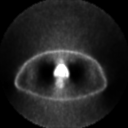
[im 107/267]
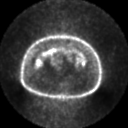
[im 160/267]
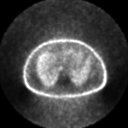
[im 213/267]
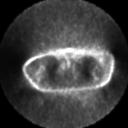
[im 267/267]
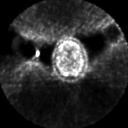

[Series 2: ct images · axial · 3.8mm · 0.98mm/px · z∈[-877,-8]mm · 6 of 264 slices shown]
[im 1/264]
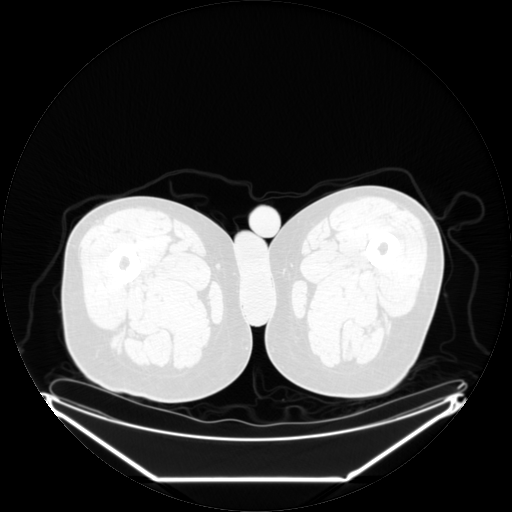
[im 53/264]
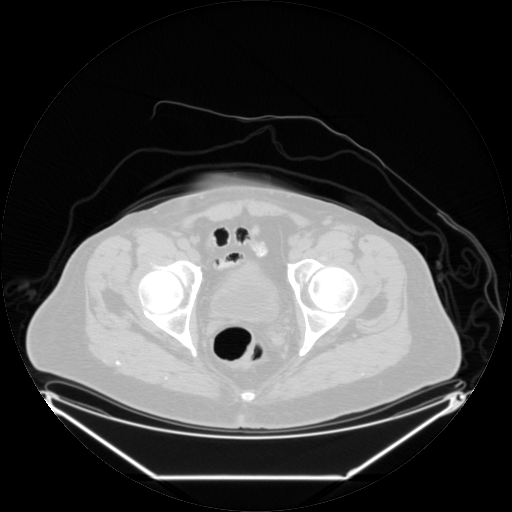
[im 106/264]
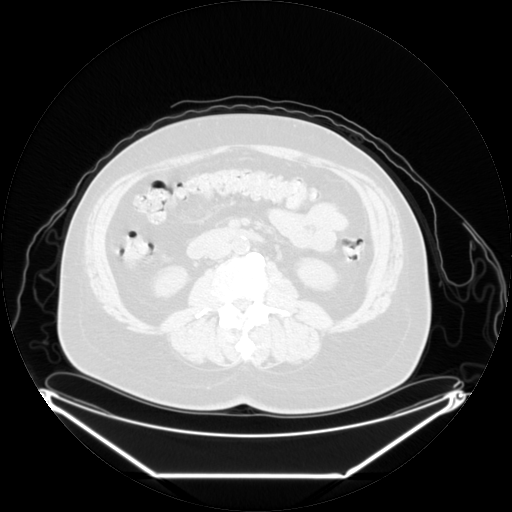
[im 158/264]
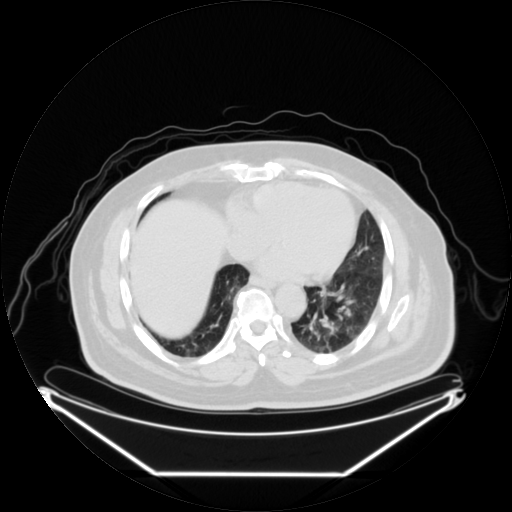
[im 211/264]
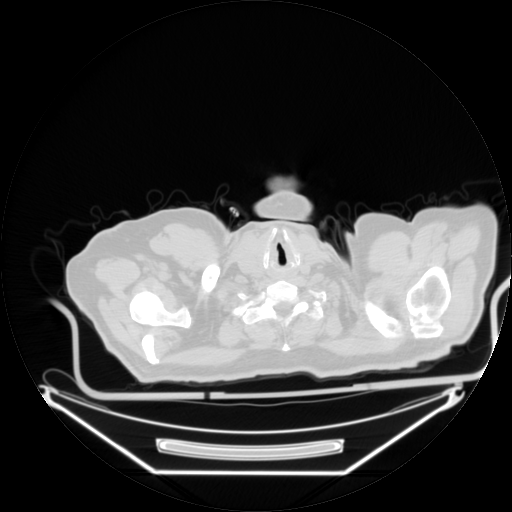
[im 264/264  brain]
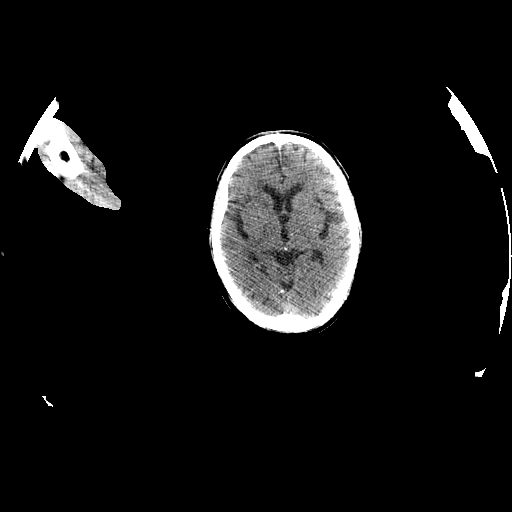

[Series 123: mip · coronal · 3.3mm · 4.69mm/px · 1 of 30 slices shown]
[im 1/30]
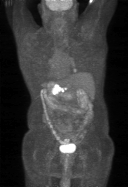

[Series 151: reformatted · axial · 3.3mm · 3.91mm/px · z∈[-877,-7]mm · 6 of 265 slices shown (1 of 2)]
[im 1/265]
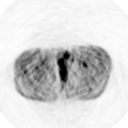
[im 53/265]
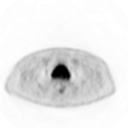
[im 106/265]
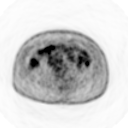
[im 159/265]
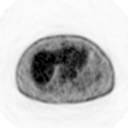
[im 212/265]
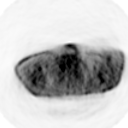
[im 265/265]
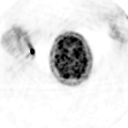

[Series 153: reformatted · coronal · 4.7mm · 6.98mm/px · 1 of 66 slices shown (2 of 2)]
[im 1/66]
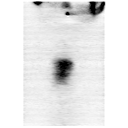

[25 of 25 positions shown; findings below may reference images not displayed]

FINDINGS: Neck:No hypermetabolic nodes in the neck.  There is focal uptake in
the pituitary gland which is atypical.

Chest:There is  hypermetabolic nodule in the right lower lobe
measuring 13 mm (image 102)  with SUV max = 4.4.

Abdomen / Pelvis:There is intense hypermetabolic active associated
with the medial/superior wall of the gastric body with SUV max =
31.7.   There are enlarged intensely hypermetabolic lymph nodes
within the gastrohepatic ligament.  For example 17 mm node (image
130) with SUV max = 26.6.

No abnormal hypermetabolic activity within the liver.  There is
diffuse uptake within the colon which is felt to be physiologic.
No evidence of adenopathy within the pelvis.

Skeleton:No focal hypermetabolic activity to suggest skeletal
metastasis.
IMPRESSION: 1.  Intensely hypermetabolic medial wall of the gastric body is
concerning for gastric carcinoma or lymphoma.
2.  Intense hypermetabolic gastrohepatic ligament lymph nodes are
consistent with  local nodal metastasis.
3.  Hypermetabolic right lower lobe pulmonary nodule is concerning
for pulmonary metastasis.
4.  Hypermetabolic pituitary gland is atypical.  Recommend MRI of
the brain with contrast to evaluate for potential pituitary
metastasis.

## 2012-12-05 ENCOUNTER — Ambulatory Visit (HOSPITAL_COMMUNITY)
Admission: RE | Admit: 2012-12-05 | Discharge: 2012-12-05 | Disposition: A | Discharge status: Home / Self Care | Payer: Medicare PPO | Source: Ambulatory Visit | Attending: Oncology | Admitting: Oncology

## 2012-12-05 DIAGNOSIS — C61 Malignant neoplasm of prostate: Secondary | ICD-10-CM | POA: Insufficient documentation

## 2012-12-05 DIAGNOSIS — R059 Cough, unspecified: Secondary | ICD-10-CM | POA: Insufficient documentation

## 2012-12-05 DIAGNOSIS — R918 Other nonspecific abnormal finding of lung field: Secondary | ICD-10-CM | POA: Insufficient documentation

## 2012-12-05 DIAGNOSIS — R05 Cough: Secondary | ICD-10-CM | POA: Insufficient documentation

## 2012-12-05 DIAGNOSIS — R911 Solitary pulmonary nodule: Secondary | ICD-10-CM

## 2012-12-05 DIAGNOSIS — D1779 Benign lipomatous neoplasm of other sites: Secondary | ICD-10-CM | POA: Insufficient documentation

## 2012-12-07 ENCOUNTER — Telehealth: Payer: Self-pay | Admitting: *Deleted

## 2012-12-07 NOTE — Telephone Encounter (Signed)
Left message on home # and cell # to call office regarding scan.

## 2012-12-07 NOTE — Telephone Encounter (Signed)
Message copied by Gala Romney on Wed Dec 07, 2012  3:45 PM ------      Message from: Ladene Artist      Created: Tue Dec 06, 2012  9:31 PM       Please call patient, increased size of small rt. Lung nodule, ok to f/u 3/17 as scheduled to discuss further ------

## 2012-12-08 ENCOUNTER — Encounter: Payer: Self-pay | Admitting: Family Medicine

## 2012-12-08 ENCOUNTER — Telehealth: Payer: Self-pay | Admitting: *Deleted

## 2012-12-08 NOTE — Telephone Encounter (Signed)
Message copied by Gala Romney on Thu Dec 08, 2012 10:56 AM ------      Message from: Ladene Artist      Created: Tue Dec 06, 2012  9:31 PM       Please call patient, increased size of small rt. Lung nodule, ok to f/u 3/17 as scheduled to discuss further ------

## 2012-12-08 NOTE — Telephone Encounter (Signed)
Spoke with pt's wife Vikki Ports; per Dr. Truett Perna informed of increase in size of rt. Lung nodule and will follow-up on next appt 12/26/12.  Pt's wife verbalized understanding.

## 2012-12-26 ENCOUNTER — Encounter (HOSPITAL_COMMUNITY): Payer: Self-pay

## 2012-12-26 ENCOUNTER — Telehealth: Payer: Self-pay | Admitting: Oncology

## 2012-12-26 ENCOUNTER — Ambulatory Visit (HOSPITAL_COMMUNITY): Admit: 2012-12-26 | Payer: Self-pay | Admitting: Gastroenterology

## 2012-12-26 ENCOUNTER — Ambulatory Visit (HOSPITAL_BASED_OUTPATIENT_CLINIC_OR_DEPARTMENT_OTHER): Payer: Medicare PPO | Admitting: Oncology

## 2012-12-26 ENCOUNTER — Other Ambulatory Visit: Payer: Medicare PPO | Admitting: Lab

## 2012-12-26 VITALS — BP 146/87 | HR 61 | Temp 96.8°F | Resp 18 | Ht 74.0 in | Wt 205.8 lb

## 2012-12-26 DIAGNOSIS — C8583 Other specified types of non-Hodgkin lymphoma, intra-abdominal lymph nodes: Secondary | ICD-10-CM

## 2012-12-26 DIAGNOSIS — C8330 Diffuse large B-cell lymphoma, unspecified site: Secondary | ICD-10-CM

## 2012-12-26 DIAGNOSIS — R918 Other nonspecific abnormal finding of lung field: Secondary | ICD-10-CM

## 2012-12-26 SURGERY — EGD (ESOPHAGOGASTRODUODENOSCOPY)
Anesthesia: Moderate Sedation

## 2012-12-26 NOTE — Telephone Encounter (Signed)
Gave pt appt for ML appt on 4/3, pt will be called by Radiology for CT biopsy, gave pt telephone number to Radiology scheduling

## 2012-12-26 NOTE — Telephone Encounter (Signed)
Gave pt appt for ML on 01/12/13

## 2012-12-26 NOTE — Progress Notes (Signed)
[facility]    OFFICE PROGRESS NOTE   INTERVAL HISTORY:   He returns as scheduled. He reports feeling well. Good appetite. No pain. A colonoscopy on 12/07/2012 revealed a 5 mm rectosigmoid polyp that was removed.  Objective:  Vital signs in last 24 hours:  Blood pressure 146/87, pulse 61, temperature 96.8 F (36 C), temperature source Oral, resp. rate 18, height 6\' 2"  (1.88 m), weight 205 lb 12.8 oz (93.35 kg).    HEENT: Neck without mass Lymphatics: No cervical, supra-clavicular, axillary, or inguinal nodes. Resp: Lungs clear bilaterally Cardio: Regular rate and rhythm GI: No hepatosplenomegaly, nontender, no mass Vascular: No leg edema Neuro: Alert and oriented    Lab Results:  Lab Results  Component Value Date   WBC 5.3 04/20/2012   HGB 13.1 04/20/2012   HCT 37.1* 04/20/2012   MCV 90.7 04/20/2012   PLT 230 04/20/2012   X-rays: CT of the chest on 12/05/2012 (compared to 12/07/2011). A nodule at the posterior right lower lobe has increased in size, now measuring 1.6 cm. There is a new ill-defined opacity at the anterior left upper lobe measuring 19 mm. No lymphadenopathy or pleural effusion.   Medications: I have reviewed the patient's current medications.  Assessment/Plan: 1. 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 Staging PET scan was consistent with hypermetabolic lymphoma involving the stomach and perigastric lymph nodes. Bone marrow biopsy on 11/24/2010 was negative for evidence of involvement with lymphoma. He began treatment with CHOP/Rituxan on 12/04/2010. Restaging PET scan 03/03/2011 revealed resolution of hypermetabolic activity at perigastric lymph nodes and decrease in the hypermetabolic activity at the stomach. He completed the 6th and final cycle of systemic therapy on 03/27/2011. An upper endoscopy on 01/01/2011 and 08/19/2011 by Dr. Loreta Ave revealed no remaining tumor. CT scans of February 2013  showed no evidence of lymphoma. 2. Hospitalization 03/12 through 12/25/2010 for a gastrointestinal bleed, presumably secondary to lymphoma, resolved. 3. Anemia secondary to gastrointestinal bleeding and chemotherapy-resolved. 4. Hypermetabolic lung nodule, question primary lung cancer versus involvement of the lung with non-Hodgkin's lymphoma. The nodule was smaller and less hypermetabolic on the PET scan 03/05/2011 suggesting a benign process or lymphoma. The nodule was again smaller on the CT 06/11/2011. The restaging CT 12/07/2011 revealed a stable right lower lobe nodule. CT 12/05/2012 reveals enlargement of the right lower lobe nodule and a new lesion at the left upper lung (? Inflammatory) 5. History of abdominal pain, likely secondary to the gastric mass and surrounding lymphadenopathy. 6. Increased FDG activity of the pituitary gland on a PET scan 11/20/2010 and an MRI on 12/03/2010 with 2 hypermetabolic subcentimeter hypoenhancing lesions within the sella turcica that appeared to be distinct. The radiologist felt the differential diagnosis included a pituitary microadenoma, lymphomatous involvement of the pituitary, or combination of the two. The hypermetabolic activity of the pituitary persisted on the PET scan 03/05/2011. He was evaluated by Dr. Wynetta Emery and a repeat MRI of the brain revealed no significant change. Dr. Wynetta Emery feels the lesions are likely benign and recommends an observation approach with serial scans. The lesions were unchanged on an MRI of the brain 07/26/2012 7. Diabetes. 8. Hypertension. 9. History of prostate cancer. 10. History of arthralgias secondary to Neulasta, relieved with Tylenol. 11. History of left leg edema, status post negative Doppler 02/13/2011. 12. Benign essential tremor. He takes propranolol. 13. Status post upper endoscopy 08/19/2011-there were no ulcers, erosions, masses or polyps noted. There was a small hiatal hernia. 14. Progressive  memory  deficit.  Disposition:  He remains in clinical remission from non-Hodgkin's lymphoma. The chest CT on 12/05/2012 reveals an enlarging, solid-appearing, right lung nodule. This may represent a primary lung cancer. It would be unusual for non-Hodgkin's lymphoma to present in this fashion. I reviewed the CT images with Mr. Craney and his wife. He will be referred for a CT-guided biopsy of the right lung nodule. We will see him after the biopsy to decide on the indication for a staging PET scan and surgical referral. The left upper lobe lesion appears to most likely be inflammatory. This can be further characterized on a staging PET scan.   Thornton Papas, MD  12/26/2012  10:24 AM

## 2012-12-27 ENCOUNTER — Encounter (HOSPITAL_COMMUNITY): Payer: Self-pay | Admitting: Pharmacy Technician

## 2012-12-28 ENCOUNTER — Ambulatory Visit (INDEPENDENT_AMBULATORY_CARE_PROVIDER_SITE_OTHER): Payer: Medicare PPO | Admitting: Family Medicine

## 2012-12-28 ENCOUNTER — Encounter: Payer: Self-pay | Admitting: Family Medicine

## 2012-12-28 VITALS — BP 130/75 | HR 77 | Temp 99.3°F | Ht 74.0 in | Wt 203.0 lb

## 2012-12-28 DIAGNOSIS — E119 Type 2 diabetes mellitus without complications: Secondary | ICD-10-CM

## 2012-12-28 DIAGNOSIS — E1159 Type 2 diabetes mellitus with other circulatory complications: Secondary | ICD-10-CM

## 2012-12-28 DIAGNOSIS — I10 Essential (primary) hypertension: Secondary | ICD-10-CM

## 2012-12-28 LAB — CBC
Hemoglobin: 12.3 g/dL — ABNORMAL LOW (ref 13.0–17.0)
MCH: 31.1 pg (ref 26.0–34.0)
MCHC: 34.4 g/dL (ref 30.0–36.0)
MCV: 90.6 fL (ref 78.0–100.0)

## 2012-12-28 LAB — POCT GLYCOSYLATED HEMOGLOBIN (HGB A1C): Hemoglobin A1C: 7.1

## 2012-12-28 NOTE — Patient Instructions (Addendum)
Stop your aspirin after tomorrow (Thursday) dose.  OK to start 3 days after the procedure.   Try over the counter claritin or Zyrtec for your runny nose.  Stay on all your other meds.  I will call with lab results.

## 2012-12-29 ENCOUNTER — Ambulatory Visit (HOSPITAL_COMMUNITY): Payer: Medicare PPO

## 2012-12-29 LAB — COMPLETE METABOLIC PANEL WITH GFR
ALT: 25 U/L (ref 0–53)
AST: 31 U/L (ref 0–37)
CO2: 25 mEq/L (ref 19–32)
Calcium: 9.3 mg/dL (ref 8.4–10.5)
Chloride: 100 mEq/L (ref 96–112)
GFR, Est African American: 73 mL/min
Sodium: 138 mEq/L (ref 135–145)
Total Bilirubin: 0.3 mg/dL (ref 0.3–1.2)
Total Protein: 7.2 g/dL (ref 6.0–8.3)

## 2012-12-30 ENCOUNTER — Encounter: Payer: Self-pay | Admitting: Family Medicine

## 2012-12-30 NOTE — Progress Notes (Signed)
  Subjective:    Patient ID: Nicholas Olsen, male    DOB: Feb 24, 1934, 79 y.o.   MRN: [id number]  HPI Doing much better from memory standpoint on aricept and namenda Needs DM check Needs anemia check. Feeling better now than in some time Only worry is recent CT scan by heme/onc shows concerning pulm nodule.  Set up for biopsy.    Review of Systems     Objective:   Physical Exam Affect good.  Oriented x 3 Lungs clear Cardiac RRR without m or g Abd benign       Assessment & Plan:

## 2012-12-30 NOTE — Assessment & Plan Note (Signed)
Good control

## 2012-12-30 NOTE — Assessment & Plan Note (Signed)
Decent control

## 2013-01-03 ENCOUNTER — Other Ambulatory Visit: Payer: Self-pay | Admitting: Radiology

## 2013-01-05 ENCOUNTER — Ambulatory Visit (HOSPITAL_COMMUNITY)
Admission: RE | Admit: 2013-01-05 | Discharge: 2013-01-05 | Disposition: A | Discharge status: Home / Self Care | Payer: Medicare PPO | Source: Ambulatory Visit | Attending: Oncology | Admitting: Oncology

## 2013-01-05 ENCOUNTER — Encounter (HOSPITAL_COMMUNITY): Payer: Self-pay

## 2013-01-05 DIAGNOSIS — E119 Type 2 diabetes mellitus without complications: Secondary | ICD-10-CM | POA: Insufficient documentation

## 2013-01-05 DIAGNOSIS — C8330 Diffuse large B-cell lymphoma, unspecified site: Secondary | ICD-10-CM

## 2013-01-05 DIAGNOSIS — R911 Solitary pulmonary nodule: Secondary | ICD-10-CM | POA: Insufficient documentation

## 2013-01-05 DIAGNOSIS — C8589 Other specified types of non-Hodgkin lymphoma, extranodal and solid organ sites: Secondary | ICD-10-CM | POA: Insufficient documentation

## 2013-01-05 DIAGNOSIS — C61 Malignant neoplasm of prostate: Secondary | ICD-10-CM | POA: Insufficient documentation

## 2013-01-05 DIAGNOSIS — E0590 Thyrotoxicosis, unspecified without thyrotoxic crisis or storm: Secondary | ICD-10-CM | POA: Insufficient documentation

## 2013-01-05 LAB — CBC
HCT: 33.9 % — ABNORMAL LOW (ref 39.0–52.0)
MCH: 31.2 pg (ref 26.0–34.0)
MCV: 91.1 fL (ref 78.0–100.0)
Platelets: 203 10*3/uL (ref 150–400)
RBC: 3.72 MIL/uL — ABNORMAL LOW (ref 4.22–5.81)
RDW: 14.3 % (ref 11.5–15.5)

## 2013-01-05 MED ORDER — HYDROCODONE-ACETAMINOPHEN 5-325 MG PO TABS: 1.0000 | ORAL_TABLET | ORAL | Status: DC | PRN

## 2013-01-05 MED ORDER — FENTANYL CITRATE 0.05 MG/ML IJ SOLN
INTRAMUSCULAR | Status: AC | PRN
  Administered 2013-01-05 (×2): 50 ug via INTRAVENOUS

## 2013-01-05 MED ORDER — MIDAZOLAM HCL 2 MG/2ML IJ SOLN
INTRAMUSCULAR | Status: AC
  Filled 2013-01-05: qty 6

## 2013-01-05 MED ORDER — SODIUM CHLORIDE 0.9 % IV SOLN: INTRAVENOUS | Status: DC

## 2013-01-05 MED ORDER — MIDAZOLAM HCL 2 MG/2ML IJ SOLN
INTRAMUSCULAR | Status: AC | PRN
  Administered 2013-01-05 (×2): 1 mg via INTRAVENOUS

## 2013-01-05 MED ORDER — FENTANYL CITRATE 0.05 MG/ML IJ SOLN
INTRAMUSCULAR | Status: AC
  Filled 2013-01-05: qty 6

## 2013-01-05 NOTE — H&P (Signed)
Agree 

## 2013-01-05 NOTE — Procedures (Signed)
Procedure:  CT guided core biopsy of right lower lobe lung nodule Findings:  RLL lung nodule sampled via 17 G nodule with 18 G core device x 2 passes.

## 2013-01-05 NOTE — H&P (Signed)
Nicholas Olsen is an 79 y.o. male.   Chief Complaint: "I'm having a lung biopsy" HPI: Patient with history of NHL, prostate cancer and enlarging right lower lobe lung nodule presents today for CT guided RLL lung nodule biopsy.   Past Medical History  Diagnosis Date  . Cancer   . Diabetes mellitus   . Diffuse large B cell lymphoma 2012    gastric found on EGD  . Hyperthyroidism     Thyroid radiation  . Radiation 1. 7/00  2. 9/00    1. Finished external beam radiation to prostate  2. Gold seeds in prostate  . History of ETT 02/24/06    OK  . Carotid artery occlusion 2/07    Carotid ultrasound: Rt nl: Lt<50% stenosis    Past Surgical History  Procedure Laterality Date  . Tests      08/04/06 Idl: 80 direct-08/20/06, ABI: 0.85 mild PAD-02/05/05, barium enema-cardiac cath 1/00 normal-cardiolyte no ischemia, EF=43%-10/26/00, CT-head-CXR-ECG-echo EF=55-65%-02/16/05, thyroid scan-US-abdominal-xray-spine    Family History  Problem Relation Age of Onset  . Coronary artery disease Other    Social History:  reports that he quit smoking about 12 years ago. He has never used smokeless tobacco. He reports that he does not drink alcohol or use illicit drugs.  Allergies:  Allergies  Allergen Reactions  . Amoxicillin     REACTION: unspecified  . Erythromycin Ethylsuccinate     REACTION: unspecified  . Ezetimibe     REACTION: muscle pain  . Simvastatin     REACTION: tried on multiple statins and had muscle pain  . Sulfamethoxazole     REACTION: unspecified  . Tetracycline Hcl     REACTION: unspecified    Current outpatient prescriptions:amLODipine (NORVASC) 10 MG tablet, Take 10 mg by mouth every morning. , Disp: , Rfl: ;  aspirin 81 MG EC tablet, Take 81 mg by mouth every morning. , Disp: , Rfl: ;  B Complex-C (SUPER B COMPLEX PO), Take 1 tablet by mouth daily., Disp: , Rfl: ;  donepezil (ARICEPT) 10 MG tablet, Take 10 mg by mouth at bedtime., Disp: , Rfl:  ferrous sulfate 325 (65 FE) MG  tablet, Take 325 mg by mouth every 7 (seven) days. Pt does not take all the time, Disp: , Rfl: ;  hydrochlorothiazide (HYDRODIURIL) 25 MG tablet, Take 12.5 mg by mouth every morning., Disp: , Rfl: ;  levothyroxine (SYNTHROID, LEVOTHROID) 50 MCG tablet, Take 50 mcg by mouth daily before breakfast. , Disp: , Rfl: ;  losartan (COZAAR) 100 MG tablet, Take 100 mg by mouth every morning. , Disp: , Rfl:  memantine (NAMENDA) 10 MG tablet, Take 10 mg by mouth 2 (two) times daily., Disp: , Rfl: ;  metFORMIN (GLUCOPHAGE) 1000 MG tablet, Take 1,000 mg by mouth 2 (two) times daily. , Disp: , Rfl: ;  Multiple Vitamins-Minerals (VITAMINS/MINERALS PO), Take 1 tablet by mouth daily., Disp: , Rfl: ;  omeprazole (PRILOSEC) 40 MG capsule, Take 40 mg by mouth daily., Disp: , Rfl:  pravastatin (PRAVACHOL) 40 MG tablet, Take 1 tablet (40 mg total) by mouth every evening., Disp: 30 tablet, Rfl: 11;  propranolol (INDERAL) 20 MG tablet, Take 20 mg by mouth 2 (two) times daily., Disp: , Rfl: ;  [DISCONTINUED] primidone (MYSOLINE) 50 MG tablet, Take 50 mg by mouth at bedtime. , Disp: , Rfl:  Current facility-administered medications:0.9 %  sodium chloride infusion, , Intravenous, Continuous, D Kevin Allred, PA-C Facility-Administered Medications Ordered in Other Encounters: fentaNYL (SUBLIMAZE) 0.05 MG/ML injection, , , , ;  midazolam (VERSED) 2 MG/2ML injection, , , ,    Results for orders placed during the hospital encounter of 01/05/13 (from the past 48 hour(s))  CBC     Status: Abnormal   Collection Time    01/05/13  7:40 AM      Result Value Range   WBC 5.3  4.0 - 10.5 K/uL   RBC 3.72 (*) 4.22 - 5.81 MIL/uL   Hemoglobin 11.6 (*) 13.0 - 17.0 g/dL   HCT 96.0 (*) 45.4 - 09.8 %   MCV 91.1  78.0 - 100.0 fL   MCH 31.2  26.0 - 34.0 pg   MCHC 34.2  30.0 - 36.0 g/dL   RDW 11.9  14.7 - 82.9 %   Platelets 203  150 - 400 K/uL   PT 12.5  INR 0.94  PTT 28 Review of Systems  Constitutional: Negative for fever and chills.   Respiratory: Positive for cough. Negative for hemoptysis and shortness of breath.   Cardiovascular: Negative for chest pain.  Gastrointestinal: Negative for nausea, vomiting and abdominal pain.  Musculoskeletal: Negative for back pain.  Neurological: Negative for headaches.  Endo/Heme/Allergies: Does not bruise/bleed easily.   Vitals: BP 139/85  HR 69  R 18  TEMP 98.1   O2 SATS 97%RA Physical Exam  Constitutional: He is oriented to person, place, and time. He appears well-developed and well-nourished.  Cardiovascular: Normal rate and regular rhythm.   Respiratory: Effort normal and breath sounds normal.  GI: Soft. Bowel sounds are normal. There is no tenderness.  Musculoskeletal: Normal range of motion. He exhibits no edema.  Neurological: He is alert and oriented to person, place, and time.     Assessment/Plan Pt with hx NHL, prostate cancer and enlarging RLL lung nodule. Plan is for CT guided RLL lung nodule biopsy today. Details/risks of procedure d/w pt/wife with their understanding and consent.  ALLRED,D KEVIN 01/05/2013, 7:55 AM

## 2013-01-06 ENCOUNTER — Telehealth: Payer: Self-pay | Admitting: Dietician

## 2013-01-06 NOTE — Telephone Encounter (Signed)
Brief Outpatient Oncology Nutrition Note  Patient has been identified to be at risk on malnutrition screen.   Wt Readings from Last 10 Encounters:  01/05/13 195 lb (88.451 kg)  12/28/12 203 lb (92.08 kg)  12/26/12 205 lb 12.8 oz (93.35 kg)  09/28/12 208 lb (94.348 kg)  08/26/12 205 lb 14.4 oz (93.396 kg)  08/10/12 208 lb 8 oz (94.575 kg)  06/29/12 206 lb (93.441 kg)  06/23/12 205 lb (92.987 kg)  05/25/12 209 lb 8 oz (95.029 kg)  04/25/12 211 lb 11.2 oz (96.026 kg)    Called and spoke to patient's wife.  Wife was in a meeting.  Contact info provided for any nutrition questions.    Oran Rein, RD, LDN

## 2013-01-12 ENCOUNTER — Ambulatory Visit (HOSPITAL_BASED_OUTPATIENT_CLINIC_OR_DEPARTMENT_OTHER): Payer: Medicare PPO | Admitting: Oncology

## 2013-01-12 ENCOUNTER — Telehealth: Payer: Self-pay | Admitting: Oncology

## 2013-01-12 VITALS — BP 139/78 | HR 60 | Temp 97.0°F | Resp 20 | Ht 73.0 in | Wt 204.4 lb

## 2013-01-12 DIAGNOSIS — C8589 Other specified types of non-Hodgkin lymphoma, extranodal and solid organ sites: Secondary | ICD-10-CM

## 2013-01-12 DIAGNOSIS — C8330 Diffuse large B-cell lymphoma, unspecified site: Secondary | ICD-10-CM

## 2013-01-12 NOTE — Telephone Encounter (Signed)
, °

## 2013-01-12 NOTE — Progress Notes (Signed)
[facility]    OFFICE PROGRESS NOTE   INTERVAL HISTORY:   He returns as scheduled. He underwent a CT-guided biopsy of the right lung lesion on 01/05/2013. The pathology confirmed involvement by a B-cell non-Hodgkin's lymphoma.  Mr. Laurel denies fever, night sweats, anorexia, and palpable lymph nodes. He feels well.  Objective:  Vital signs in last 24 hours:  Blood pressure 139/78, pulse 60, temperature 97 F (36.1 C), temperature source Oral, resp. rate 20, height 6\' 1"  (1.854 m), weight 204 lb 6.4 oz (92.715 kg).    HEENT: Neck without mass Lymphatics: No cervical, supraclavicular, axillary, or inguinal nodes Resp: Lungs clear bilaterally Cardio: Regular rate and rhythm GI: No hepatosplenomegaly, nontender Vascular: No leg edema Neuro: Alert, oriented to month and year      Lab Results:  Lab Results  Component Value Date   WBC 5.3 01/05/2013   HGB 11.6* 01/05/2013   HCT 33.9* 01/05/2013   MCV 91.1 01/05/2013   PLT 203 01/05/2013      Medications: I have reviewed the patient's current medications.  Assessment/Plan: 1. 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 Staging PET scan was consistent with hypermetabolic lymphoma involving the stomach and perigastric lymph nodes. Bone marrow biopsy on 11/24/2010 was negative for evidence of involvement with lymphoma. He began treatment with CHOP/Rituxan on 12/04/2010. Restaging PET scan 03/03/2011 revealed resolution of hypermetabolic activity at perigastric lymph nodes and decrease in the hypermetabolic activity at the stomach. He completed the 6th and final cycle of systemic therapy on 03/27/2011. An upper endoscopy on 01/01/2011 and 08/19/2011 by Dr. Loreta Ave revealed no remaining tumor. CT scans of February 2013 showed no evidence of lymphoma. 2. Hospitalization 03/12 through 12/25/2010 for a gastrointestinal bleed, presumably secondary to lymphoma,  resolved. 3. Anemia secondary to gastrointestinal bleeding and chemotherapy-resolved. 4. Hypermetabolic lung nodule, question primary lung cancer versus involvement of the lung with non-Hodgkin's lymphoma. The nodule was smaller and less hypermetabolic on the PET scan 03/05/2011 suggesting a benign process or lymphoma. The nodule was again smaller on the CT 06/11/2011. The restaging CT 12/07/2011 revealed a stable right lower lobe nodule. CT 12/05/2012 reveals enlargement of the right lower lobe nodule and a new lesion at the left upper lung (? Inflammatory). Status post a biopsy of the right lower lobe nodule on 01/05/2013 with the bulge confirming a B-cell non-Hodgkin's lymphoma. 5. History of abdominal pain, likely secondary to the gastric mass and surrounding lymphadenopathy. 6. Increased FDG activity of the pituitary gland on a PET scan 11/20/2010 and an MRI on 12/03/2010 with 2 hypermetabolic subcentimeter hypoenhancing lesions within the sella turcica that appeared to be distinct. The radiologist felt the differential diagnosis included a pituitary microadenoma, lymphomatous involvement of the pituitary, or combination of the two. The hypermetabolic activity of the pituitary persisted on the PET scan 03/05/2011. He was evaluated by Dr. Wynetta Emery and a repeat MRI of the brain revealed no significant change. Dr. Wynetta Emery feels the lesions are likely benign and recommends an observation approach with serial scans. The lesions were unchanged on an MRI of the brain 07/26/2012 7. Diabetes. 8. Hypertension. 9. History of prostate cancer. 10. History of arthralgias secondary to Neulasta, relieved with Tylenol. 11. History of left leg edema, status post negative Doppler 02/13/2011. 12. Benign essential tremor. He takes propranolol. 13. Status post upper endoscopy 08/19/2011-there were no ulcers, erosions, masses or polyps noted. There was a small hiatal hernia. 14. Progressive memory deficit-improved with Aricept  and Namenda  Disposition:  Mr. Nicholas Olsen appears well. The lung biopsy confirms non-Hodgkin's lymphoma. He is asymptomatic from the lymphoma at present. He will complete a staging evaluation with a PET scan and LDH prior to deciding on treatment. We will consider radiation to the chest if there are no additional sites of disease. We can consider salvage systemic options if the PET scan reveals lymphoma at multiple sites. We will need to take into account his age and memory deficit when deciding on therapy.  Mr. Clevenger is due for a followup MRI of the pituitary. I recommended placing this study on hold until he undergoes the staging PET scan. It is possible the pituitary lesions are related to non-Hodgkin's lymphoma, but I think this is very unlikely given the relative stability of these lesions over the past several years.  Mr. Beier will return for an office visit after the staging PET scan.   Thornton Papas, MD  01/12/2013  2:15 PM

## 2013-01-17 ENCOUNTER — Telehealth: Payer: Self-pay | Admitting: Cardiovascular Disease

## 2013-01-17 NOTE — Telephone Encounter (Deleted)
error 

## 2013-01-18 ENCOUNTER — Ambulatory Visit (HOSPITAL_COMMUNITY)
Admission: RE | Admit: 2013-01-18 | Discharge: 2013-01-18 | Disposition: A | Discharge status: Home / Self Care | Payer: Medicare PPO | Source: Ambulatory Visit | Attending: Oncology | Admitting: Oncology

## 2013-01-18 DIAGNOSIS — R599 Enlarged lymph nodes, unspecified: Secondary | ICD-10-CM | POA: Insufficient documentation

## 2013-01-18 DIAGNOSIS — C8330 Diffuse large B-cell lymphoma, unspecified site: Secondary | ICD-10-CM

## 2013-01-18 DIAGNOSIS — R918 Other nonspecific abnormal finding of lung field: Secondary | ICD-10-CM | POA: Insufficient documentation

## 2013-01-18 DIAGNOSIS — C8589 Other specified types of non-Hodgkin lymphoma, extranodal and solid organ sites: Secondary | ICD-10-CM | POA: Insufficient documentation

## 2013-01-18 LAB — GLUCOSE, CAPILLARY: Glucose-Capillary: 133 mg/dL — ABNORMAL HIGH (ref 70–99)

## 2013-01-18 MED ORDER — FLUDEOXYGLUCOSE F - 18 (FDG) INJECTION
19.1000 | Freq: Once | INTRAVENOUS | Status: AC | PRN
  Administered 2013-01-18: 19.1 via INTRAVENOUS

## 2013-01-30 ENCOUNTER — Telehealth: Payer: Self-pay | Admitting: Oncology

## 2013-01-30 ENCOUNTER — Ambulatory Visit (HOSPITAL_BASED_OUTPATIENT_CLINIC_OR_DEPARTMENT_OTHER): Payer: Medicare PPO | Admitting: Oncology

## 2013-01-30 ENCOUNTER — Other Ambulatory Visit (HOSPITAL_BASED_OUTPATIENT_CLINIC_OR_DEPARTMENT_OTHER): Payer: Medicare PPO | Admitting: Lab

## 2013-01-30 ENCOUNTER — Encounter: Payer: Self-pay | Admitting: Lab

## 2013-01-30 VITALS — BP 142/85 | HR 60 | Temp 97.1°F | Resp 18 | Ht 73.0 in | Wt 205.4 lb

## 2013-01-30 DIAGNOSIS — C8583 Other specified types of non-Hodgkin lymphoma, intra-abdominal lymph nodes: Secondary | ICD-10-CM

## 2013-01-30 DIAGNOSIS — E237 Disorder of pituitary gland, unspecified: Secondary | ICD-10-CM

## 2013-01-30 DIAGNOSIS — C8330 Diffuse large B-cell lymphoma, unspecified site: Secondary | ICD-10-CM

## 2013-01-30 LAB — CBC WITH DIFFERENTIAL/PLATELET
BASO%: 1.1 % (ref 0.0–2.0)
Basophils Absolute: 0.1 10*3/uL (ref 0.0–0.1)
HCT: 36.6 % — ABNORMAL LOW (ref 38.4–49.9)
HGB: 12.2 g/dL — ABNORMAL LOW (ref 13.0–17.1)
LYMPH%: 32 % (ref 14.0–49.0)
MCHC: 33.3 g/dL (ref 32.0–36.0)
MONO#: 0.5 10*3/uL (ref 0.1–0.9)
NEUT%: 54.1 % (ref 39.0–75.0)
Platelets: 137 10*3/uL — ABNORMAL LOW (ref 140–400)
WBC: 5.2 10*3/uL (ref 4.0–10.3)
lymph#: 1.7 10*3/uL (ref 0.9–3.3)

## 2013-01-30 NOTE — Progress Notes (Signed)
[facility]    OFFICE PROGRESS NOTE   INTERVAL HISTORY:   Nicholas Olsen returns as scheduled. He feels well. No specific complaint.  Objective:  Vital signs in last 24 hours:  Blood pressure 142/85, pulse 60, temperature 97.1 F (36.2 C), temperature source Oral, resp. rate 18, height 6\' 1"  (1.854 m), weight 205 lb 6.4 oz (93.169 kg).    HEENT: Neck without mass Lymphatics: No cervical or supraclavicular nodes Resp: Lungs clear bilaterally Cardio: Regular rate and rhythm GI: No hepatosplenomegaly Vascular: No leg edema   Lab Results:  Lab Results  Component Value Date   WBC 5.2 01/30/2013   HGB 12.2* 01/30/2013   HCT 36.6* 01/30/2013   MCV 93.8 01/30/2013   PLT 137* 01/30/2013   X-rays: PET scan 01/18/2013-mild hypermetabolic activity associated with a right low jugular node, no other hypermetabolic nodes in the neck. Hypermetabolic anterior left upper lobe nodule, hypermetabolic right lower lobe nodule. No abnormal hypermetabolic activity in the liver or spleen. No hypermetabolic lymph nodes in the abdomen or pelvis. No hypermetabolic activity in the skeleton.   Medications: I have reviewed the patient's current medications.  Assessment/Plan: 1. 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 Staging PET scan was consistent with hypermetabolic lymphoma involving the stomach and perigastric lymph nodes. Bone marrow biopsy on 11/24/2010 was negative for evidence of involvement with lymphoma. He began treatment with CHOP/Rituxan on 12/04/2010. Restaging PET scan 03/03/2011 revealed resolution of hypermetabolic activity at perigastric lymph nodes and decrease in the hypermetabolic activity at the stomach. He completed the 6th and final cycle of systemic therapy on 03/27/2011. An upper endoscopy on 01/01/2011 and 08/19/2011 by Dr. Loreta Ave revealed no remaining tumor. CT scans of February 2013 showed no evidence of  lymphoma. 2. Hospitalization 03/12 through 12/25/2010 for a gastrointestinal bleed, presumably secondary to lymphoma, resolved. 3. Anemia secondary to gastrointestinal bleeding and chemotherapy-resolved. 4. Hypermetabolic lung nodule, question primary lung cancer versus involvement of the lung with non-Hodgkin's lymphoma. The nodule was smaller and less hypermetabolic on the PET scan 03/05/2011 suggesting a benign process or lymphoma. The nodule was again smaller on the CT 06/11/2011. The restaging CT 12/07/2011 revealed a stable right lower lobe nodule. CT 12/05/2012 reveals enlargement of the right lower lobe nodule and a new lesion at the left upper lung (? Inflammatory). Status post a biopsy of the right lower lobe nodule on 01/05/2013 with the biopsy confirming a B-cell non-Hodgkin's lymphoma. -Staging PET scan 01/18/2013 revealed hypermetabolic activity involving lung nodules and a right jugular lymph node 5. History of abdominal pain, likely secondary to the gastric mass and surrounding lymphadenopathy. 6. Increased FDG activity of the pituitary gland on a PET scan 11/20/2010 and an MRI on 12/03/2010 with 2 hypermetabolic subcentimeter hypoenhancing lesions within the sella turcica that appeared to be distinct. The radiologist felt the differential diagnosis included a pituitary microadenoma, lymphomatous involvement of the pituitary, or combination of the two. The hypermetabolic activity of the pituitary persisted on the PET scan 03/05/2011. He was evaluated by Dr. Wynetta Emery and a repeat MRI of the brain revealed no significant change. Dr. Wynetta Emery feels the lesions are likely benign and recommends an observation approach with serial scans. The lesions were unchanged on an MRI of the brain 07/26/2012 7. Diabetes. 8. Hypertension. 9. History of prostate cancer. 10. History of arthralgias secondary to Neulasta, relieved with Tylenol. 11. History of left leg edema, status post negative Doppler  02/13/2011. 12. Benign essential tremor. He takes propranolol.  13. Status post upper endoscopy 08/19/2011-there were no ulcers, erosions, masses or polyps noted. There was a small hiatal hernia. 14. Progressive memory deficit-improved with Aricept and Namenda  Disposition:  He has current non-Hodgkin's lymphoma. Nicholas Olsen appears asymptomatic from the lymphoma and there appears to be a low tumor burden at present. I discussed treatment options with Nicholas Olsen and his wife. We discussed salvage systemic chemotherapy and stem cell therapy. He does not wish to consider stem cell therapy. He understands the likelihood of other treatment being curative is very small. We discussed bendamustine/rituximab.  Nicholas Olsen  agrees to a referral to the lymphoma service at [facility] to see if he will qualify for clinical trial and to get another opinion. He will return for an office visit here in 3 weeks. It is possible the pituitary lesions are related to the non-Hodgkin's lymphoma, but this would be unusual. We will defer a repeat brain MRI until he is evaluated at [facility].   Thornton Papas, MD  01/30/2013  3:33 PM

## 2013-01-31 ENCOUNTER — Encounter: Payer: Self-pay | Admitting: Cardiovascular Disease

## 2013-01-31 ENCOUNTER — Ambulatory Visit (INDEPENDENT_AMBULATORY_CARE_PROVIDER_SITE_OTHER): Payer: Medicare PPO | Admitting: Cardiovascular Disease

## 2013-01-31 VITALS — BP 143/88 | HR 63 | Ht 72.0 in | Wt 204.0 lb

## 2013-01-31 DIAGNOSIS — I10 Essential (primary) hypertension: Secondary | ICD-10-CM

## 2013-01-31 DIAGNOSIS — I471 Supraventricular tachycardia, unspecified: Secondary | ICD-10-CM

## 2013-01-31 NOTE — Assessment & Plan Note (Signed)
BP is reasonably controlled on current medications.

## 2013-01-31 NOTE — Patient Instructions (Addendum)
Your physician wants you to follow-up in: 1 year with Dr. Arida. You will receive a reminder letter in the mail two months in advance. If you don't receive a letter, please call our office to schedule the follow-up appointment.  Your physician recommends that you continue on your current medications as directed. Please refer to the Current Medication list given to you today.  

## 2013-01-31 NOTE — Assessment & Plan Note (Signed)
No recurrent episodes in many years. Continue treatment with Propranolol.  Most recent echo in 2011 showed normal EF.

## 2013-01-31 NOTE — Progress Notes (Signed)
HPI  Nicholas Olsen is a 79 year old gentleman who is here today for a followup visit. He has the following problem list. He was last seen in 2011. He has a remote history of supraventricular tachycardia, no coronary artery disease per cardiac catheterization in June 2010, at [facility]. He has other chronic medial conditions including hypertension, Hyperlipidemia, type 2 diabetes and lymphoma.  He has been stable from a cardiac standpoint. Most recent echo in 2011 showed normal LVSF. No chest pain , dyspnea or palpitations.  He will be going to [facility] for a second opinion regarding treatment of lymphoma.   Allergies  Allergen Reactions  . Amoxicillin     REACTION: unspecified  . Erythromycin Ethylsuccinate     REACTION: unspecified  . Ezetimibe     REACTION: muscle pain  . Simvastatin     REACTION: tried on multiple statins and had muscle pain  . Sulfamethoxazole     REACTION: unspecified  . Tetracycline Hcl     REACTION: unspecified     Current Outpatient Prescriptions on File Prior to Visit  Medication Sig Dispense Refill  . amLODipine (NORVASC) 10 MG tablet Take 10 mg by mouth every morning.       Marland Kitchen aspirin 81 MG EC tablet Take 81 mg by mouth every morning.       . B Complex-C (SUPER B COMPLEX PO) Take 1 tablet by mouth daily.      . cetirizine (ZYRTEC ALLERGY) 10 MG tablet Take 10 mg by mouth daily. Take one tab as needed      . donepezil (ARICEPT) 10 MG tablet Take 10 mg by mouth at bedtime.      . ferrous sulfate 325 (65 FE) MG tablet Take 325 mg by mouth daily. Pt does not take all the time      . hydrochlorothiazide (HYDRODIURIL) 25 MG tablet Take 12.5 mg by mouth every morning.      Marland Kitchen levothyroxine (SYNTHROID, LEVOTHROID) 50 MCG tablet Take 50 mcg by mouth daily before breakfast.       . losartan (COZAAR) 100 MG tablet Take 100 mg by mouth every morning.       . memantine (NAMENDA) 10 MG tablet Take 10 mg by mouth 2 (two) times daily.      . metFORMIN  (GLUCOPHAGE) 1000 MG tablet Take 1,000 mg by mouth 2 (two) times daily.       . Multiple Vitamins-Minerals (VITAMINS/MINERALS PO) Take 1 tablet by mouth daily.      Marland Kitchen omeprazole (PRILOSEC) 40 MG capsule Take 40 mg by mouth daily.      . pravastatin (PRAVACHOL) 40 MG tablet Take 1 tablet (40 mg total) by mouth every evening.  30 tablet  11  . propranolol (INDERAL) 20 MG tablet Take 20 mg by mouth 2 (two) times daily.      . [DISCONTINUED] primidone (MYSOLINE) 50 MG tablet Take 50 mg by mouth at bedtime.        No current facility-administered medications on file prior to visit.     Past Medical History  Diagnosis Date  . Cancer   . Diabetes mellitus   . Diffuse large B cell lymphoma 2012    gastric found on EGD  . Hyperthyroidism     Thyroid radiation  . Radiation 1. 7/00  2. 9/00    1. Finished external beam radiation to prostate  2. Gold seeds in prostate  . History of ETT 02/24/06    OK  .  Carotid artery occlusion 2/07    Carotid ultrasound: Rt nl: Lt<50% stenosis  . PSVT (paroxysmal supraventricular tachycardia)   . Hypertension      Past Surgical History  Procedure Laterality Date  . Tests      08/04/06 Idl: 80 direct-08/20/06, ABI: 0.85 mild PAD-02/05/05, barium enema-cardiac cath 1/00 normal-cardiolyte no ischemia, EF=43%-10/26/00, CT-head-CXR-ECG-echo EF=55-65%-02/16/05, thyroid scan-US-abdominal-xray-spine  . Cardiac catheterization  2010    at [facility]. No significant CAD.     Family History  Problem Relation Age of Onset  . Coronary artery disease Other      History   Social History  . Marital Status: Married    Spouse Name: N/A    Number of Children: N/A  . Years of Education: N/A   Occupational History  . [employer]     Owns business   Social History Main Topics  . Smoking status: Former Smoker -- 0.25 packs/day    Quit date: 10/12/2000  . Smokeless tobacco: Former Neurosurgeon  . Alcohol Use: No  . Drug Use: No  . Sexually Active: Not on file   Other Topics  Concern  . Not on file   Social History Narrative   Very regular exercise.     PHYSICAL EXAM   BP 143/88  Pulse 63  Ht 6' (1.829 m)  Wt 204 lb (92.534 kg)  BMI 27.66 kg/m2 Constitutional: He is oriented to person, place, and time. He appears well-developed and well-nourished. No distress.  HENT: No nasal discharge.  Head: Normocephalic and atraumatic.  Eyes: Pupils are equal and round. Right eye exhibits no discharge. Left eye exhibits no discharge.  Neck: Normal range of motion. Neck supple. No JVD present. No thyromegaly present.  Cardiovascular: Normal rate, regular rhythm, normal heart sounds and. Exam reveals no gallop and no friction rub. No murmur heard.  Pulmonary/Chest: Effort normal and breath sounds normal. No stridor. No respiratory distress. He has no wheezes. He has no rales. He exhibits no tenderness.  Abdominal: Soft. Bowel sounds are normal. He exhibits no distension. There is no tenderness. There is no rebound and no guarding.  Musculoskeletal: Normal range of motion. He exhibits no edema and no tenderness.  Neurological: He is alert and oriented to person, place, and time. Coordination normal.  Skin: Skin is warm and dry. No rash noted. He is not diaphoretic. No erythema. No pallor.  Psychiatric: He has a normal mood and affect. His behavior is normal. Judgment and thought content normal.     EKG: NSR with sinus arrhythmia    ASSESSMENT AND PLAN

## 2013-02-01 ENCOUNTER — Telehealth: Payer: Self-pay | Admitting: Oncology

## 2013-02-01 NOTE — Telephone Encounter (Signed)
Pt appt. With Dr. Gerlene Burdock @ [facility] is 02/07/13@12 :00. Faxed medical records, slides and scans will be fedex'ed. Pt is aware

## 2013-02-08 DIAGNOSIS — I10 Essential (primary) hypertension: Secondary | ICD-10-CM | POA: Insufficient documentation

## 2013-02-08 DIAGNOSIS — R413 Other amnesia: Secondary | ICD-10-CM | POA: Insufficient documentation

## 2013-02-08 DIAGNOSIS — E119 Type 2 diabetes mellitus without complications: Secondary | ICD-10-CM | POA: Insufficient documentation

## 2013-02-08 DIAGNOSIS — G250 Essential tremor: Secondary | ICD-10-CM | POA: Insufficient documentation

## 2013-02-10 ENCOUNTER — Telehealth: Payer: Self-pay | Admitting: Oncology

## 2013-02-10 NOTE — Telephone Encounter (Signed)
pt wife called to r/s appt due to having another appt.Marland KitchenMarland KitchenDone

## 2013-02-20 ENCOUNTER — Ambulatory Visit: Payer: Medicare PPO | Admitting: Nurse Practitioner

## 2013-02-20 ENCOUNTER — Other Ambulatory Visit: Payer: Medicare PPO | Admitting: Lab

## 2013-02-22 ENCOUNTER — Other Ambulatory Visit: Payer: Self-pay | Admitting: *Deleted

## 2013-02-23 ENCOUNTER — Ambulatory Visit (HOSPITAL_BASED_OUTPATIENT_CLINIC_OR_DEPARTMENT_OTHER): Payer: Medicare PPO | Admitting: Nurse Practitioner

## 2013-02-23 ENCOUNTER — Other Ambulatory Visit: Payer: Medicare PPO | Admitting: Lab

## 2013-02-23 ENCOUNTER — Telehealth: Payer: Self-pay | Admitting: Oncology

## 2013-02-23 VITALS — BP 128/76 | HR 58 | Temp 97.8°F | Resp 18 | Ht 72.0 in | Wt 200.2 lb

## 2013-02-23 DIAGNOSIS — C8330 Diffuse large B-cell lymphoma, unspecified site: Secondary | ICD-10-CM

## 2013-02-23 DIAGNOSIS — C8589 Other specified types of non-Hodgkin lymphoma, extranodal and solid organ sites: Secondary | ICD-10-CM

## 2013-02-23 NOTE — Progress Notes (Signed)
OFFICE PROGRESS NOTE  Interval history:  Nicholas Olsen returns as scheduled. He feels well. No fevers or sweats. He has a good appetite. His wife reports he has lost about 4 pounds. She attributes the weight loss to an increased level of activity. He denies pain. No shortness of breath or cough.   Objective: Blood pressure 128/76, pulse 58, temperature 97.8 F (36.6 C), temperature source Oral, resp. rate 18, height 6' (1.829 m), weight 200 lb 3.2 oz (90.81 kg).  Oropharynx is without thrush or ulceration. No palpable cervical, supraclavicular, axillary or inguinal lymph nodes. Lungs are clear. No wheezes or rales. Regular cardiac rhythm. Abdomen is soft and nontender. No organomegaly. Extremities are without edema.  Lab Results: Lab Results  Component Value Date   WBC 5.2 01/30/2013   HGB 12.2* 01/30/2013   HCT 36.6* 01/30/2013   MCV 93.8 01/30/2013   PLT 137* 01/30/2013    Chemistry:    Chemistry      Component Value Date/Time   NA 138 12/28/2012 1650   K 3.4* 12/28/2012 1650   CL 100 12/28/2012 1650   CO2 25 12/28/2012 1650   BUN 17 12/28/2012 1650   CREATININE 1.12 12/28/2012 1650   CREATININE 0.74 07/26/2012 1038      Component Value Date/Time   CALCIUM 9.3 12/28/2012 1650   ALKPHOS 81 12/28/2012 1650   AST 31 12/28/2012 1650   ALT 25 12/28/2012 1650   BILITOT 0.3 12/28/2012 1650       Studies/Results: No results found.  Medications: I have reviewed the patient's current medications.  Assessment/Plan:  1. 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 Staging PET scan was consistent with hypermetabolic lymphoma involving the stomach and perigastric lymph nodes. Bone marrow biopsy on 11/24/2010 was negative for evidence of involvement with lymphoma. He began treatment with CHOP/Rituxan on 12/04/2010. Restaging PET scan 03/03/2011 revealed resolution of hypermetabolic activity at perigastric lymph nodes and decrease in the hypermetabolic  activity at the stomach. He completed the 6th and final cycle of systemic therapy on 03/27/2011. An upper endoscopy on 01/01/2011 and 08/19/2011 by Dr. Loreta Ave revealed no remaining tumor. CT scans of February 2013 showed no evidence of lymphoma. 2. Hospitalization 03/12 through 12/25/2010 for a gastrointestinal bleed, presumably secondary to lymphoma, resolved. 3. Anemia secondary to gastrointestinal bleeding and chemotherapy-resolved. 4. Hypermetabolic lung nodule, question primary lung cancer versus involvement of the lung with non-Hodgkin's lymphoma. The nodule was smaller and less hypermetabolic on the PET scan 03/05/2011 suggesting a benign process or lymphoma. The nodule was again smaller on the CT 06/11/2011. The restaging CT 12/07/2011 revealed a stable right lower lobe nodule. CT 12/05/2012 reveals enlargement of the right lower lobe nodule and a new lesion at the left upper lung (? Inflammatory). Status post a biopsy of the right lower lobe nodule on 01/05/2013 with the biopsy confirming a B-cell non-Hodgkin's lymphoma. Staging PET scan 01/18/2013 revealed hypermetabolic activity involving lung nodules and a right jugular lymph node  5. History of abdominal pain, likely secondary to the gastric mass and surrounding lymphadenopathy. 6. Increased FDG activity of the pituitary gland on a PET scan 11/20/2010 and an MRI on 12/03/2010 with 2 hypermetabolic subcentimeter hypoenhancing lesions within the sella turcica that appeared to be distinct. The radiologist felt the differential diagnosis included a pituitary microadenoma, lymphomatous involvement of the pituitary, or combination of the two. The hypermetabolic activity of the pituitary persisted on the PET scan 03/05/2011. He was evaluated by Dr. Wynetta Emery and a repeat  MRI of the brain revealed no significant change. Dr. Wynetta Emery feels the lesions are likely benign and recommends an observation approach with serial scans. The lesions were unchanged on an MRI of  the brain 07/26/2012. 7. Diabetes. 8. Hypertension. 9. History of prostate cancer. 10. History of arthralgias secondary to Neulasta, relieved with Tylenol. 11. History of left leg edema, status post negative Doppler 02/13/2011. 12. Benign essential tremor. He takes propranolol. 13. Status post upper endoscopy 08/19/2011-there were no ulcers, erosions, masses or polyps noted. There was a small hiatal hernia. 14. Progressive memory deficit-improved with Aricept and Namenda.  Disposition-Nicholas Olsen appears asymptomatic from the non-Hodgkin's lymphoma. He was recently evaluated by Dr. Peggye Pitt at [facility]. Dr. Truett Perna has spoken with Dr. Peggye Pitt. They discussed observation versus a trial of bendamustine/Rituxan.  As Nicholas Olsen is asymptomatic, Dr. Truett Perna favors observation with a restaging CT evaluation at a three-month interval. Nicholas Olsen are comfortable with this approach. They are planning to explore "natural treatments" between now and the return visit.  He will return for a followup visit in 3 weeks for additional discussion. They will contact the office in the interim with any problems.  Plan reviewed with Dr. Truett Perna.  Lonna Cobb ANP/GNP-BC

## 2013-02-23 NOTE — Telephone Encounter (Signed)
gv pt appt schedule for June.  °

## 2013-02-24 ENCOUNTER — Other Ambulatory Visit: Payer: Self-pay | Admitting: Nurse Practitioner

## 2013-02-24 DIAGNOSIS — C8330 Diffuse large B-cell lymphoma, unspecified site: Secondary | ICD-10-CM

## 2013-02-27 ENCOUNTER — Telehealth: Payer: Self-pay | Admitting: Oncology

## 2013-02-27 NOTE — Telephone Encounter (Signed)
s.w. pt wife and advised on July ct....ok and aware

## 2013-03-16 ENCOUNTER — Ambulatory Visit (HOSPITAL_BASED_OUTPATIENT_CLINIC_OR_DEPARTMENT_OTHER): Payer: Medicare PPO | Admitting: Oncology

## 2013-03-16 ENCOUNTER — Other Ambulatory Visit (HOSPITAL_BASED_OUTPATIENT_CLINIC_OR_DEPARTMENT_OTHER): Payer: Medicare PPO | Admitting: Lab

## 2013-03-16 ENCOUNTER — Telehealth: Payer: Self-pay | Admitting: Oncology

## 2013-03-16 VITALS — BP 120/55 | HR 71 | Temp 97.7°F | Resp 18 | Ht 72.0 in | Wt 206.1 lb

## 2013-03-16 DIAGNOSIS — C8583 Other specified types of non-Hodgkin lymphoma, intra-abdominal lymph nodes: Secondary | ICD-10-CM

## 2013-03-16 DIAGNOSIS — I10 Essential (primary) hypertension: Secondary | ICD-10-CM

## 2013-03-16 DIAGNOSIS — C8330 Diffuse large B-cell lymphoma, unspecified site: Secondary | ICD-10-CM

## 2013-03-16 DIAGNOSIS — E119 Type 2 diabetes mellitus without complications: Secondary | ICD-10-CM

## 2013-03-16 LAB — COMPREHENSIVE METABOLIC PANEL (CC13)
Albumin: 3.6 g/dL (ref 3.5–5.0)
BUN: 14.1 mg/dL (ref 7.0–26.0)
Calcium: 9.5 mg/dL (ref 8.4–10.4)
Chloride: 101 mEq/L (ref 98–107)
Glucose: 159 mg/dl — ABNORMAL HIGH (ref 70–99)
Potassium: 3.6 mEq/L (ref 3.5–5.1)

## 2013-03-16 LAB — CBC WITH DIFFERENTIAL/PLATELET
Basophils Absolute: 0 10*3/uL (ref 0.0–0.1)
Eosinophils Absolute: 0.2 10*3/uL (ref 0.0–0.5)
HGB: 12.6 g/dL — ABNORMAL LOW (ref 13.0–17.1)
MONO#: 0.5 10*3/uL (ref 0.1–0.9)
NEUT#: 3.3 10*3/uL (ref 1.5–6.5)
RDW: 15.9 % — ABNORMAL HIGH (ref 11.0–14.6)
lymph#: 2 10*3/uL (ref 0.9–3.3)

## 2013-03-16 NOTE — Telephone Encounter (Signed)
gv and printed appt sched and avs for pt  °

## 2013-03-16 NOTE — Progress Notes (Signed)
[facility]    OFFICE PROGRESS NOTE   INTERVAL HISTORY:   Returns as scheduled. He feels well. He is working in the yard and garden daily. No fever, night sweats, or anorexia. He has a cough when he is outside. He relates this to allergies. No dyspnea.  Objective:  Vital signs in last 24 hours:  Blood pressure 120/55, pulse 71, temperature 97.7 F (36.5 C), temperature source Oral, resp. rate 18, height 6' (1.829 m), weight 206 lb 1.6 oz (93.486 kg), SpO2 100.00%.    HEENT: Neck without mass Lymphatics: No cervical, supraclavicular, axillary, or inguinal nodes Resp: Lungs clear bilaterally Cardio: Regular rate and rhythm GI: Nontender, no hepatosplenomegaly Vascular: No leg edema   Lab Results:  Lab Results  Component Value Date   WBC 6.0 03/16/2013   HGB 12.6* 03/16/2013   HCT 37.4* 03/16/2013   MCV 91.2 03/16/2013   PLT 196 03/16/2013      Medications: I have reviewed the patient's current medications.  Assessment/Plan: 1. 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 Staging PET scan was consistent with hypermetabolic lymphoma involving the stomach and perigastric lymph nodes. Bone marrow biopsy on 11/24/2010 was negative for evidence of involvement with lymphoma. He began treatment with CHOP/Rituxan on 12/04/2010. Restaging PET scan 03/03/2011 revealed resolution of hypermetabolic activity at perigastric lymph nodes and decrease in the hypermetabolic activity at the stomach. He completed the 6th and final cycle of systemic therapy on 03/27/2011. An upper endoscopy on 01/01/2011 and 08/19/2011 by Dr. Loreta Ave revealed no remaining tumor. CT scans of February 2013 showed no evidence of lymphoma. 2. Hospitalization 03/12 through 12/25/2010 for a gastrointestinal bleed, presumably secondary to lymphoma, resolved. 3. Anemia secondary to gastrointestinal bleeding and chemotherapy-resolved. 4. Hypermetabolic lung nodule,  question primary lung cancer versus involvement of the lung with non-Hodgkin's lymphoma. The nodule was smaller and less hypermetabolic on the PET scan 03/05/2011 suggesting a benign process or lymphoma. The nodule was again smaller on the CT 06/11/2011. The restaging CT 12/07/2011 revealed a stable right lower lobe nodule. CT 12/05/2012 reveals enlargement of the right lower lobe nodule and a new lesion at the left upper lung (? Inflammatory). Status post a biopsy of the right lower lobe nodule on 01/05/2013 with the biopsy confirming a B-cell non-Hodgkin's lymphoma. Staging PET scan 01/18/2013 revealed hypermetabolic activity involving lung nodules and a right jugular lymph node  5. History of abdominal pain, likely secondary to the gastric mass and surrounding lymphadenopathy. 6. Increased FDG activity of the pituitary gland on a PET scan 11/20/2010 and an MRI on 12/03/2010 with 2 hypermetabolic subcentimeter hypoenhancing lesions within the sella turcica that appeared to be distinct. The radiologist felt the differential diagnosis included a pituitary microadenoma, lymphomatous involvement of the pituitary, or combination of the two. The hypermetabolic activity of the pituitary persisted on the PET scan 03/05/2011. He was evaluated by Dr. Wynetta Emery and a repeat MRI of the brain revealed no significant change. Dr. Wynetta Emery feels the lesions are likely benign and recommends an observation approach with serial scans. The lesions were unchanged on an MRI of the brain 07/26/2012. 7. Diabetes. 8. Hypertension. 9. History of prostate cancer. 10. History of arthralgias secondary to Neulasta, relieved with Tylenol. 11. History of left leg edema, status post negative Doppler 02/13/2011. 12. Benign essential tremor. He takes propranolol. 13. Status post upper endoscopy 08/19/2011-there were no ulcers, erosions, masses or polyps noted. There was a small hiatal hernia. 14. Progressive memory deficit-improved with  Aricept  and Namenda.   Disposition:  Nicholas Olsen appears asymptomatic from the non-Hodgkin's lymphoma. I discussed the case with Dr. Peggye Pitt. The plan is to continue an observation approach for now. He is scheduled for a repeat CT of the chest on 04/17/2013. Nicholas Olsen will return for an office visit on 04/19/2013. We will consider bendamustine/rituximab therapy if there is significant progression on the restaging CT.   Thornton Papas, MD  03/16/2013  4:11 PM

## 2013-03-17 ENCOUNTER — Ambulatory Visit: Payer: Medicare PPO | Admitting: Family Medicine

## 2013-03-22 ENCOUNTER — Ambulatory Visit: Payer: Medicare PPO | Admitting: Family Medicine

## 2013-04-07 ENCOUNTER — Ambulatory Visit (INDEPENDENT_AMBULATORY_CARE_PROVIDER_SITE_OTHER): Payer: Medicare PPO | Admitting: Family Medicine

## 2013-04-07 ENCOUNTER — Encounter: Payer: Self-pay | Admitting: Family Medicine

## 2013-04-07 VITALS — BP 138/81 | HR 56 | Temp 97.8°F | Ht 72.0 in | Wt 203.5 lb

## 2013-04-07 DIAGNOSIS — C8589 Other specified types of non-Hodgkin lymphoma, extranodal and solid organ sites: Secondary | ICD-10-CM

## 2013-04-07 DIAGNOSIS — C8330 Diffuse large B-cell lymphoma, unspecified site: Secondary | ICD-10-CM

## 2013-04-07 DIAGNOSIS — F0150 Vascular dementia without behavioral disturbance: Secondary | ICD-10-CM

## 2013-04-07 DIAGNOSIS — E039 Hypothyroidism, unspecified: Secondary | ICD-10-CM

## 2013-04-07 DIAGNOSIS — E1159 Type 2 diabetes mellitus with other circulatory complications: Secondary | ICD-10-CM

## 2013-04-07 LAB — POCT GLYCOSYLATED HEMOGLOBIN (HGB A1C): Hemoglobin A1C: 7.6

## 2013-04-07 LAB — TSH: TSH: 3.489 u[IU]/mL (ref 0.350–4.500)

## 2013-04-07 NOTE — Assessment & Plan Note (Signed)
Check TSH 

## 2013-04-07 NOTE — Assessment & Plan Note (Signed)
Doing quite well on current combo.  No changes.

## 2013-04-07 NOTE — Assessment & Plan Note (Signed)
Sub optimal control.  Diet changes for now.

## 2013-04-07 NOTE — Patient Instructions (Addendum)
I am very pleased with your overall status Watch the sweets - your diabetes could be under better control.  Your A1C today was 7.6 and we want it<7.0. Stay on all the same medicines. See me in three months.  Sooner if problems.

## 2013-04-07 NOTE — Assessment & Plan Note (Signed)
See diabetes.  I am reluctant to start a strict diet when he has likely upcoming chemo.

## 2013-04-07 NOTE — Progress Notes (Signed)
  Subjective:    Patient ID: Nicholas Olsen, male    DOB: 03/17/1934, 79 y.o.   MRN: [id number]  HPI Patient has a couple of issues.   1. Memory problems have nicely stabilized on combo of aricept and namenda.   2. He has a biopsy proven recurrance of his lymphoma.  The treatment team is formulating recommendations at this time. 3. DM recheck - his A1C is slightly higher.  Admits to eating more sweets.  I do not want him losing weight at this time since he will likely receive some chemo soon. 4. HBP control is good.     Review of Systems     Objective:   Physical ExamLungs clear Cardiac RRR without m or g        Assessment & Plan:

## 2013-04-17 ENCOUNTER — Encounter (HOSPITAL_COMMUNITY): Payer: Self-pay

## 2013-04-17 ENCOUNTER — Ambulatory Visit (HOSPITAL_COMMUNITY)
Admission: RE | Admit: 2013-04-17 | Discharge: 2013-04-17 | Disposition: A | Discharge status: Home / Self Care | Payer: Medicare PPO | Source: Ambulatory Visit | Attending: Nurse Practitioner | Admitting: Nurse Practitioner

## 2013-04-17 DIAGNOSIS — R911 Solitary pulmonary nodule: Secondary | ICD-10-CM | POA: Insufficient documentation

## 2013-04-17 DIAGNOSIS — C8589 Other specified types of non-Hodgkin lymphoma, extranodal and solid organ sites: Secondary | ICD-10-CM | POA: Insufficient documentation

## 2013-04-17 DIAGNOSIS — K7689 Other specified diseases of liver: Secondary | ICD-10-CM | POA: Insufficient documentation

## 2013-04-17 DIAGNOSIS — I2510 Atherosclerotic heart disease of native coronary artery without angina pectoris: Secondary | ICD-10-CM | POA: Insufficient documentation

## 2013-04-17 DIAGNOSIS — C8330 Diffuse large B-cell lymphoma, unspecified site: Secondary | ICD-10-CM

## 2013-04-19 ENCOUNTER — Telehealth: Payer: Self-pay | Admitting: Oncology

## 2013-04-19 ENCOUNTER — Ambulatory Visit (HOSPITAL_BASED_OUTPATIENT_CLINIC_OR_DEPARTMENT_OTHER): Payer: Medicare PPO | Admitting: Oncology

## 2013-04-19 VITALS — BP 133/68 | HR 83 | Temp 97.9°F | Resp 18 | Ht 72.0 in | Wt 202.0 lb

## 2013-04-19 DIAGNOSIS — C8330 Diffuse large B-cell lymphoma, unspecified site: Secondary | ICD-10-CM

## 2013-04-19 DIAGNOSIS — C8589 Other specified types of non-Hodgkin lymphoma, extranodal and solid organ sites: Secondary | ICD-10-CM

## 2013-04-19 NOTE — Progress Notes (Signed)
[facility]    OFFICE PROGRESS NOTE   INTERVAL HISTORY:   He returns as scheduled. He feels well. Good appetite and energy level. He is working at home. No shortness of breath.  Objective:  Vital signs in last 24 hours:  Blood pressure 133/68, pulse 83, temperature 97.9 F (36.6 C), temperature source Oral, resp. rate 18, height 6' (1.829 m), weight 202 lb (91.627 kg).    HEENT: neck without mass Lymphatics: no cervical, supra-clavicular, axillary, or inguinal nodes Resp: lungs clear bilaterally Cardio: regular rate and rhythm GI: no hepatosplenomegaly Vascular: no leg edema   Lab Results:  Lab Results  Component Value Date   WBC 6.0 03/16/2013   HGB 12.6* 03/16/2013   HCT 37.4* 03/16/2013   MCV 91.2 03/16/2013   PLT 196 03/16/2013   X-rays: CT the chest on 04/17/2013-no mediastinal or axillary lymphadenopathy.stable 1.9 cm right lower lobe nodule compared to the 2014 imaging studies, but enlarged compared to 12/07/2011. Minimal increase in the airspace consolidation at the anterior left upper lobe now measuring 2 cm (previously 1.8 cm)   Medications: I have reviewed the patient's current medications.  Assessment/Plan: 1. 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 Staging PET scan was consistent with hypermetabolic lymphoma involving the stomach and perigastric lymph nodes. Bone marrow biopsy on 11/24/2010 was negative for evidence of involvement with lymphoma. He began treatment with CHOP/Rituxan on 12/04/2010. Restaging PET scan 03/03/2011 revealed resolution of hypermetabolic activity at perigastric lymph nodes and decrease in the hypermetabolic activity at the stomach. He completed the 6th and final cycle of systemic therapy on 03/27/2011. An upper endoscopy on 01/01/2011 and 08/19/2011 by Dr. Loreta Ave revealed no remaining tumor. CT scans of February 2013 showed no evidence of lymphoma. 2. Hospitalization 03/12  through 12/25/2010 for a gastrointestinal bleed, presumably secondary to lymphoma, resolved. 3. Anemia secondary to gastrointestinal bleeding and chemotherapy-resolved. 4. Hypermetabolic lung nodule, question primary lung cancer versus involvement of the lung with non-Hodgkin's lymphoma. The nodule was smaller and less hypermetabolic on the PET scan 03/05/2011 suggesting a benign process or lymphoma. The nodule was again smaller on the CT 06/11/2011. The restaging CT 12/07/2011 revealed a stable right lower lobe nodule. CT 12/05/2012 reveals enlargement of the right lower lobe nodule and a new lesion at the left upper lung (? Inflammatory). Status post a biopsy of the right lower lobe nodule on 01/05/2013 with the biopsy confirming a B-cell non-Hodgkin's lymphoma. Staging PET scan 01/18/2013 revealed hypermetabolic activity involving lung nodules and a right jugular lymph node  -restaging CT 04/17/2013 with a stable right lower lobe nodule and a slight increase in the area of consolidation at the left upper lung 5. History of abdominal pain, likely secondary to the gastric mass and surrounding lymphadenopathy. 6. Increased FDG activity of the pituitary gland on a PET scan 11/20/2010 and an MRI on 12/03/2010 with 2 hypermetabolic subcentimeter hypoenhancing lesions within the sella turcica that appeared to be distinct. The radiologist felt the differential diagnosis included a pituitary microadenoma, lymphomatous involvement of the pituitary, or combination of the two. The hypermetabolic activity of the pituitary persisted on the PET scan 03/05/2011. He was evaluated by Dr. Wynetta Emery and a repeat MRI of the brain revealed no significant change. Dr. Wynetta Emery feels the lesions are likely benign and recommends an observation approach with serial scans. The lesions were unchanged on an MRI of the brain 07/26/2012. 7. Diabetes. 8. Hypertension. 9. History of prostate cancer. 10. History of arthralgias  secondary to  Neulasta, relieved with Tylenol. 11. History of left leg edema, status post negative Doppler 02/13/2011. 12. Benign essential tremor. He takes propranolol. 13. Status post upper endoscopy 08/19/2011-there were no ulcers, erosions, masses or polyps noted. There was a small hiatal hernia. 14. Progressive memory deficit-improved with Aricept and Namenda.   Disposition:  Nicholas Olsen appears stable. He is asymptomatic from the non-Hodgkin's lymphoma. I reviewed the 04/17/2013 CT with Nicholas Olsen and Nicholas Olsen. He has a good performance status. We decided to continue an observation approach. He will return for an office visit in approximately 2 months. We will consider obtaining another CT of the chest in approximately 6 months.   Nicholas Papas, MD  04/19/2013  11:03 AM

## 2013-04-19 NOTE — Telephone Encounter (Signed)
gv and printed appt sched and avs for pt  °

## 2013-04-28 ENCOUNTER — Encounter: Payer: Self-pay | Admitting: Family Medicine

## 2013-04-28 ENCOUNTER — Ambulatory Visit (INDEPENDENT_AMBULATORY_CARE_PROVIDER_SITE_OTHER): Payer: Medicare PPO | Admitting: Family Medicine

## 2013-04-28 VITALS — BP 139/83 | HR 59 | Ht 73.0 in | Wt 207.0 lb

## 2013-04-28 DIAGNOSIS — B356 Tinea cruris: Secondary | ICD-10-CM | POA: Insufficient documentation

## 2013-04-28 MED ORDER — CLOTRIMAZOLE-BETAMETHASONE 1-0.05 % EX CREA: TOPICAL_CREAM | Freq: Two times a day (BID) | CUTANEOUS | Status: DC

## 2013-04-28 NOTE — Patient Instructions (Addendum)
I sent in a new cream for the groin rash.  Use it twice daily. I will be happy to snip those skin things off.  Make a separate appointment for that minor surgery.  Do not take aspirin for five days before that skin surgery.  That way there will be less bleeding.

## 2013-04-28 NOTE — Progress Notes (Signed)
  Subjective:    Patient ID: Nicholas Olsen, male    DOB: [birth year], 79 y.o.   MRN: [id number]  HPI Groin rash, very pruritic, present for months..  Has tried OTCs    Review of Systems     Objective:   Physical Exam Groin rash looks like mild tinea or candida.  The intensity of itching and duration and the look of mild erythema/post inflamatory hyperpigmentation suggest an inflamatory or scratch.itch cycle problem        Assessment & Plan:

## 2013-04-28 NOTE — Assessment & Plan Note (Signed)
  Because of intense pruritis will treat with combo steroid/antifungal

## 2013-05-19 ENCOUNTER — Encounter: Payer: Self-pay | Admitting: Family Medicine

## 2013-05-19 ENCOUNTER — Ambulatory Visit: Payer: Medicare PPO | Admitting: Family Medicine

## 2013-05-19 ENCOUNTER — Ambulatory Visit (INDEPENDENT_AMBULATORY_CARE_PROVIDER_SITE_OTHER): Payer: Medicare PPO | Admitting: Family Medicine

## 2013-05-19 VITALS — BP 135/78 | HR 59 | Temp 97.7°F | Ht 73.0 in | Wt 202.5 lb

## 2013-05-19 DIAGNOSIS — D239 Other benign neoplasm of skin, unspecified: Secondary | ICD-10-CM | POA: Insufficient documentation

## 2013-05-19 NOTE — Progress Notes (Signed)
Subjective:     Patient ID: Nicholas Olsen, male   DOB: July 15, 1934, 79 y.o.   MRN: [id number]  HPI  In for elective removal of several benign skin lesions.     Review of Systems     Objective:   Physical Exam Informed consent.  Xylocaine with epi Remove Fleshy mole ant neck Fleshy mole post neck Skin tag left xygoma Fibroma Rt frontal scalp   Patient tolerated procedure well      Assessment:     See problem list     Plan:     See problem list

## 2013-05-19 NOTE — Assessment & Plan Note (Signed)
Multiple removed per note

## 2013-05-19 NOTE — Patient Instructions (Signed)
Watch for signs of bleeding or infection.

## 2013-06-01 ENCOUNTER — Telehealth: Payer: Self-pay | Admitting: Family Medicine

## 2013-06-01 NOTE — Telephone Encounter (Signed)
While taking message from patient, Dr. Leveda Anna called the patient back.

## 2013-06-01 NOTE — Telephone Encounter (Signed)
Pt is calling to have Dr. Leveda Anna call him. He didn't want to go into any details over the phone. JW

## 2013-06-26 ENCOUNTER — Ambulatory Visit (HOSPITAL_BASED_OUTPATIENT_CLINIC_OR_DEPARTMENT_OTHER): Payer: Medicare PPO | Admitting: Oncology

## 2013-06-26 ENCOUNTER — Other Ambulatory Visit: Payer: Self-pay | Admitting: Family Medicine

## 2013-06-26 ENCOUNTER — Telehealth: Payer: Self-pay | Admitting: Oncology

## 2013-06-26 ENCOUNTER — Other Ambulatory Visit: Payer: Self-pay | Admitting: *Deleted

## 2013-06-26 VITALS — BP 154/70 | HR 63 | Temp 97.8°F | Resp 20 | Ht 73.0 in | Wt 203.5 lb

## 2013-06-26 DIAGNOSIS — C8589 Other specified types of non-Hodgkin lymphoma, extranodal and solid organ sites: Secondary | ICD-10-CM

## 2013-06-26 DIAGNOSIS — C8330 Diffuse large B-cell lymphoma, unspecified site: Secondary | ICD-10-CM

## 2013-06-26 NOTE — Progress Notes (Signed)
[facility]    OFFICE PROGRESS NOTE   INTERVAL HISTORY:   He returns as scheduled. No specific complaint. No fever, night sweats, anorexia, or dyspnea. He reports a good energy level.  Objective:  Vital signs in last 24 hours:  Blood pressure 154/70, pulse 63, temperature 97.8 F (36.6 C), temperature source Oral, resp. rate 20, height 6\' 1"  (1.854 m), weight 203 lb 8 oz (92.307 kg).    HEENT: Neck without mass Lymphatics: No cervical, supraclavicular, axillary, or inguinal nodes Resp: Lungs clear bilaterally, no respiratory distress Cardio: Regular rate and rhythm GI: Nontender, no hepatosplenomegaly, no mass Vascular: No leg edema Neuro: Alert and oriented to situation, follows commands     Medications: I have reviewed the patient's current medications.  Assessment/Plan: 1. 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 Staging PET scan was consistent with hypermetabolic lymphoma involving the stomach and perigastric lymph nodes. Bone marrow biopsy on 11/24/2010 was negative for evidence of involvement with lymphoma. He began treatment with CHOP/Rituxan on 12/04/2010. Restaging PET scan 03/03/2011 revealed resolution of hypermetabolic activity at perigastric lymph nodes and decrease in the hypermetabolic activity at the stomach. He completed the 6th and final cycle of systemic therapy on 03/27/2011. An upper endoscopy on 01/01/2011 and 08/19/2011 by Dr. Loreta Ave revealed no remaining tumor. CT scans of February 2013 showed no evidence of lymphoma. 2. Hospitalization 03/12 through 12/25/2010 for a gastrointestinal bleed, presumably secondary to lymphoma, resolved. 3. Anemia secondary to gastrointestinal bleeding and chemotherapy-resolved. 4. Hypermetabolic lung nodule, question primary lung cancer versus involvement of the lung with non-Hodgkin's lymphoma. The nodule was smaller and less hypermetabolic on the PET scan  03/05/2011 suggesting a benign process or lymphoma. The nodule was again smaller on the CT 06/11/2011. The restaging CT 12/07/2011 revealed a stable right lower lobe nodule. CT 12/05/2012 reveals enlargement of the right lower lobe nodule and a new lesion at the left upper lung (? Inflammatory). Status post a biopsy of the right lower lobe nodule on 01/05/2013 with the biopsy confirming a B-cell non-Hodgkin's lymphoma. Staging PET scan 01/18/2013 revealed hypermetabolic activity involving lung nodules and a right jugular lymph node  -restaging CT 04/17/2013 with a stable right lower lobe nodule and a slight increase in the area of consolidation at the left upper lung  5. History of abdominal pain, likely secondary to the gastric mass and surrounding lymphadenopathy. 6. Increased FDG activity of the pituitary gland on a PET scan 11/20/2010 and an MRI on 12/03/2010 with 2 hypermetabolic subcentimeter hypoenhancing lesions within the sella turcica that appeared to be distinct. The radiologist felt the differential diagnosis included a pituitary microadenoma, lymphomatous involvement of the pituitary, or combination of the two. The hypermetabolic activity of the pituitary persisted on the PET scan 03/05/2011. He was evaluated by Dr. Wynetta Emery and a repeat MRI of the brain revealed no significant change. Dr. Wynetta Emery feels the lesions are likely benign and recommends an observation approach with serial scans. The lesions were unchanged on an MRI of the brain 07/26/2012. 7. Diabetes. 8. Hypertension. 9. History of prostate cancer. 10. History of arthralgias secondary to Neulasta, relieved with Tylenol. 11. History of left leg edema, status post negative Doppler 02/13/2011. 12. Benign essential tremor. He takes propranolol. 13. Status post upper endoscopy 08/19/2011-there were no ulcers, erosions, masses or polyps noted. There was a small hiatal hernia. 14. Progressive memory deficit-improved with Aricept and  Namenda.  Disposition:  Mr. Escandon appears stable. I discussed the current situation  with Mr. Manzo and his son. The plan is to follow him with an observation approach for the non-Hodgkin's lymphoma. He will be scheduled for an office visit and restaging CT of the chest in 4 months. Mr. Punt is to contact us in the interim for new symptoms.   Thornton Papas, MD  06/26/2013  11:12 AM

## 2013-06-26 NOTE — Telephone Encounter (Signed)
Gave pt appt for MD on January 2015

## 2013-06-28 ENCOUNTER — Encounter: Payer: Self-pay | Admitting: Family Medicine

## 2013-06-28 ENCOUNTER — Ambulatory Visit (INDEPENDENT_AMBULATORY_CARE_PROVIDER_SITE_OTHER): Payer: Medicare PPO | Admitting: Family Medicine

## 2013-06-28 VITALS — BP 130/78 | HR 64 | Ht 73.0 in | Wt 202.0 lb

## 2013-06-28 DIAGNOSIS — E1159 Type 2 diabetes mellitus with other circulatory complications: Secondary | ICD-10-CM

## 2013-06-28 DIAGNOSIS — I10 Essential (primary) hypertension: Secondary | ICD-10-CM

## 2013-06-28 DIAGNOSIS — Z23 Encounter for immunization: Secondary | ICD-10-CM

## 2013-06-28 DIAGNOSIS — F0150 Vascular dementia without behavioral disturbance: Secondary | ICD-10-CM

## 2013-06-28 NOTE — Patient Instructions (Addendum)
See me in 2 months.  I will do blood work at that time. I am please that things are going well. I am delighted that the memory is in better shape. Call me if I need to fill out any forms for the Namenda.

## 2013-06-29 NOTE — Progress Notes (Signed)
  Subjective:    Patient ID: Nicholas Olsen, male    DOB: December 20, 1933, 79 y.o.   MRN: [id number]  HPI  Not here for mole removal.  Here for recheck of problems DM, too early for A1C.  Discussed weight.  Ideally, I would like him 10 lbs lighter.  Last A1C had mild elevation.  Due for foot exam. Blood pressure great.  At his age, goal <150 systolic. Tolerating meds well Recently saw heme onc and got good report.  For CT already ordered. Dementia - nice response to namenda, arcept combo.  Cost of namenda is a concern.  They will check with drug manufacture.    Review of Systems     Objective:   Physical Exam  Diabetic foot exam.  Normal pulses slight decreased sensation.          Assessment & Plan:

## 2013-06-29 NOTE — Assessment & Plan Note (Signed)
Patient and wife nicely satisfied on aricept, namenda combo.  She will check drug manufacturer web site to see if eligible for discount.

## 2013-06-29 NOTE — Assessment & Plan Note (Signed)
No med changes.  I would like to see 10 lbs of weight loss.

## 2013-08-11 ENCOUNTER — Encounter: Payer: Self-pay | Admitting: Family Medicine

## 2013-08-11 ENCOUNTER — Ambulatory Visit (INDEPENDENT_AMBULATORY_CARE_PROVIDER_SITE_OTHER): Payer: Medicare PPO | Admitting: Family Medicine

## 2013-08-11 VITALS — BP 141/65 | HR 60 | Temp 98.2°F | Ht 73.0 in | Wt 205.0 lb

## 2013-08-11 DIAGNOSIS — I10 Essential (primary) hypertension: Secondary | ICD-10-CM

## 2013-08-11 DIAGNOSIS — E1159 Type 2 diabetes mellitus with other circulatory complications: Secondary | ICD-10-CM

## 2013-08-11 DIAGNOSIS — F0150 Vascular dementia without behavioral disturbance: Secondary | ICD-10-CM

## 2013-08-11 LAB — POCT GLYCOSYLATED HEMOGLOBIN (HGB A1C): Hemoglobin A1C: 7.7

## 2013-08-11 NOTE — Assessment & Plan Note (Signed)
Fair control.  No change in meds.

## 2013-08-11 NOTE — Assessment & Plan Note (Signed)
Stable on current meds.  Good functional status.

## 2013-08-11 NOTE — Patient Instructions (Addendum)
Make sure you have your eye doctor send me a report after your exam.   Your A1C is 7.7 which is about the same as it has been. Your weight and blood pressure are good.   See me in three months. No blood work today.

## 2013-08-11 NOTE — Assessment & Plan Note (Signed)
Good control

## 2013-08-11 NOTE — Progress Notes (Signed)
  Subjective:    Patient ID: Nicholas Olsen, male    DOB: 1934/06/06, 79 y.o.   MRN: [id number]  HPI Doing well, no complaints.  "I feel I've got my life back."  Seems to have significant memory/cognative improvement on current meds.   Other chronic issues: 1. A1C is OK.  No changes in meds.  He is actively exercising.  Has visit planned with ophth. 2. HBP at goal.     Review of Systems     Objective:   Physical Exam Lungs clear Cardiac RRR without m or g         Assessment & Plan:

## 2013-08-15 ENCOUNTER — Telehealth: Payer: Self-pay | Admitting: Family Medicine

## 2013-08-15 ENCOUNTER — Telehealth: Payer: Self-pay

## 2013-08-15 NOTE — Telephone Encounter (Signed)
Wife calls wanting to speak to Dr. Leveda Anna about medication patient takes (memantine). She states it is urgent that she receives a call back. [phone].

## 2013-08-15 NOTE — Telephone Encounter (Signed)
The wife called back and was able to find a coupon for a FREE 30 day prescription of Namenda XR. It comes in 7, 14,21,28 mg. So she would like Dr. Leveda Anna to write her a prescription hard copy that she can take to the pharmacy. She is not sure which pharmacy will except the coupon, and that is why she wants a hard copy in case she has to go to more than one. Also the only concern she has is if Namenda regular and Namenda XR are totally different from each other. Also she would like a hard copy of Namenda regular in case the XR doesn't work out and she can call around to see if she can buy the regular Namenda out of pocket. If you have any questions and when the prescription is ready for pick up call her at [phone]. She would like and hope that she can get this today. JW

## 2013-08-15 NOTE — Telephone Encounter (Signed)
Patient would like to speak to Dr Leveda Anna only. Please advise. Cardell Rachel, Virgel Bouquet

## 2013-08-15 NOTE — Telephone Encounter (Signed)
Called and answered questions which were about affording namenda.  They are now in the donut hole.

## 2013-08-16 MED ORDER — MEMANTINE HCL 10 MG PO TABS: 10.0000 mg | ORAL_TABLET | Freq: Two times a day (BID) | ORAL | Status: DC

## 2013-08-16 MED ORDER — MEMANTINE HCL ER 21 MG PO CP24: 1.0000 | ORAL_CAPSULE | Freq: Every day | ORAL | Status: DC

## 2013-08-16 NOTE — Telephone Encounter (Signed)
Done

## 2013-08-17 ENCOUNTER — Ambulatory Visit: Payer: Self-pay | Admitting: Podiatrist

## 2013-08-17 NOTE — Telephone Encounter (Signed)
rx picked up

## 2013-09-21 ENCOUNTER — Encounter: Payer: Self-pay | Admitting: Podiatrist

## 2013-09-21 ENCOUNTER — Ambulatory Visit (INDEPENDENT_AMBULATORY_CARE_PROVIDER_SITE_OTHER): Payer: Medicare PPO | Admitting: Podiatrist

## 2013-09-21 VITALS — BP 124/77 | HR 62 | Resp 14

## 2013-09-21 DIAGNOSIS — B351 Tinea unguium: Secondary | ICD-10-CM

## 2013-09-21 DIAGNOSIS — M79609 Pain in unspecified limb: Secondary | ICD-10-CM

## 2013-09-21 NOTE — Progress Notes (Signed)
   Subjective:    Patient ID: CY BRESEE, male    DOB: April 15, 1934, 79 y.o.   MRN: [id number]  HPI Comments: " hopefully you can cut these nails as short as possible "      Review of Systems     Objective:   Physical Exam  Vascular exam reveals pulses at 1/4 dp, non palp pt. pulse noted. Multiple varicosities are present. No swelling is noted. Neurological sensation is intact and unchanged. Dermatological examination reveals symptomatic mycotic toenails 1 through 5 left and 1 and 2 on the right foot. Digital nails 3,4 and 5 right have previously been permanently removed. Contracted hammertoe deformities are also present bilaterally.     Assessment & Plan:  Assessment: Symptomatic mycotic toenails, hammertoe deformity bilateral  Plan: Discussed etiology, pathology, conservative vs. Surgical therapies and at this time debridement of symptomatic toenails was recommended.  Onychoreduction of symptomatic toenails was performed without iatrogenic incident.  Patient was instructed on signs and symptoms of infection and was told to call immediately should any of these arise.  Recommended he try lambswool in between the toes for the symptomatic contracture deformity  Marlowe Aschoff DPM

## 2013-09-21 NOTE — Patient Instructions (Signed)
Try LAMBSWOOL that you can purchase at the drug store in the foot care isle  Diabetes and Foot Care Diabetes may cause you to have problems because of poor blood supply (circulation) to your feet and legs. This may cause the skin on your feet to become thinner, break easier, and heal more slowly. Your skin may become dry, and the skin may peel and crack. You may also have nerve damage in your legs and feet causing decreased feeling in them. You may not notice minor injuries to your feet that could lead to infections or more serious problems. Taking care of your feet is one of the most important things you can do for yourself.  HOME CARE INSTRUCTIONS  Wear shoes at all times, even in the house. Do not go barefoot. Bare feet are easily injured.  Check your feet daily for blisters, cuts, and redness. If you cannot see the bottom of your feet, use a mirror or ask someone for help.  Wash your feet with warm water (do not use hot water) and mild soap. Then pat your feet and the areas between your toes until they are completely dry. Do not soak your feet as this can dry your skin.  Apply a moisturizing lotion or petroleum jelly (that does not contain alcohol and is unscented) to the skin on your feet and to dry, brittle toenails. Do not apply lotion between your toes.  Trim your toenails straight across. Do not dig under them or around the cuticle. File the edges of your nails with an emery board or nail file.  Do not cut corns or calluses or try to remove them with medicine.  Wear clean socks or stockings every day. Make sure they are not too tight. Do not wear knee-high stockings since they may decrease blood flow to your legs.  Wear shoes that fit properly and have enough cushioning. To break in new shoes, wear them for just a few hours a day. This prevents you from injuring your feet. Always look in your shoes before you put them on to be sure there are no objects inside.  Do not cross your legs.  This may decrease the blood flow to your feet.  If you find a minor scrape, cut, or break in the skin on your feet, keep it and the skin around it clean and dry. These areas may be cleansed with mild soap and water. Do not cleanse the area with peroxide, alcohol, or iodine.  When you remove an adhesive bandage, be sure not to damage the skin around it.  If you have a wound, look at it several times a day to make sure it is healing.  Do not use heating pads or hot water bottles. They may burn your skin. If you have lost feeling in your feet or legs, you may not know it is happening until it is too late.  Make sure your health care provider performs a complete foot exam at least annually or more often if you have foot problems. Report any cuts, sores, or bruises to your health care provider immediately. SEEK MEDICAL CARE IF:   You have an injury that is not healing.  You have cuts or breaks in the skin.  You have an ingrown nail.  You notice redness on your legs or feet.  You feel burning or tingling in your legs or feet.  You have pain or cramps in your legs and feet.  Your legs or feet are numb.  Your feet always feel cold. SEEK IMMEDIATE MEDICAL CARE IF:   There is increasing redness, swelling, or pain in or around a wound.  There is a red line that goes up your leg.  Pus is coming from a wound.  You develop a fever or as directed by your health care provider.  You notice a bad smell coming from an ulcer or wound. Document Released: 09/25/2000 Document Revised: 05/31/2013 Document Reviewed: 03/07/2013 [facility] Patient Information 2014 [place].

## 2013-10-16 ENCOUNTER — Ambulatory Visit (HOSPITAL_COMMUNITY)
Admission: RE | Admit: 2013-10-16 | Discharge: 2013-10-16 | Disposition: A | Discharge status: Home / Self Care | Payer: Medicare PPO | Source: Ambulatory Visit | Attending: Oncology | Admitting: Oncology

## 2013-10-16 DIAGNOSIS — Z923 Personal history of irradiation: Secondary | ICD-10-CM | POA: Insufficient documentation

## 2013-10-16 DIAGNOSIS — C8330 Diffuse large B-cell lymphoma, unspecified site: Secondary | ICD-10-CM

## 2013-10-16 DIAGNOSIS — K7689 Other specified diseases of liver: Secondary | ICD-10-CM | POA: Insufficient documentation

## 2013-10-16 DIAGNOSIS — Z8546 Personal history of malignant neoplasm of prostate: Secondary | ICD-10-CM | POA: Insufficient documentation

## 2013-10-16 DIAGNOSIS — J984 Other disorders of lung: Secondary | ICD-10-CM | POA: Insufficient documentation

## 2013-10-16 DIAGNOSIS — Z9221 Personal history of antineoplastic chemotherapy: Secondary | ICD-10-CM | POA: Insufficient documentation

## 2013-10-16 DIAGNOSIS — C8589 Other specified types of non-Hodgkin lymphoma, extranodal and solid organ sites: Secondary | ICD-10-CM | POA: Insufficient documentation

## 2013-10-17 ENCOUNTER — Ambulatory Visit (HOSPITAL_BASED_OUTPATIENT_CLINIC_OR_DEPARTMENT_OTHER): Payer: Commercial Managed Care - HMO | Admitting: Oncology

## 2013-10-17 ENCOUNTER — Telehealth: Payer: Self-pay | Admitting: Oncology

## 2013-10-17 VITALS — BP 141/81 | HR 63 | Temp 97.2°F | Resp 19 | Ht 73.0 in | Wt 211.4 lb

## 2013-10-17 DIAGNOSIS — E119 Type 2 diabetes mellitus without complications: Secondary | ICD-10-CM

## 2013-10-17 DIAGNOSIS — C8330 Diffuse large B-cell lymphoma, unspecified site: Secondary | ICD-10-CM

## 2013-10-17 DIAGNOSIS — C8588 Other specified types of non-Hodgkin lymphoma, lymph nodes of multiple sites: Secondary | ICD-10-CM

## 2013-10-17 DIAGNOSIS — I10 Essential (primary) hypertension: Secondary | ICD-10-CM

## 2013-10-17 NOTE — Telephone Encounter (Signed)
lvm for pt regarding to April 2015 appts

## 2013-10-17 NOTE — Progress Notes (Signed)
[facility]    OFFICE PROGRESS NOTE   INTERVAL HISTORY:   Mr. Nicholas Olsen returns for scheduled followup of non-Hodgkin's lymphoma. He feels well. Ms. Usery reports he has a mild cough for the past 2 years. Good appetite and energy level. He is working around the home. No bleeding. No neurologic symptoms.  Objective:  Vital signs in last 24 hours:  Blood pressure 141/81, pulse 63, temperature 97.2 F (36.2 C), temperature source Oral, resp. rate 19, height $RemoveBe'6\' 1"'TIjfqtoDq$  (1.854 m), weight 211 lb 6.4 oz (95.89 kg).    HEENT: Neck without mass Lymphatics: No cervical, supra-clavicular, axillary, or inguinal nodes Resp: Lungs clear bilaterally Cardio: Regular rate and rhythm GI: No hepatosplenomegaly, nontender, no mass Vascular: No leg edema Neuro: Alert, follows commands, the motor exam appears grossly intact   X-rays: Restaging CT of the chest on 10/16/2013, compared to 04/17/2013: The subpleural mass at the left upper lobe is unchanged. The lobulated right lower lobe nodule has slightly enlarged, now measuring 1.9 1.8 x 2 cm. No other nodules.   Medications: I have reviewed the patient's current medications.  Assessment/Plan: 1. 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 Staging PET scan was consistent with hypermetabolic lymphoma involving the stomach and perigastric lymph nodes. Bone marrow biopsy on 11/24/2010 was negative for evidence of involvement with lymphoma. He began treatment with CHOP/Rituxan on 12/04/2010. Restaging PET scan 03/03/2011 revealed resolution of hypermetabolic activity at perigastric lymph nodes and decrease in the hypermetabolic activity at the stomach. He completed the 6th and final cycle of systemic therapy on 03/27/2011. An upper endoscopy on 01/01/2011 and 08/19/2011 by Dr. Collene Mares revealed no remaining tumor. CT scans of February 2013 showed no evidence of lymphoma. 2. Hospitalization 03/12 through  12/25/2010 for a gastrointestinal bleed, presumably secondary to lymphoma, resolved. 3. Anemia secondary to gastrointestinal bleeding and chemotherapy-resolved. 4. Hypermetabolic lung nodule, question primary lung cancer versus involvement of the lung with non-Hodgkin's lymphoma. The nodule was smaller and less hypermetabolic on the PET scan 70/62/3762 suggesting a benign process or lymphoma. The nodule was again smaller on the CT 06/11/2011. The restaging CT 12/07/2011 revealed a stable right lower lobe nodule. CT 12/05/2012 reveals enlargement of the right lower lobe nodule and a new lesion at the left upper lung (? Inflammatory). Status post a biopsy of the right lower lobe nodule on 01/05/2013 with the biopsy confirming a B-cell non-Hodgkin's lymphoma. Staging PET scan 01/18/2013 revealed hypermetabolic activity involving lung nodules and a right jugular lymph node  -restaging CT 04/17/2013 with a stable right lower lobe nodule and a slight increase in the area of consolidation at the left upper lung  -Restaging CT 10/16/2013, stable left upper lung lesion, slight enlargement of the right lower lobe nodule 5. History of abdominal pain, likely secondary to the gastric mass and surrounding lymphadenopathy. 6. Increased FDG activity of the pituitary gland on a PET scan 11/20/2010 and an MRI on 12/03/2010 with 2 hypermetabolic subcentimeter hypoenhancing lesions within the sella turcica that appeared to be distinct. The radiologist felt the differential diagnosis included a pituitary microadenoma, lymphomatous involvement of the pituitary, or combination of the two. The hypermetabolic activity of the pituitary persisted on the PET scan 03/05/2011. He was evaluated by Dr. Saintclair Halsted and a repeat MRI of the brain revealed no significant change. Dr. Saintclair Halsted feels the lesions are likely benign and recommends an observation approach with serial scans. The lesions were unchanged on an MRI of the brain  07/26/2012. 7.  Diabetes. 8. Hypertension. 9. History of prostate cancer. 10. History of arthralgias secondary to Neulasta, relieved with Tylenol. 11. History of left leg edema, status post negative Doppler 02/13/2011. 12. Benign essential tremor. He takes propranolol. 13. Status post upper endoscopy 08/19/2011-there were no ulcers, erosions, masses or polyps noted. There was a small hiatal hernia. 14. Progressive memory deficit-improved with Aricept and Namenda.  Disposition:  Mr. Hurtado appears stable. There is no clinical evidence for progression of the lymphoma. The chest CT is stable aside from a slight increase in the size of a right lung nodule. We decided to continue an observation approach. I reviewed the CT images with Mr. Mcclenathan and his wife. He will return for an office visit in 3 months. Ms. Castrejon will contact us in the interim if he develops new symptoms.   Betsy Coder, MD  10/17/2013  12:40 PM

## 2013-10-19 ENCOUNTER — Telehealth: Payer: Self-pay | Admitting: Family Medicine

## 2013-10-19 NOTE — Telephone Encounter (Signed)
Wife called and needs a refill sent to [facility] for Nicholas Olsen. She needs Namenda XR fax to them as soon as we can . jw

## 2013-10-20 MED ORDER — MEMANTINE HCL ER 21 MG PO CP24: 1.0000 | ORAL_CAPSULE | Freq: Every day | ORAL | Status: DC

## 2013-10-20 NOTE — Telephone Encounter (Signed)
Done electronically.

## 2013-10-23 NOTE — Telephone Encounter (Signed)
Spoke with patient and informed him of below 

## 2013-10-25 ENCOUNTER — Ambulatory Visit (INDEPENDENT_AMBULATORY_CARE_PROVIDER_SITE_OTHER): Payer: Medicare PPO | Admitting: Family Medicine

## 2013-10-25 ENCOUNTER — Encounter: Payer: Self-pay | Admitting: Family Medicine

## 2013-10-25 VITALS — BP 129/71 | HR 58 | Temp 98.2°F | Ht 73.0 in | Wt 206.7 lb

## 2013-10-25 DIAGNOSIS — C8330 Diffuse large B-cell lymphoma, unspecified site: Secondary | ICD-10-CM

## 2013-10-25 DIAGNOSIS — I10 Essential (primary) hypertension: Secondary | ICD-10-CM

## 2013-10-25 DIAGNOSIS — I739 Peripheral vascular disease, unspecified: Secondary | ICD-10-CM

## 2013-10-25 DIAGNOSIS — C8589 Other specified types of non-Hodgkin lymphoma, extranodal and solid organ sites: Secondary | ICD-10-CM

## 2013-10-25 DIAGNOSIS — R252 Cramp and spasm: Secondary | ICD-10-CM | POA: Insufficient documentation

## 2013-10-25 DIAGNOSIS — E7800 Pure hypercholesterolemia, unspecified: Secondary | ICD-10-CM

## 2013-10-25 LAB — LIPID PANEL
CHOL/HDL RATIO: 4.7 ratio
Cholesterol: 247 mg/dL — ABNORMAL HIGH (ref 0–200)
HDL: 53 mg/dL (ref >39)
LDL Cholesterol: 175 mg/dL — ABNORMAL HIGH (ref 0–99)
Triglycerides: 97 mg/dL (ref <150)
VLDL: 19 mg/dL (ref 0–40)

## 2013-10-25 LAB — COMPLETE METABOLIC PANEL WITH GFR
ALT: 24 U/L (ref 0–53)
AST: 30 U/L (ref 0–37)
Albumin: 4.2 g/dL (ref 3.5–5.2)
Alkaline Phosphatase: 68 U/L (ref 39–117)
BILIRUBIN TOTAL: 0.6 mg/dL (ref 0.3–1.2)
BUN: 24 mg/dL — AB (ref 6–23)
CO2: 30 mEq/L (ref 19–32)
CREATININE: 1.39 mg/dL — AB (ref 0.50–1.35)
Calcium: 10 mg/dL (ref 8.4–10.5)
Chloride: 98 mEq/L (ref 96–112)
GFR, EST NON AFRICAN AMERICAN: 49 mL/min — AB
GFR, Est African American: 56 mL/min — ABNORMAL LOW
GLUCOSE: 162 mg/dL — AB (ref 70–99)
Potassium: 3.5 mEq/L (ref 3.5–5.3)
Sodium: 138 mEq/L (ref 135–145)
Total Protein: 7.2 g/dL (ref 6.0–8.3)

## 2013-10-25 LAB — CBC
HEMATOCRIT: 39.8 % (ref 39.0–52.0)
Hemoglobin: 13.9 g/dL (ref 13.0–17.0)
MCH: 32.7 pg (ref 26.0–34.0)
MCHC: 34.9 g/dL (ref 30.0–36.0)
MCV: 93.6 fL (ref 78.0–100.0)
Platelets: 236 10*3/uL (ref 150–400)
RBC: 4.25 MIL/uL (ref 4.22–5.81)
RDW: 15.3 % (ref 11.5–15.5)
WBC: 5.9 10*3/uL (ref 4.0–10.5)

## 2013-10-25 LAB — MAGNESIUM: Magnesium: 1.5 mg/dL (ref 1.5–2.5)

## 2013-10-25 NOTE — Progress Notes (Signed)
   Subjective:    Patient ID: Nicholas Olsen, male    DOB: 1933-10-30, 80 y.o.   MRN: [id number]  HPI cramps in both calves and feet Left>right.  Cramps occur at rest.  He is very active in walking at y 3-4 miles, 3 times per week.  He carries the diagnosis of PVD/claudication but this is not the typical symptom pattern.  Also, he has known hypercholesterolemia. Has been intollerant of statins in the past due to muscle pain.  Never had elevated CPK.  Memory is doing well.  Prostate Cancer appears to be in remission as does lymphoma.  States he had an eye exam several months ago and it was neg for diabetic retinopathy.     Review of Systems     Objective:   Physical Exam Decreased hair on both legs and decreased pedal pulses No skin ulceration or callous.       Assessment & Plan:

## 2013-10-25 NOTE — Patient Instructions (Signed)
I will call with blood work results. Keep playing that [employer]. See me in two months.

## 2013-10-26 ENCOUNTER — Encounter: Payer: Self-pay | Admitting: Family Medicine

## 2013-10-26 MED ORDER — ATORVASTATIN CALCIUM 10 MG PO TABS: 10.0000 mg | ORAL_TABLET | Freq: Every day | ORAL | Status: DC

## 2013-10-26 NOTE — Assessment & Plan Note (Signed)
Well controled on current meds 

## 2013-10-26 NOTE — Assessment & Plan Note (Signed)
Unclear how aggressively to work up since his cramping is now at rest.  Will check for electrolyte changes and not pursue arteriograms at present.

## 2013-10-26 NOTE — Assessment & Plan Note (Signed)
With LDL still elevated will once again try statin.

## 2013-10-26 NOTE — Assessment & Plan Note (Signed)
Check for electrolyte abnormalities.

## 2013-11-29 ENCOUNTER — Other Ambulatory Visit: Payer: Self-pay | Admitting: Family Medicine

## 2014-01-10 ENCOUNTER — Encounter: Payer: Self-pay | Admitting: Family Medicine

## 2014-01-10 ENCOUNTER — Ambulatory Visit (INDEPENDENT_AMBULATORY_CARE_PROVIDER_SITE_OTHER): Payer: Medicare PPO | Admitting: Family Medicine

## 2014-01-10 VITALS — BP 134/85 | HR 62 | Temp 98.1°F | Ht 73.0 in | Wt 202.3 lb

## 2014-01-10 DIAGNOSIS — E119 Type 2 diabetes mellitus without complications: Secondary | ICD-10-CM

## 2014-01-10 DIAGNOSIS — I10 Essential (primary) hypertension: Secondary | ICD-10-CM

## 2014-01-10 DIAGNOSIS — F0150 Vascular dementia without behavioral disturbance: Secondary | ICD-10-CM

## 2014-01-10 DIAGNOSIS — C8330 Diffuse large B-cell lymphoma, unspecified site: Secondary | ICD-10-CM

## 2014-01-10 DIAGNOSIS — Z23 Encounter for immunization: Secondary | ICD-10-CM

## 2014-01-10 DIAGNOSIS — J309 Allergic rhinitis, unspecified: Secondary | ICD-10-CM

## 2014-01-10 DIAGNOSIS — E1159 Type 2 diabetes mellitus with other circulatory complications: Secondary | ICD-10-CM

## 2014-01-10 DIAGNOSIS — C8589 Other specified types of non-Hodgkin lymphoma, extranodal and solid organ sites: Secondary | ICD-10-CM

## 2014-01-10 LAB — POCT GLYCOSYLATED HEMOGLOBIN (HGB A1C): HEMOGLOBIN A1C: 7.4

## 2014-01-10 NOTE — Patient Instructions (Signed)
Your weight and blood pressure are good. I am worried about the cough and I think you need another CT scan of your chest. I will copy Dr. Benay Spice on this note and I want you to be sure to mention the cough to Dr. Benay Spice. Go ahead and take Zyrtec/ceterizine for allergies.  It may help the cough. I still think you need more tests. Diabetes is pretty darn good with a 7.4 A1C.  I think a refresher at the diabetes classes with your wife would be great.

## 2014-01-11 NOTE — Assessment & Plan Note (Signed)
I am concerned cough is progression of his biopsy proven lymphoma.  I believe he needs reimaging.  Will leave to Dr. Benay Spice to order desired test at his upcoming appointment.

## 2014-01-11 NOTE — Assessment & Plan Note (Signed)
Added ceterizine for cough - see lymphoma.

## 2014-01-11 NOTE — Assessment & Plan Note (Signed)
Decent control.  He and wife (who has poorly controled DM will go to DM and nutrition classes.  Last there 5 years ago and wife desires refresher.

## 2014-01-11 NOTE — Assessment & Plan Note (Signed)
Seems worse despite meds.  Will continue to follow.

## 2014-01-11 NOTE — Assessment & Plan Note (Signed)
Good control

## 2014-01-11 NOTE — Progress Notes (Signed)
   Subjective:    Patient ID: Nicholas Olsen, male    DOB: 10-02-1934, 80 y.o.   MRN: [id number]  HPIRecheck diabetes, hypertension and dementia Wt has been nicely controled.  Now in ideal body weight range.   DM A1C=7.4 which is good but not quite at goal BP at goal Dementia seems progressive - he had more trouble carrying on a conversation. Lymphoma- has appointment with Dr. Benay Spice in one week.  Concerningly, has cough x 4 months.  Dry, non productive.  Not associated with SOB or fever.  Slowly getting worse.    Review of Systems     Objective:   Physical ExamLungs clear Cardiac RRR without m or g Abd benign.        Assessment & Plan:

## 2014-01-13 ENCOUNTER — Encounter (HOSPITAL_COMMUNITY): Payer: Self-pay | Admitting: Emergency Medicine

## 2014-01-13 ENCOUNTER — Emergency Department (HOSPITAL_COMMUNITY)
Admission: EM | Admit: 2014-01-13 | Discharge: 2014-01-13 | Disposition: A | Discharge status: Home / Self Care | Payer: No Typology Code available for payment source | Attending: Emergency Medicine | Admitting: Emergency Medicine

## 2014-01-13 ENCOUNTER — Emergency Department (HOSPITAL_COMMUNITY): Payer: No Typology Code available for payment source

## 2014-01-13 DIAGNOSIS — I10 Essential (primary) hypertension: Secondary | ICD-10-CM | POA: Diagnosis not present

## 2014-01-13 DIAGNOSIS — S060X9A Concussion with loss of consciousness of unspecified duration, initial encounter: Secondary | ICD-10-CM | POA: Insufficient documentation

## 2014-01-13 DIAGNOSIS — Y92410 Unspecified street and highway as the place of occurrence of the external cause: Secondary | ICD-10-CM | POA: Diagnosis not present

## 2014-01-13 DIAGNOSIS — F0390 Unspecified dementia without behavioral disturbance: Secondary | ICD-10-CM | POA: Diagnosis not present

## 2014-01-13 DIAGNOSIS — E119 Type 2 diabetes mellitus without complications: Secondary | ICD-10-CM | POA: Diagnosis not present

## 2014-01-13 DIAGNOSIS — Z7982 Long term (current) use of aspirin: Secondary | ICD-10-CM | POA: Diagnosis not present

## 2014-01-13 DIAGNOSIS — Z87891 Personal history of nicotine dependence: Secondary | ICD-10-CM | POA: Insufficient documentation

## 2014-01-13 DIAGNOSIS — Z23 Encounter for immunization: Secondary | ICD-10-CM | POA: Insufficient documentation

## 2014-01-13 DIAGNOSIS — E0590 Thyrotoxicosis, unspecified without thyrotoxic crisis or storm: Secondary | ICD-10-CM | POA: Diagnosis not present

## 2014-01-13 DIAGNOSIS — F29 Unspecified psychosis not due to a substance or known physiological condition: Secondary | ICD-10-CM | POA: Insufficient documentation

## 2014-01-13 DIAGNOSIS — Z8572 Personal history of non-Hodgkin lymphomas: Secondary | ICD-10-CM | POA: Diagnosis not present

## 2014-01-13 DIAGNOSIS — Y9389 Activity, other specified: Secondary | ICD-10-CM | POA: Diagnosis not present

## 2014-01-13 DIAGNOSIS — IMO0002 Reserved for concepts with insufficient information to code with codable children: Secondary | ICD-10-CM | POA: Diagnosis not present

## 2014-01-13 DIAGNOSIS — Z79899 Other long term (current) drug therapy: Secondary | ICD-10-CM | POA: Diagnosis not present

## 2014-01-13 DIAGNOSIS — Z923 Personal history of irradiation: Secondary | ICD-10-CM | POA: Diagnosis not present

## 2014-01-13 DIAGNOSIS — S0990XA Unspecified injury of head, initial encounter: Secondary | ICD-10-CM

## 2014-01-13 DIAGNOSIS — Z9889 Other specified postprocedural states: Secondary | ICD-10-CM | POA: Insufficient documentation

## 2014-01-13 MED ORDER — ACETAMINOPHEN 325 MG PO TABS
650.0000 mg | ORAL_TABLET | Freq: Once | ORAL | Status: AC
  Administered 2014-01-13: 650 mg via ORAL
  Filled 2014-01-13: qty 2

## 2014-01-13 MED ORDER — TETANUS-DIPHTH-ACELL PERTUSSIS 5-2.5-18.5 LF-MCG/0.5 IM SUSP
0.5000 mL | Freq: Once | INTRAMUSCULAR | Status: AC
  Administered 2014-01-13: 0.5 mL via INTRAMUSCULAR
  Filled 2014-01-13: qty 0.5
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
## 2014-01-13 NOTE — ED Notes (Signed)
Ambulated pt in hallway - pt tolerated well.
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
## 2014-01-13 NOTE — ED Notes (Signed)
Pt transported to x-ray and CT scan.
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
## 2014-01-16 ENCOUNTER — Telehealth: Payer: Self-pay | Admitting: Oncology

## 2014-01-16 ENCOUNTER — Ambulatory Visit (HOSPITAL_BASED_OUTPATIENT_CLINIC_OR_DEPARTMENT_OTHER): Payer: Medicare PPO | Admitting: Oncology

## 2014-01-16 VITALS — BP 157/80 | HR 67 | Temp 97.6°F | Resp 18 | Ht 73.0 in | Wt 202.4 lb

## 2014-01-16 DIAGNOSIS — R05 Cough: Secondary | ICD-10-CM

## 2014-01-16 DIAGNOSIS — E119 Type 2 diabetes mellitus without complications: Secondary | ICD-10-CM

## 2014-01-16 DIAGNOSIS — I10 Essential (primary) hypertension: Secondary | ICD-10-CM

## 2014-01-16 DIAGNOSIS — Z8546 Personal history of malignant neoplasm of prostate: Secondary | ICD-10-CM

## 2014-01-16 DIAGNOSIS — C8588 Other specified types of non-Hodgkin lymphoma, lymph nodes of multiple sites: Secondary | ICD-10-CM

## 2014-01-16 DIAGNOSIS — R059 Cough, unspecified: Secondary | ICD-10-CM

## 2014-01-16 DIAGNOSIS — C8330 Diffuse large B-cell lymphoma, unspecified site: Secondary | ICD-10-CM

## 2014-01-16 NOTE — Telephone Encounter (Signed)
gv and printed aptp sched and avs for pt for July.... °
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
## 2014-01-23 ENCOUNTER — Ambulatory Visit (INDEPENDENT_AMBULATORY_CARE_PROVIDER_SITE_OTHER): Payer: Medicare PPO | Admitting: Family Medicine

## 2014-01-23 ENCOUNTER — Encounter: Payer: Self-pay | Admitting: Family Medicine

## 2014-01-23 VITALS — BP 145/84 | HR 64 | Temp 97.8°F | Ht 73.0 in | Wt 202.8 lb

## 2014-01-23 DIAGNOSIS — M5380 Other specified dorsopathies, site unspecified: Secondary | ICD-10-CM

## 2014-01-23 DIAGNOSIS — M62830 Muscle spasm of back: Secondary | ICD-10-CM | POA: Insufficient documentation
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
## 2014-01-23 NOTE — Patient Instructions (Signed)
Thank you for coming in,   Continue taking the Advil. Take it easy for the next week and if that time you are still having muscle pain then I will send you for a referral to physical therapy.     Please feel free to call with any questions or concerns at any time, at [phone]. --Dr. Raeford Razor
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
## 2014-01-30 ENCOUNTER — Ambulatory Visit: Payer: Medicare PPO | Admitting: Family Medicine

## 2014-02-01 ENCOUNTER — Other Ambulatory Visit: Payer: Self-pay | Admitting: Family Medicine

## 2014-02-09 ENCOUNTER — Ambulatory Visit (INDEPENDENT_AMBULATORY_CARE_PROVIDER_SITE_OTHER): Payer: Medicare PPO | Admitting: Family Medicine

## 2014-02-09 ENCOUNTER — Encounter: Payer: Self-pay | Admitting: Family Medicine

## 2014-02-09 VITALS — BP 132/64 | HR 62 | Temp 98.2°F | Ht 73.0 in | Wt 203.0 lb

## 2014-02-09 DIAGNOSIS — M719 Bursopathy, unspecified: Secondary | ICD-10-CM

## 2014-02-09 DIAGNOSIS — M75101 Unspecified rotator cuff tear or rupture of right shoulder, not specified as traumatic: Secondary | ICD-10-CM | POA: Insufficient documentation

## 2014-02-09 DIAGNOSIS — F0150 Vascular dementia without behavioral disturbance: Secondary | ICD-10-CM

## 2014-02-09 DIAGNOSIS — M67919 Unspecified disorder of synovium and tendon, unspecified shoulder: Secondary | ICD-10-CM
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
## 2014-02-09 NOTE — Patient Instructions (Signed)
Google the term rotator cuff syndrome My nurse will set you up for physical therapy Be extra careful while driving.   Depending on how the physical therapy goes, I might also need to give you a cortisone injection in the shoulder.
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
## 2014-02-09 NOTE — Assessment & Plan Note (Signed)
Concerned about driving.  Limit to familiar areas and daytime hours.  No hurrying.

## 2014-02-23 ENCOUNTER — Ambulatory Visit: Payer: Medicare PPO | Admitting: *Deleted

## 2014-03-07 ENCOUNTER — Ambulatory Visit: Payer: Medicare PPO | Admitting: Dietician

## 2014-03-07 ENCOUNTER — Other Ambulatory Visit: Payer: Self-pay | Admitting: Family Medicine

## 2014-04-16 ENCOUNTER — Ambulatory Visit (HOSPITAL_COMMUNITY)
Admission: RE | Admit: 2014-04-16 | Discharge: 2014-04-16 | Disposition: A | Discharge status: Home / Self Care | Payer: Medicare PPO | Source: Ambulatory Visit | Attending: Oncology | Admitting: Oncology

## 2014-04-16 DIAGNOSIS — C8330 Diffuse large B-cell lymphoma, unspecified site: Secondary | ICD-10-CM

## 2014-04-16 DIAGNOSIS — C8589 Other specified types of non-Hodgkin lymphoma, extranodal and solid organ sites: Secondary | ICD-10-CM | POA: Insufficient documentation

## 2014-04-16 DIAGNOSIS — E041 Nontoxic single thyroid nodule: Secondary | ICD-10-CM | POA: Insufficient documentation

## 2014-04-16 DIAGNOSIS — K7689 Other specified diseases of liver: Secondary | ICD-10-CM | POA: Insufficient documentation

## 2014-04-16 DIAGNOSIS — M949 Disorder of cartilage, unspecified: Secondary | ICD-10-CM

## 2014-04-16 DIAGNOSIS — M899 Disorder of bone, unspecified: Secondary | ICD-10-CM | POA: Insufficient documentation

## 2014-04-17 ENCOUNTER — Telehealth: Payer: Self-pay | Admitting: Oncology

## 2014-04-17 ENCOUNTER — Ambulatory Visit (HOSPITAL_BASED_OUTPATIENT_CLINIC_OR_DEPARTMENT_OTHER): Payer: Medicare PPO | Admitting: Oncology

## 2014-04-17 VITALS — BP 141/74 | HR 62 | Temp 98.2°F | Resp 18 | Ht 73.0 in | Wt 197.1 lb

## 2014-04-17 DIAGNOSIS — C8589 Other specified types of non-Hodgkin lymphoma, extranodal and solid organ sites: Secondary | ICD-10-CM

## 2014-04-17 DIAGNOSIS — C8330 Diffuse large B-cell lymphoma, unspecified site: Secondary | ICD-10-CM
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
## 2014-04-17 NOTE — Telephone Encounter (Signed)
gv pt wife appt schedule for oct

## 2014-04-26 ENCOUNTER — Encounter: Payer: Self-pay | Admitting: Podiatrist

## 2014-04-26 ENCOUNTER — Ambulatory Visit (INDEPENDENT_AMBULATORY_CARE_PROVIDER_SITE_OTHER): Payer: Medicare PPO | Admitting: Podiatrist

## 2014-04-26 VITALS — BP 142/86 | HR 69 | Resp 12

## 2014-04-26 DIAGNOSIS — M79609 Pain in unspecified limb: Secondary | ICD-10-CM

## 2014-04-26 DIAGNOSIS — B351 Tinea unguium: Secondary | ICD-10-CM

## 2014-04-26 DIAGNOSIS — M79673 Pain in unspecified foot: Secondary | ICD-10-CM

## 2014-04-26 NOTE — Progress Notes (Signed)
Subjective: Patient presents today for followup of nail care on bilateral feet. He's been living in [place] but comes back to [place] at times and likes for me to perform his foot care. Otherwise states he's doing well  Physical Exam  Vascular exam reveals pulses at 1/4 dp, non palp pt. pulse noted. Multiple varicosities are present. No swelling is noted. Neurological sensation is intact and unchanged. Dermatological examination reveals symptomatic mycotic toenails 1 through 5 left and 1 and 2 on the right foot. Digital nails 3,4 and 5 right have previously been permanently removed. Contracted hammertoe deformities are also present bilaterally.  Assessment & Plan:   Assessment: Symptomatic mycotic toenails, hammertoe deformity bilateral  Plan: Discussed etiology, pathology, conservative vs. Surgical therapies and at this time debridement of symptomatic toenails was recommended. Onychoreduction of symptomatic toenails was performed without iatrogenic incident. Patient was instructed on signs and symptoms of infection and was told to call immediately should any of these arise. Recommended he try lambswool in between the toes for the symptomatic contracture deformity

## 2014-05-04 ENCOUNTER — Ambulatory Visit (INDEPENDENT_AMBULATORY_CARE_PROVIDER_SITE_OTHER): Payer: Medicare PPO | Admitting: Family Medicine

## 2014-05-04 ENCOUNTER — Encounter: Payer: Self-pay | Admitting: Family Medicine

## 2014-05-04 VITALS — BP 138/76 | HR 65 | Temp 98.0°F | Wt 195.0 lb

## 2014-05-04 DIAGNOSIS — E7800 Pure hypercholesterolemia, unspecified: Secondary | ICD-10-CM

## 2014-05-04 DIAGNOSIS — Z8639 Personal history of other endocrine, nutritional and metabolic disease: Secondary | ICD-10-CM

## 2014-05-04 DIAGNOSIS — F0150 Vascular dementia without behavioral disturbance: Secondary | ICD-10-CM

## 2014-05-04 DIAGNOSIS — I10 Essential (primary) hypertension: Secondary | ICD-10-CM

## 2014-05-04 DIAGNOSIS — C61 Malignant neoplasm of prostate: Secondary | ICD-10-CM

## 2014-05-04 DIAGNOSIS — E1159 Type 2 diabetes mellitus with other circulatory complications: Secondary | ICD-10-CM

## 2014-05-04 DIAGNOSIS — C8330 Diffuse large B-cell lymphoma, unspecified site: Secondary | ICD-10-CM

## 2014-05-04 DIAGNOSIS — E039 Hypothyroidism, unspecified: Secondary | ICD-10-CM

## 2014-05-04 DIAGNOSIS — C8589 Other specified types of non-Hodgkin lymphoma, extranodal and solid organ sites: Secondary | ICD-10-CM

## 2014-05-04 LAB — CBC
HCT: 37.8 % — ABNORMAL LOW (ref 39.0–52.0)
HEMOGLOBIN: 13.2 g/dL (ref 13.0–17.0)
MCH: 32 pg (ref 26.0–34.0)
MCHC: 34.9 g/dL (ref 30.0–36.0)
MCV: 91.7 fL (ref 78.0–100.0)
Platelets: 218 10*3/uL (ref 150–400)
RBC: 4.12 MIL/uL — AB (ref 4.22–5.81)
RDW: 15.2 % (ref 11.5–15.5)
WBC: 6.8 10*3/uL (ref 4.0–10.5)

## 2014-05-04 LAB — POCT GLYCOSYLATED HEMOGLOBIN (HGB A1C): Hemoglobin A1C: 8.1

## 2014-05-04 NOTE — Assessment & Plan Note (Signed)
Check PSA. ?

## 2014-05-04 NOTE — Assessment & Plan Note (Signed)
Will refer back to geri assessment clinic.  Specifically, I am concerned about his continued driving.

## 2014-05-04 NOTE — Progress Notes (Signed)
   Subjective:    Patient ID: Nicholas Olsen, male    DOB: 04-29-1934, 80 y.o.   MRN: [id number]  HPI Nicholas Olsen seems to be increasingly demented  (as expected).  He relies more and more on his wife to answer more of his questions.  As best I can tell, he is doing great.  Active, working in the yard.  Still driving (see dementia assessment.)  No accidents.  No falls.  Wt is down in a very healthy range and seems stable.  He (actually his wife) admits to dietary indiscretion with lots of fruits and sweets.      Review of Systems     Objective:   Physical Exam Lungs clear Cardiac RRR without m or g       Assessment & Plan:

## 2014-05-04 NOTE — Assessment & Plan Note (Signed)
Recheck labs.  Good report from Dr. Benay Spice

## 2014-05-04 NOTE — Assessment & Plan Note (Signed)
Control sub optimal.  Will focus on diet.

## 2014-05-04 NOTE — Patient Instructions (Signed)
I will call with lab results. Your diabetes control is not quite as good as it has been.  Be careful about the sweets in you diet.   See me in three months. See Dr. McDiarmid in [facility] at your earliest convenience.  I want his opinion on the safety of driving.   Everything else looks good. Remind me when I call with lab results to send a copy to Dr. Benay Spice.

## 2014-05-04 NOTE — Assessment & Plan Note (Signed)
Good control

## 2014-05-04 NOTE — Assessment & Plan Note (Signed)
Recheck TSH 

## 2014-05-05 LAB — PSA: PSA: 0.07 ng/mL (ref <=4.00)

## 2014-05-05 LAB — COMPREHENSIVE METABOLIC PANEL
ALK PHOS: 85 U/L (ref 39–117)
ALT: 23 U/L (ref 0–53)
AST: 32 U/L (ref 0–37)
Albumin: 4 g/dL (ref 3.5–5.2)
BUN: 15 mg/dL (ref 6–23)
CO2: 27 mEq/L (ref 19–32)
Calcium: 9.6 mg/dL (ref 8.4–10.5)
Chloride: 98 mEq/L (ref 96–112)
Creat: 0.87 mg/dL (ref 0.50–1.35)
Glucose, Bld: 179 mg/dL — ABNORMAL HIGH (ref 70–99)
POTASSIUM: 3.4 meq/L — AB (ref 3.5–5.3)
Sodium: 135 mEq/L (ref 135–145)
Total Bilirubin: 0.5 mg/dL (ref 0.2–1.2)
Total Protein: 7 g/dL (ref 6.0–8.3)

## 2014-05-05 LAB — LIPID PANEL
CHOL/HDL RATIO: 2.8 ratio
CHOLESTEROL: 168 mg/dL (ref 0–200)
HDL: 59 mg/dL (ref >39)
LDL Cholesterol: 81 mg/dL (ref 0–99)
Triglycerides: 140 mg/dL (ref <150)
VLDL: 28 mg/dL (ref 0–40)

## 2014-05-05 LAB — TSH: TSH: 2.945 u[IU]/mL (ref 0.350–4.500)

## 2014-05-05 LAB — VITAMIN D 25 HYDROXY (VIT D DEFICIENCY, FRACTURES): Vit D, 25-Hydroxy: 63 ng/mL (ref 30–89)

## 2014-05-22 ENCOUNTER — Ambulatory Visit: Payer: Commercial Managed Care - HMO

## 2014-06-19 ENCOUNTER — Encounter: Payer: Self-pay | Admitting: Family Medicine

## 2014-06-19 ENCOUNTER — Ambulatory Visit (INDEPENDENT_AMBULATORY_CARE_PROVIDER_SITE_OTHER): Payer: Medicare PPO | Admitting: Family Medicine

## 2014-06-19 VITALS — BP 149/90 | HR 54 | Temp 98.2°F | Wt 193.0 lb

## 2014-06-19 DIAGNOSIS — R4189 Other symptoms and signs involving cognitive functions and awareness: Secondary | ICD-10-CM | POA: Insufficient documentation

## 2014-06-19 DIAGNOSIS — F0150 Vascular dementia without behavioral disturbance: Secondary | ICD-10-CM

## 2014-06-19 DIAGNOSIS — F09 Unspecified mental disorder due to known physiological condition: Secondary | ICD-10-CM

## 2014-06-19 NOTE — Assessment & Plan Note (Signed)
Clearly has cognitive impairment likely due to vascular source.  No clear deficits in ADLs or iADLs to technically meet dementia criteria, however given participation and more evaluation I believe this would become clear. Continue namenda Non pharmacologic strategies discussed as well - regular aerobic exercise and group learning experiences.

## 2014-06-19 NOTE — Progress Notes (Signed)
Subjective:    Patient ID: Nicholas Olsen, male    DOB: 05-23-1934, 80 y.o.   MRN: [id number]  HPI Driving Assessment  Pt presents to geriatric clinic for evaluation of safety in driving.   He Denies any trouble with his memory and denies any problems driving today. He is very defensive about his current difficulties with memory and today's testing.   His wife, outside the room, explains that he is upset about his driving abilities being questioned. She denies any ADL deficits.  In the room together she states that he is very active at home and is very busy around the home doing chores. He is not the type to get bored or sit around.    Has patient had any problems driving?   No  Have others expressed concerns about patient's driving?   His wife and PCP  How much does the patient drive?   Routinely, daily as needed  Does patient think they are a safe driver?  Yes  Driving Relevant PMH   Past Medical History  Diagnosis Date  . Cancer   . Diabetes mellitus   . Diffuse large B cell lymphoma 2012    gastric found on EGD  . Hyperthyroidism     Thyroid radiation  . Radiation 1. 7/00  2. 9/00    1. Finished external beam radiation to prostate  2. Gold seeds in prostate  . History of ETT 02/24/06    OK  . Carotid artery occlusion 2/07    Carotid ultrasound: Rt nl: Lt<50% stenosis  . PSVT (paroxysmal supraventricular tachycardia)   . Hypertension    Stroke / TIA Memory loss Peripheral Neuropathy / loss of feeling in limbs Diabetes   New medications or recent change in doses - None    ROS - Per HPI  Review of Systems     Objective:   Physical Exam  Physical  Visual Acuity:  20/25 OS, 20/20OD, 20/30 OU  Get up and Go test:  12 seconds  Range of Motions Neck:  FUll Shoulder / elbow Simulated Turn:  Full Hand Grasp:  Full Ankle Plantar Flesion:  Full Ankle Dorsiflexion:  Full  Motor Strength (x/5 scale) Right Hand:  5/5 Left Hand:  5/5 Right Elbow:   5/5 Left Elbow:  5/5 Right Shoulder:  5/5 Left Shoulder:  5/5 Right Hip :  5/5  Left Hip:  5/5 Right Knee:  5/5 Left Knee:  5/5 Right Ankle:  5/5 Left Ankle:  5/5  Cognitive Testing [place] today 7/30, limited by agitation  MMSE - Mini Mental State Exam 06/23/2012  Orientation to time 5  Orientation to Place 5  Registration 3  Attention/ Calculation 5  Recall 2  Language- name 2 objects 2  Language- repeat 1  Language- follow 3 step command 3  Language- read & follow direction 1  Write a sentence 1  Copy design 1  Total score 29       Assessment & Plan:  Vascular dementia, uncomplicated Clearly has cognitive impairment likely due to vascular source.  No clear deficits in ADLs or iADLs to technically meet dementia criteria, however given participation and more evaluation I believe this would become clear. Continue namenda Non pharmacologic strategies discussed as well - regular aerobic exercise and group learning experiences.     Cognitive impairment Cognitive impairment from clear deficits on cognitive testing today and relatively intact ADLs Would recommend B12, TSH normal last month, consider RPR and HIV as well.  Recent CT  brain with small vessel disease and previous infarct Recommended driving limitations per the AVS Also recommended [place] driver medical evaluation program, will discuss referral with PCP.  Consider OT driving rehab, however he is unlikely to be receptive to this.  Follow up with PCP as previously planned.

## 2014-06-19 NOTE — Progress Notes (Deleted)
Patient ID: Nicholas Olsen, male   DOB: April 23, 1934, 80 y.o.   MRN: [id number] [facility]:   Patient is accompanied by: wife Primary caregiver: patient History obtained from: Wife and patient Primary Care Provider: Dr. Madison Hickman   History Chief Complaint  Patient presents with  . driving assessment    HPI by problems:      Outpatient Encounter Prescriptions as of 06/19/2014  Medication Sig  . amLODipine (NORVASC) 10 MG tablet Take 10 mg by mouth daily.  Marland Kitchen aspirin 81 MG tablet Take 81 mg by mouth daily.  Marland Kitchen atorvastatin (LIPITOR) 10 MG tablet Take 10 mg by mouth daily.  Marland Kitchen b complex vitamins tablet Take 1 tablet by mouth daily.  . cetirizine (ZYRTEC) 10 MG tablet Take 10 mg by mouth daily as needed for allergies.  . clotrimazole-betamethasone (LOTRISONE) cream APPLY TOPICALLY TWICE A DAY FOR GROIN RASH  . [facility] IRON 325 (65 FE) MG tablet TAKE 1 TABLET BY MOUTH 2 TIMES DAILY.  Marland Kitchen donepezil (ARICEPT) 10 MG tablet Take 10 mg by mouth at bedtime.  . hydrochlorothiazide (HYDRODIURIL) 25 MG tablet Take 12.5 mg by mouth daily.  Marland Kitchen levothyroxine (SYNTHROID, LEVOTHROID) 50 MCG tablet Take 50 mcg by mouth daily before breakfast.  . losartan (COZAAR) 100 MG tablet Take 100 mg by mouth daily.  . Memantine HCl ER (NAMENDA XR) 21 MG CP24 Take 21 mg by mouth daily.  . metFORMIN (GLUCOPHAGE) 1000 MG tablet Take 1,000 mg by mouth 2 (two) times daily with a meal.  . Multiple Vitamins-Minerals (MULTIVITAL) tablet Take 1 tablet by mouth daily.  Marland Kitchen omeprazole (PRILOSEC) 40 MG capsule Take 40 mg by mouth daily.  . propranolol (INDERAL) 20 MG tablet Take 20 mg by mouth 2 (two) times daily.   History Patient Active Problem List   Diagnosis Date Noted  . Rotator cuff syndrome of right shoulder 02/09/2014  . Muscle spasm of back 01/23/2014  . Cramps of lower extremity 10/25/2013  . Skin benign neoplasm 05/19/2013  . Unspecified hypothyroidism 04/07/2013  . Diabetes  02/08/2013  . Benign essential tremor 02/08/2013  . Vascular dementia, uncomplicated 75/07/2584  . Reflux esophagitis 08/20/2011  . Diffuse large B cell lymphoma   . TREMOR, ESSENTIAL 06/14/2009  . OSTEOPENIA 01/04/2009  . ALLERGIC RHINITIS 02/06/2008  . PROSTATE CANCER 12/09/2006  . DIAB W/PERIPH CIRC D/O TYPE II/UNS NOT UNCNTRL 12/09/2006  . HYPERCHOLESTEROLEMIA 12/09/2006  . HYPERTENSION, BENIGN SYSTEMIC 12/09/2006  . TACHYCARDIA, PAROXYSMAL SUPRAVENTRICULAR 12/09/2006  . CLAUDICATION, INTERMITTENT 12/09/2006  . NEPHROLITHIASIS 12/09/2006  . CERVICAL SPINE DISORDER, NOS 12/09/2006   Past Medical History  Diagnosis Date  . Cancer   . Diabetes mellitus   . Diffuse large B cell lymphoma 2012    gastric found on EGD  . Hyperthyroidism     Thyroid radiation  . Radiation 1. 7/00  2. 9/00    1. Finished external beam radiation to prostate  2. Gold seeds in prostate  . History of ETT 02/24/06    OK  . Carotid artery occlusion 2/07    Carotid ultrasound: Rt nl: Lt<50% stenosis  . PSVT (paroxysmal supraventricular tachycardia)   . Hypertension    Past Surgical History  Procedure Laterality Date  . Tests      08/04/06 Idl: 80 direct-08/20/06, ABI: 0.85 mild PAD-02/05/05, barium enema-cardiac cath 1/00 normal-cardiolyte no ischemia, EF=43%-10/26/00, CT-head-CXR-ECG-echo EF=55-65%-02/16/05, thyroid scan-US-abdominal-xray-spine  . Cardiac catheterization  2010    at [facility]. No significant CAD.   Family History  Problem Relation Age of Onset  . Coronary artery disease Other     reports that he quit smoking about 13 years ago. He has quit using smokeless tobacco. He reports that he does not drink alcohol or use illicit drugs.  Basic Activities of Daily Living and Instrumental Activities of Daily Living  [place] ADL/IADLS:109026}  Caregiver Burdens: ***   Falls in the past six months:   {YES NO:22349}  Diet:  {diet types:17450} Supplemental shakes:  {YES NO:22349}  Health Maintenance  reviewed: Immunization History  Administered Date(s) Administered  . Influenza Split 08/07/2011, 06/23/2012  . Influenza Whole 08/08/2007, 07/31/2009, 07/23/2010  . Influenza,inj,Quad PF,36+ Mos 06/28/2013  . Pneumococcal Conjugate-13 01/10/2014  . Pneumococcal Polysaccharide-23 01/10/1998, 06/14/2009  . Td 01/10/1998, 09/21/2008  . Tdap 01/13/2014   Health Maintenance Topics with due status: Overdue     Topic Date Due   OPHTHALMOLOGY EXAM 07/20/2013   INFLUENZA VACCINE 05/12/2014    Diet: {DIET STATUS:21022986} Nutritional supplements: {NUTRITION SUPPLEMENTS:220002}  ROS Denies fevers/chills; denies changes in appetite; denies changes in weight;  Denies changes in vision / hearing / smell / taste; Denies runny nose / ear pain or discharge / sore throat / sinus congestion / cough/w phlegm; Denies chest congestion / wheezing;  Denies chest pain; denies heart beating slower/thumps inside chest; denies racing heart/flutter; Denies dysuria; denies hematuria;  Denies constipation; denies melena/hematochezia; denies diarrhea;  Denies abdominal discomfort/gaseous bloating; denies N/V; denies heart burn;  Denies recent falls/unsteady gait;  Denies unilateral weakness / clumsiness / tingling / numbness; denies tremors;  Denies sadness / anxiety / suicidal tendencies  Vital Signs Weight: 193 lb (87.544 kg) Body mass index is 25.47 kg/(m^2). The CrCl is unknown because both a height and weight (above a minimum accepted value) are required for this calculation. There is no height on file to calculate BSA. Filed Vitals:   06/19/14 1505  BP: 149/90  Pulse: 54  Temp: 98.2 F (36.8 C)  TempSrc: Oral  Weight: 193 lb (87.544 kg)   Wt Readings from Last 3 Encounters:  06/19/14 193 lb (87.544 kg)  05/04/14 195 lb (88.451 kg)  04/17/14 197 lb 1.6 oz (89.404 kg)    Physical Examination:  VS reviewed  GEN: Alert, agitated with cognitive eval, Groomed, NAD HEENT: PERRL; EAC bilaterally  not occluded, TM's translucent with normal LM, (+) LR;                No cervical LAN, No thyromegaly, No palpable masses COR: RRR, No M/G/R, No JVD, Normal PMI size and location LUNGS: BCTA, No Acc mm use, speaking in full sentences ABDOMEN: (+)BS, soft, NT, ND, No HSM, No palpable masses GU: Normal Rectal tone, no palpable masses, prostate without hypertrophy/asymmetry/nodularity. Hemoccult negative. EXT: No peripheral leg edema. Feet without deformity or lesions. Palpable bilateral pedal pulses.  SKIN: No lesion nor rashes of face/trunk/extremities Neuro: Oriented to person, place, and time; Strength: 5/5 Bil. UE and LE symmetric; Sensation: Intact grossly to touch all four extremities; Cerebellar: Finger-to-Nose intact, Rhomberg negative; Muscle Tone normal; Tremor not present  Timed Up & Go Test: *** seconds Sit-to-Stand Test: *** / 30-seconds 4-Stage Stand Test:  Feet Side-by-side: {yes no:314532}     Feet Semi-tandem: {yes no:314532}     Feet Tandem: {yes no:314532}     One-Foot Stand:  {yes no:314532}  DTR: Bilateral Bicep 2+, Bilateral Triceps 2+, Bilateral Knees 2+, Bilateral Ankles 1+ Gait: No significant path deviation, Step-through present  Psych: Normal affect/thought/speech/language   Mini-Mental State Examination: Total  Score: MMSE - Mini Mental State Exam 06/23/2012  Orientation to time 5  Orientation to Place 5  Registration 3  Attention/ Calculation 5  Recall 2  Language- name 2 objects 2  Language- repeat 1  Language- follow 3 step command 3  Language- read & follow direction 1  Write a sentence 1  Copy design 1  Total score 29       Geriatric Depression Scale:  ***   Labs  No results found for this basename: VITAMINB12    No results found for this basename: FOLATE    Lab Results  Component Value Date   TSH 2.945 05/04/2014    No results found for this basename: RPR      Chemistry      Component Value Date/Time   NA 135 05/04/2014 1134   NA  138 03/16/2013 1449   K 3.4* 05/04/2014 1134   K 3.6 03/16/2013 1449   CL 98 05/04/2014 1134   CL 101 03/16/2013 1449   CO2 27 05/04/2014 1134   CO2 28 03/16/2013 1449   BUN 15 05/04/2014 1134   BUN 14.1 03/16/2013 1449   CREATININE 0.87 05/04/2014 1134   CREATININE 1.0 03/16/2013 1449   CREATININE 0.74 07/26/2012 1038      Component Value Date/Time   CALCIUM 9.6 05/04/2014 1134   CALCIUM 9.5 03/16/2013 1449   ALKPHOS 85 05/04/2014 1134   ALKPHOS 73 03/16/2013 1449   AST 32 05/04/2014 1134   AST 35* 03/16/2013 1449   ALT 23 05/04/2014 1134   ALT 30 03/16/2013 1449   BILITOT 0.5 05/04/2014 1134   BILITOT 0.52 03/16/2013 1449       Lab Results  Component Value Date   HGBA1C 8.1 05/04/2014      Lab Results  Component Value Date   WBC 6.8 05/04/2014   HGB 13.2 05/04/2014   HCT 37.8* 05/04/2014   MCV 91.7 05/04/2014   PLT 218 05/04/2014    No results found for this or any previous visit (from the past 24 hour(s)).  Assessment and Plan: Problem List Items Addressed This Visit   None       Electronically signed by: @[email or web address] @    Advanced Directives: Code Status:  ***   No flowsheet data found.Contact: First and Last Name if other than the patient involved: *** [phone] (home)    Patient to Follow up with Dr. Marland Kitchen in {NUMBER 7-91:50569} {Time; day/wk/mo/yr(s):9076}

## 2014-06-19 NOTE — Patient Instructions (Signed)
Your physicians have concerns about your safety in driving. We recommend working with the [employer].  They will contact you to discuss road driving testing.  In Meantime, recommend:  Restrict driving to familiar roads near home, avoid driving during busy traffic hours, stay on lower speed roads, and drive only during good weather.  Drive only during Daylight hours.    Speak with your family and friends about alternative means of transportation for you.  Location manager [place] to find out about transportation options available to older citizens.   Telephone [phone].

## 2014-06-19 NOTE — Assessment & Plan Note (Addendum)
Cognitive impairment from clear deficits on cognitive testing today and relatively intact ADLs Would recommend B12, TSH normal last month, consider RPR and HIV as well.  Recent CT brain with small vessel disease and previous infarct Recommended driving limitations per the AVS Also recommended [place] driver medical evaluation program, will discuss referral with PCP.  Consider OT driving rehab, however he is unlikely to be receptive to this.  Follow up with PCP as previously planned.

## 2014-06-20 NOTE — Progress Notes (Signed)
I have seen and examined this patient with Dr Alen Bleacher. I have discussed with Dr Wendi Snipes.  I agree with their findings and plans as documented in their consultation note.  Patient was accompanied by his wife for the interview and exam.  Issues 1. Cognitive Impairment - Pt already on Memantine for Vascular Dementia.  - Pt previously on Donepezil per medication history record starting July 2013 with Dr Walker Kehr. Diagnosis of Vascular Dementia was given at this consultation.  - Pt previously seen in [facility]. Scored 29 out of 30 on MMSE - Head CT prior (01/2013): Prior infarct anterior Limb of Right Internal Capsule.  Pituitary prominence without significant change since 2013 brain MRI.  - TSH (04/2014) = 2.94; Vit D 25(OH) = 63 - Pt did not know why he was being evaluated in Geriatric clinic. - He appeared defensive and agitated when informed that if was to assess the skills necessary for safe driving.  He became less cooperative with the assessment as he frustrated with his inability to perform requested tasks on the [facility] Cognitive Assessment [facility]) test. - His MoCa score was 7 out of 30 in a patient with 11 years of formal education.  He started out being unable to perform the Trail Making B Test, then unable to copy a cube figure, once he was unable to draw a clock, his frustration level appeared to escalate the point that he had difficulty attending to the remaining tasks.  After refusing to perform the Serial 7 test, patient seemed at risk of leaving the office visit, so the test was stopped.  Additionally, patient is hard of hearing.  Testing and exam were conducted using a handheld sound amplifier with ear buds.  - An abbreviated Geriatric Depression Scale did not show evidence of depressed mood or ahedonia.   Questions had to be stopped early as patients appeared agitated by the questions.  - Pt is not dependent in iADLs or ADLs per him and his wife. - Wife denies hallucination and  denies delusions.  - Patient's vision was 20/30 OU - Pt had no limitation in neck, limb, torso range of motion or strength.    Assessment 1.  Dementia - Onset: Since July 2013 - Progression: MMSE 29/30 in 05/2012 to MoCa  >7 out of 30 in 04/2014.  Suspect the recent MoCa score is an underestimate of patient's true cognitive capacity.  - Impaired cognitive domains include visuo-spatial, attention, learning, executive function and  memory.  Language appears well preserved - Suspect Mixed type Dementia (Vascular and Alzheimer's) given multiple cognitive domains involved and known vascular lesion on brain CT.  - Change in ADLs / iADLs: none - Increased irritability.   - No delusions. no hallucinations. - Dementia Medications compliance: taking Nameda daily per patient and wife.  Pt had been on Aricept previously.  - - FAST Stage mild to moderate Recommend - Involvement of wife in  local Alzheimer's [employer] and [employer] of [place] to explore local community resources for activity and possible future need for Adult Daycare.  - Congratulated patient on involvement in both aerobic and resistance training to maintain cognitive skills. - encouraged patient to continue engaging in novel group learning activities as this is associated with preservation of cognitive skills.    2. At-Risk for Unsafe Driving - Assessed Driving Related Skills show deficit in cognition, while his visual and motor and sensory skills do not show overt deficits. This cognitive deficit is a risk factor of unsafe driving.  -  While I would consider referral of patient to a Curator for assessment, Mr Carranco's disposition to become defensive and irritated when his deficits are uncovered, would make assessment and interventions by the Specialist difficult.  - Recommend that Mr Roop be referred to the [facility] Driver Medical Evaluation Program to assess Mr Antonini ability to safely operate a  Teacher, music.  I shared with Mr Trebilcock and his wife that a referral to the Medical Driver Evaluation Program would be made after I discussed case with Dr Andria Frames.    Mr and Mrs Venus did ask questions about the MDE program evaluation process.  Mr Suddreth appeared less agitated and angry by the end of the office visit.  Radiation protection practitioner to learn about transportation options in the community.  - Information given on safe driving with aging to patient and wife.  - Wife identified herself as an alternate source of transportation for her husband should he need it.   This office visit required 40 minutes with over half the time spent in counseling and coordinating patient's care.

## 2014-07-19 ENCOUNTER — Telehealth: Payer: Self-pay | Admitting: Oncology

## 2014-07-19 ENCOUNTER — Ambulatory Visit (HOSPITAL_BASED_OUTPATIENT_CLINIC_OR_DEPARTMENT_OTHER): Payer: Medicare PPO | Admitting: Oncology

## 2014-07-19 ENCOUNTER — Ambulatory Visit: Payer: Medicare PPO | Admitting: Podiatrist

## 2014-07-19 VITALS — BP 154/76 | HR 61 | Temp 98.2°F | Resp 19 | Ht 73.0 in | Wt 196.9 lb

## 2014-07-19 DIAGNOSIS — M899 Disorder of bone, unspecified: Secondary | ICD-10-CM

## 2014-07-19 DIAGNOSIS — I10 Essential (primary) hypertension: Secondary | ICD-10-CM

## 2014-07-19 DIAGNOSIS — C8330 Diffuse large B-cell lymphoma, unspecified site: Secondary | ICD-10-CM

## 2014-07-19 DIAGNOSIS — C8338 Diffuse large B-cell lymphoma, lymph nodes of multiple sites: Secondary | ICD-10-CM

## 2014-07-19 DIAGNOSIS — E119 Type 2 diabetes mellitus without complications: Secondary | ICD-10-CM

## 2014-07-19 DIAGNOSIS — Z8546 Personal history of malignant neoplasm of prostate: Secondary | ICD-10-CM
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
## 2014-07-19 NOTE — Telephone Encounter (Signed)
gv adn printed appt sched and avs for pt fro Jan 2016

## 2014-08-01 ENCOUNTER — Ambulatory Visit: Payer: Medicare PPO | Admitting: Podiatrist

## 2014-08-03 ENCOUNTER — Encounter: Payer: Self-pay | Admitting: Family Medicine

## 2014-08-03 ENCOUNTER — Ambulatory Visit (INDEPENDENT_AMBULATORY_CARE_PROVIDER_SITE_OTHER): Payer: Medicare PPO | Admitting: Family Medicine

## 2014-08-03 VITALS — BP 135/82 | HR 60 | Temp 97.9°F | Ht 73.0 in | Wt 191.0 lb

## 2014-08-03 DIAGNOSIS — Z23 Encounter for immunization: Secondary | ICD-10-CM

## 2014-08-03 DIAGNOSIS — I10 Essential (primary) hypertension: Secondary | ICD-10-CM

## 2014-08-03 DIAGNOSIS — E1151 Type 2 diabetes mellitus with diabetic peripheral angiopathy without gangrene: Secondary | ICD-10-CM

## 2014-08-03 DIAGNOSIS — E1159 Type 2 diabetes mellitus with other circulatory complications: Secondary | ICD-10-CM

## 2014-08-03 DIAGNOSIS — C8330 Diffuse large B-cell lymphoma, unspecified site: Secondary | ICD-10-CM

## 2014-08-03 DIAGNOSIS — F0150 Vascular dementia without behavioral disturbance: Secondary | ICD-10-CM

## 2014-08-03 LAB — CBC
HEMATOCRIT: 37.3 % — AB (ref 39.0–52.0)
Hemoglobin: 12.3 g/dL — ABNORMAL LOW (ref 13.0–17.0)
MCH: 30.8 pg (ref 26.0–34.0)
MCHC: 33 g/dL (ref 30.0–36.0)
MCV: 93.5 fL (ref 78.0–100.0)
PLATELETS: 225 10*3/uL (ref 150–400)
RBC: 3.99 MIL/uL — ABNORMAL LOW (ref 4.22–5.81)
RDW: 15.1 % (ref 11.5–15.5)
WBC: 5.9 10*3/uL (ref 4.0–10.5)

## 2014-08-03 LAB — POCT GLYCOSYLATED HEMOGLOBIN (HGB A1C): Hemoglobin A1C: 7.5

## 2014-08-03 MED ORDER — MEMANTINE HCL 10 MG PO TABS: 10.0000 mg | ORAL_TABLET | Freq: Two times a day (BID) | ORAL | Status: DC

## 2014-08-03 MED ORDER — PROPRANOLOL HCL ER 80 MG PO CP24: 80.0000 mg | ORAL_CAPSULE | Freq: Every day | ORAL | Status: AC

## 2014-08-03 NOTE — Assessment & Plan Note (Signed)
Progressive.  Recommend no driving.

## 2014-08-03 NOTE — Patient Instructions (Addendum)
I will call next week with the paperwork for the drivers license.   I will call with blood count results. You will get a flu shot today.

## 2014-08-03 NOTE — Assessment & Plan Note (Signed)
Good control

## 2014-08-03 NOTE — Progress Notes (Signed)
   Subjective:    Patient ID: Nicholas Olsen, male    DOB: Dec 15, 1933, 80 y.o.   MRN: [id number]  HPI Call [name] with results [phone], OK to leave message. Memory getting worse.  Brought in drivers license forms to get filled out.  I will recommend no driving due to dementia. Weight down.  A1C great. They are in donut hole and want to switch namenda XR to regular namenda Has been taking 1.5 20 mg propranolo TID.  Will switch to once a day propranolol.    Review of Systems     Objective:   Physical Exam Good spirits.  Wt and VS noted. Lungs clear Cardiac RRR without m or g         Assessment & Plan:

## 2014-08-04 LAB — FERRITIN: Ferritin: 76 ng/mL (ref 22–322)

## 2014-08-15 ENCOUNTER — Encounter: Payer: Self-pay | Admitting: Podiatrist

## 2014-08-15 ENCOUNTER — Ambulatory Visit (INDEPENDENT_AMBULATORY_CARE_PROVIDER_SITE_OTHER): Payer: Medicare PPO | Admitting: Podiatrist

## 2014-08-15 DIAGNOSIS — B351 Tinea unguium: Secondary | ICD-10-CM

## 2014-08-15 DIAGNOSIS — M79673 Pain in unspecified foot: Secondary | ICD-10-CM

## 2014-08-15 NOTE — Progress Notes (Signed)
Subjective: Patient presents today for followup of nail care on bilateral feet. He's been living in [place] but comes back to [place] and now to [place] and likes for me to perform his foot care. Otherwise states he's doing well  Physical Exam  Vascular exam reveals pulses at 1/4 dp, non palp pt. pulse noted. Multiple varicosities are present. No swelling is noted. Neurological sensation is intact and unchanged. Dermatological examination reveals symptomatic mycotic toenails 1 through 5 left and 1 and 2 on the right foot. Digital nails 3,4 and 5 right have previously been permanently removed. Contracted hammertoe deformities are also present bilaterally.  Assessment & Plan:   Assessment: Symptomatic mycotic toenails, hammertoe deformity bilateral   Plan:at this time debridement of symptomatic toenails was recommended. Onychoreduction of symptomatic toenails was performed without iatrogenic incident. Patient was instructed on signs and symptoms of infection and was told to call immediately should any of these arise. He became upset at the end of the visit due to the service that was provided.  Apparently he was unhappy however, when asked several times what he was upset about he would not answer or tell me why he was upset.

## 2014-08-21 ENCOUNTER — Telehealth: Payer: Self-pay | Admitting: Family Medicine

## 2014-08-21 NOTE — Telephone Encounter (Signed)
Patient's wife requests to talk Dr. Andria Frames

## 2014-08-21 NOTE — Telephone Encounter (Signed)
Dear Dema Severin Team Dr. Andria Frames will be out until Friday. I can call him if there is something he needs specifically as I am covering for him, but if he wants to [place], he will have to wait THANKS! Dorcas Mcmurray

## 2014-08-21 NOTE — Telephone Encounter (Signed)
Wants to specifically talk to Dr. Andria Frames

## 2014-08-24 NOTE — Telephone Encounter (Signed)
Had questions about not driving.  Answered those questions.

## 2014-08-30 ENCOUNTER — Telehealth: Payer: Self-pay | Admitting: *Deleted

## 2014-08-30 NOTE — Telephone Encounter (Addendum)
Pt states he does not understand the information we are asking for on our phone message, and request a call back to [phone].  I spoke with the pt, and he request diabetic shoes.  I informed pt I would begin the paperwork for authorization for diabetic shoes with Dr. Valentina Lucks and once authorized by his diabetic doctor, Dr. Andria Frames, we would contact him to make an appt to be measured for the diabetic shoes and the beginning process may take a month.  Pt agreed.

## 2014-09-05 NOTE — Telephone Encounter (Signed)
Great-- let me know and I am happy to fill it out.

## 2014-09-10 NOTE — Telephone Encounter (Signed)
Delydia, I believe I left a diabetic shoe form in Dr. Shaune Pollack box in your room for this pt, if not let me know.  Marcy Siren

## 2014-09-27 ENCOUNTER — Telehealth: Payer: Self-pay | Admitting: *Deleted

## 2014-09-27 NOTE — Telephone Encounter (Signed)
Entered in error

## 2014-10-23 ENCOUNTER — Ambulatory Visit (HOSPITAL_COMMUNITY)
Admission: RE | Admit: 2014-10-23 | Discharge: 2014-10-23 | Disposition: A | Discharge status: Home / Self Care | Payer: Medicare PPO | Source: Ambulatory Visit | Attending: Oncology | Admitting: Oncology

## 2014-10-23 DIAGNOSIS — C8330 Diffuse large B-cell lymphoma, unspecified site: Secondary | ICD-10-CM | POA: Diagnosis present

## 2014-10-23 DIAGNOSIS — I2510 Atherosclerotic heart disease of native coronary artery without angina pectoris: Secondary | ICD-10-CM | POA: Insufficient documentation

## 2014-10-23 DIAGNOSIS — D171 Benign lipomatous neoplasm of skin and subcutaneous tissue of trunk: Secondary | ICD-10-CM | POA: Diagnosis not present

## 2014-10-26 ENCOUNTER — Ambulatory Visit: Payer: No Typology Code available for payment source | Admitting: *Deleted

## 2014-10-26 DIAGNOSIS — M79673 Pain in unspecified foot: Secondary | ICD-10-CM

## 2014-10-26 NOTE — Progress Notes (Signed)
HERE TO BE MEASURED AND PICK OUT MY DIABETIC SHOES  NEW BALANCE [address]  [id number] [phone]

## 2014-10-26 NOTE — Patient Instructions (Signed)
YOU WILL RECEIVE A POSTCARD IN THE MAIL WHEN YOUR SHOES ARRIVE TELLING YOU TO SCHEDULE AN APPOINTMENT WITH DR Paulla Dolly

## 2014-10-28 ENCOUNTER — Other Ambulatory Visit: Payer: Self-pay | Admitting: Family Medicine

## 2014-10-28 DIAGNOSIS — E7800 Pure hypercholesterolemia, unspecified: Secondary | ICD-10-CM

## 2014-10-29 ENCOUNTER — Telehealth: Payer: Self-pay | Admitting: Oncology

## 2014-10-29 ENCOUNTER — Ambulatory Visit (HOSPITAL_BASED_OUTPATIENT_CLINIC_OR_DEPARTMENT_OTHER): Payer: Medicare PPO | Admitting: Oncology

## 2014-10-29 ENCOUNTER — Other Ambulatory Visit: Payer: Self-pay | Admitting: Family Medicine

## 2014-10-29 VITALS — BP 147/81 | HR 61 | Temp 98.1°F | Resp 18 | Ht 73.0 in | Wt 194.9 lb

## 2014-10-29 DIAGNOSIS — F0390 Unspecified dementia without behavioral disturbance: Secondary | ICD-10-CM

## 2014-10-29 DIAGNOSIS — C8338 Diffuse large B-cell lymphoma, lymph nodes of multiple sites: Secondary | ICD-10-CM

## 2014-10-29 DIAGNOSIS — C8330 Diffuse large B-cell lymphoma, unspecified site: Secondary | ICD-10-CM

## 2014-10-29 DIAGNOSIS — M899 Disorder of bone, unspecified: Secondary | ICD-10-CM

## 2014-10-29 DIAGNOSIS — Z8546 Personal history of malignant neoplasm of prostate: Secondary | ICD-10-CM

## 2014-10-29 NOTE — Assessment & Plan Note (Signed)
E refill

## 2014-10-29 NOTE — Telephone Encounter (Signed)
Gave avs & cal for Arpil.
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
## 2014-11-09 ENCOUNTER — Other Ambulatory Visit: Payer: Self-pay | Admitting: Family Medicine

## 2014-11-09 ENCOUNTER — Ambulatory Visit (INDEPENDENT_AMBULATORY_CARE_PROVIDER_SITE_OTHER): Payer: Medicare PPO | Admitting: Family Medicine

## 2014-11-09 ENCOUNTER — Encounter: Payer: Self-pay | Admitting: Family Medicine

## 2014-11-09 VITALS — BP 146/87 | HR 63 | Temp 97.7°F | Ht 73.0 in | Wt 197.1 lb

## 2014-11-09 DIAGNOSIS — E7800 Pure hypercholesterolemia, unspecified: Secondary | ICD-10-CM

## 2014-11-09 DIAGNOSIS — E1159 Type 2 diabetes mellitus with other circulatory complications: Secondary | ICD-10-CM

## 2014-11-09 DIAGNOSIS — I10 Essential (primary) hypertension: Secondary | ICD-10-CM

## 2014-11-09 DIAGNOSIS — F0150 Vascular dementia without behavioral disturbance: Secondary | ICD-10-CM

## 2014-11-09 DIAGNOSIS — R252 Cramp and spasm: Secondary | ICD-10-CM

## 2014-11-09 DIAGNOSIS — E1151 Type 2 diabetes mellitus with diabetic peripheral angiopathy without gangrene: Secondary | ICD-10-CM

## 2014-11-09 LAB — RENAL FUNCTION PANEL
Albumin: 3.9 g/dL (ref 3.5–5.2)
BUN: 15 mg/dL (ref 6–23)
CO2: 30 meq/L (ref 19–32)
Calcium: 10.2 mg/dL (ref 8.4–10.5)
Chloride: 97 mEq/L (ref 96–112)
Creat: 0.89 mg/dL (ref 0.50–1.35)
GLUCOSE: 114 mg/dL — AB (ref 70–99)
PHOSPHORUS: 3.9 mg/dL (ref 2.3–4.6)
Potassium: 3.7 mEq/L (ref 3.5–5.3)
SODIUM: 138 meq/L (ref 135–145)

## 2014-11-09 LAB — POCT GLYCOSYLATED HEMOGLOBIN (HGB A1C): Hemoglobin A1C: 6.9

## 2014-11-09 MED ORDER — HYDROCHLOROTHIAZIDE 12.5 MG PO TABS: 12.5000 mg | ORAL_TABLET | Freq: Every day | ORAL | Status: DC

## 2014-11-09 MED ORDER — CLOTRIMAZOLE-BETAMETHASONE 1-0.05 % EX CREA: TOPICAL_CREAM | CUTANEOUS | Status: DC

## 2014-11-09 NOTE — Assessment & Plan Note (Signed)
Lipid panel from [place] fine.  In past has had myalgias from statins.  May need trial off lipitor for his muscle cramping

## 2014-11-09 NOTE — Patient Instructions (Signed)
You look good and I am glad you are feeling well The diabetes and high blood pressure are in great shape. Please make sure I get a copy of his recent eye exam I will fax an updated med list to the [place]. Take the zyrtec every day for the cough. You may have a decision to make about the lipitor/atorvastatin.  If the labs are normal we will probably try a few weeks off to see if that improves his cramping. I will call with the lab tests.   Plan to see me once every three months for quick reviews like today.

## 2014-11-09 NOTE — Assessment & Plan Note (Signed)
Good control and wt stable.

## 2014-11-09 NOTE — Assessment & Plan Note (Signed)
At goal.  

## 2014-11-09 NOTE — Progress Notes (Signed)
   Subjective:    Patient ID: Nicholas Olsen, male    DOB: 07/16/1934, 81 y.o.   MRN: [id number]  HPI  Feels well.  Memory continues to deteriorate.  Appetite good.  Recent labs from  [place] are fine.    A1C and BP are at goal  Does still have muscle cramping.  Review of Systems     Objective:   Physical Exam Lungs clear Cardiac RRR without m or g       Assessment & Plan:

## 2014-11-09 NOTE — Assessment & Plan Note (Signed)
Progressive

## 2014-11-09 NOTE — Assessment & Plan Note (Signed)
Check K and Mg.  May need trial off statin.

## 2014-11-12 LAB — MAGNESIUM: Magnesium: 1.1 mg/dL — ABNORMAL LOW (ref 1.5–2.5)

## 2014-11-12 MED ORDER — MAGNESIUM OXIDE 400 MG PO TABS: 400.0000 mg | ORAL_TABLET | Freq: Every day | ORAL | Status: DC

## 2014-11-12 NOTE — Progress Notes (Signed)
Called and LM that Mag was low, that I would send in Rx and keep everything else the same.

## 2014-11-12 NOTE — Addendum Note (Signed)
Addended by: Zenia Resides on: 11/12/2014 04:44 PM   Modules accepted: Orders

## 2014-11-15 ENCOUNTER — Ambulatory Visit (INDEPENDENT_AMBULATORY_CARE_PROVIDER_SITE_OTHER): Payer: Medicare PPO | Admitting: Podiatrist

## 2014-11-15 ENCOUNTER — Encounter: Payer: Self-pay | Admitting: Podiatrist

## 2014-11-15 DIAGNOSIS — E1151 Type 2 diabetes mellitus with diabetic peripheral angiopathy without gangrene: Secondary | ICD-10-CM

## 2014-11-15 DIAGNOSIS — M79676 Pain in unspecified toe(s): Secondary | ICD-10-CM

## 2014-11-15 DIAGNOSIS — E1159 Type 2 diabetes mellitus with other circulatory complications: Secondary | ICD-10-CM

## 2014-11-15 DIAGNOSIS — B351 Tinea unguium: Secondary | ICD-10-CM

## 2014-11-15 NOTE — Progress Notes (Signed)
Subjective: Patient presents today for followup of nail care on bilateral feet. He's been living in [place] but comes back to [place] and now to [place] and likes for me to perform his foot care. Otherwise states he's doing well  Physical Exam  Vascular exam reveals pulses at 1/4 dp, non palp pt. pulse noted. Multiple varicosities are present. No swelling is noted. Neurological sensation is intact and unchanged. Dermatological examination reveals symptomatic mycotic toenails 1 through 5 left and 1 and 2 on the right foot. Digital nails 3,4 and 5 right have previously been permanently removed. Contracted hammertoe deformities are also present bilaterally.  Assessment & Plan:   Assessment: Symptomatic mycotic toenails, hammertoe deformity bilateral , diabetes with decrased pulses noted.  Plan:at this time debridement of symptomatic toenails was recommended. Onychoreduction of symptomatic toenails was performed without iatrogenic incident. Patient was instructed on signs and symptoms of infection and was told to call immediately should any of these arise.    Patient also presents today to pick up diabetic shoes.  Relates the shoes and inserts feel good and they are free of defect.  No other concerns present at today's visit  Shoes are dispensed and patient given instructions on wear and use of the diabetic inserts.   We will reappoint for continued care and recheck of the shoes in 2-3 months.  If any questions, concerns or problems arise, patient to call.

## 2014-11-15 NOTE — Patient Instructions (Addendum)

## 2014-11-22 ENCOUNTER — Telehealth: Payer: Self-pay | Admitting: Oncology

## 2014-11-22 NOTE — Telephone Encounter (Signed)
Lft msg for pt confirming MD visit r/s to earlier due to provider schedule... KJ mailed sch to pt

## 2015-01-24 ENCOUNTER — Ambulatory Visit (INDEPENDENT_AMBULATORY_CARE_PROVIDER_SITE_OTHER): Payer: Medicare PPO | Admitting: Podiatry

## 2015-01-24 ENCOUNTER — Encounter: Payer: Self-pay | Admitting: Podiatrist

## 2015-01-24 ENCOUNTER — Telehealth: Payer: Self-pay | Admitting: Oncology

## 2015-01-24 DIAGNOSIS — M79673 Pain in unspecified foot: Secondary | ICD-10-CM

## 2015-01-24 DIAGNOSIS — M79676 Pain in unspecified toe(s): Secondary | ICD-10-CM

## 2015-01-24 DIAGNOSIS — B351 Tinea unguium: Secondary | ICD-10-CM | POA: Diagnosis not present

## 2015-01-24 DIAGNOSIS — E1151 Type 2 diabetes mellitus with diabetic peripheral angiopathy without gangrene: Secondary | ICD-10-CM

## 2015-01-24 DIAGNOSIS — E1159 Type 2 diabetes mellitus with other circulatory complications: Secondary | ICD-10-CM

## 2015-01-24 NOTE — Telephone Encounter (Signed)
Received PAL request for 4/18 and moved pt appt from 4/18 to 4/20. PAL day is actually 4/19 not 4/18. Appointment moved back to original d/t. No patient contact made re previous change.

## 2015-01-24 NOTE — Progress Notes (Signed)
She presents today with a chief complaint of painful elongated toenails.  Objective: Pulses are palpable bilateral. Nails are thick yellow dystrophic with mycotic painful palpation.  Assessment: Pain in limb secondary to onychomycosis.  Plan: Debridement of nails 1 through 5 bilateral. 

## 2015-01-28 ENCOUNTER — Ambulatory Visit (HOSPITAL_BASED_OUTPATIENT_CLINIC_OR_DEPARTMENT_OTHER): Payer: Medicare PPO | Admitting: Oncology

## 2015-01-28 ENCOUNTER — Ambulatory Visit: Payer: Medicare PPO | Admitting: Oncology

## 2015-01-28 ENCOUNTER — Telehealth: Payer: Self-pay | Admitting: Oncology

## 2015-01-28 VITALS — BP 128/68 | HR 62 | Temp 97.7°F | Resp 18 | Ht 73.0 in | Wt 198.9 lb

## 2015-01-28 DIAGNOSIS — M542 Cervicalgia: Secondary | ICD-10-CM

## 2015-01-28 DIAGNOSIS — C8330 Diffuse large B-cell lymphoma, unspecified site: Secondary | ICD-10-CM | POA: Diagnosis not present
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
## 2015-01-28 NOTE — Telephone Encounter (Signed)
gave and printed appt sched and avs fo rpt for JULY  °

## 2015-01-30 ENCOUNTER — Ambulatory Visit: Payer: Medicare PPO | Admitting: Oncology

## 2015-02-22 ENCOUNTER — Encounter: Payer: Self-pay | Admitting: Family Medicine

## 2015-02-22 ENCOUNTER — Ambulatory Visit (INDEPENDENT_AMBULATORY_CARE_PROVIDER_SITE_OTHER): Payer: Medicare PPO | Admitting: Family Medicine

## 2015-02-22 VITALS — BP 129/79 | HR 70 | Temp 98.2°F | Ht 73.0 in | Wt 194.4 lb

## 2015-02-22 DIAGNOSIS — F0150 Vascular dementia without behavioral disturbance: Secondary | ICD-10-CM

## 2015-02-22 DIAGNOSIS — E1159 Type 2 diabetes mellitus with other circulatory complications: Secondary | ICD-10-CM | POA: Diagnosis not present

## 2015-02-22 DIAGNOSIS — E1151 Type 2 diabetes mellitus with diabetic peripheral angiopathy without gangrene: Secondary | ICD-10-CM

## 2015-02-22 LAB — POCT GLYCOSYLATED HEMOGLOBIN (HGB A1C): HEMOGLOBIN A1C: 7.3

## 2015-02-22 MED ORDER — PAROXETINE HCL 10 MG PO TABS: 10.0000 mg | ORAL_TABLET | Freq: Every day | ORAL | Status: DC

## 2015-02-22 NOTE — Assessment & Plan Note (Addendum)
Add antidepressent for now to see if benefit. Suggested more music.  He is a big time jazz fan.

## 2015-02-22 NOTE — Progress Notes (Signed)
   Subjective:    Patient ID: Nicholas Olsen, male    DOB: 04/24/1934, 81 y.o.   MRN: [id number]  HPI Spoke with Masiah alone - and then with his wife before and after. Jamesen alternates between sadness and irritability with his dementia, which is progressive.  Wife still works and she feels he is safe at home.  Angry about not being able to drive.  Sometimes blames her, sometimes blames me.  No physical complaints.  It is all about the dementia.  Due for A1C    Review of Systems     Objective:   Physical Exam VS and wt noted to be good. Lungs clear Cardiac RRR without m or g         Assessment & Plan:

## 2015-02-22 NOTE — Patient Instructions (Addendum)
I sent in a new medication, Paroxetine, to help you feel better and cope.   Otherwise, your body is in good shape. I am sorry that your brain is letting you down. I am sorry you are struggling with dementia Listen to more music

## 2015-02-22 NOTE — Assessment & Plan Note (Signed)
A1C 7.3

## 2015-03-29 ENCOUNTER — Ambulatory Visit: Payer: Medicare PPO | Admitting: Podiatry

## 2015-04-02 ENCOUNTER — Other Ambulatory Visit: Payer: Self-pay | Admitting: *Deleted

## 2015-04-02 ENCOUNTER — Telehealth: Payer: Self-pay | Admitting: Oncology

## 2015-04-02 NOTE — Progress Notes (Signed)
Received call from [facility] at Dr. Lorie Apley office [facility]) re: pt's abd pain and weight loss; requesting that appt 7/18 with Dr. Benay Spice be moved to a sooner date/time. POF sent to scheduler urgent.

## 2015-04-02 NOTE — Telephone Encounter (Signed)
Received message from [place] at [facility], Dr. Youlanda Mighty office. Per Lattie Haw patient is established with BS and already on schedule for July, however Dr. Collene Mares would like patient seen sooner than July. Voicemail forwarded to triage followed by routed message.

## 2015-04-03 ENCOUNTER — Telehealth: Payer: Self-pay | Admitting: Oncology

## 2015-04-03 NOTE — Telephone Encounter (Signed)
s.w. pt wife and advised on new dt....ok and aware

## 2015-04-10 ENCOUNTER — Encounter: Payer: Self-pay | Admitting: Family Medicine

## 2015-04-10 ENCOUNTER — Ambulatory Visit (INDEPENDENT_AMBULATORY_CARE_PROVIDER_SITE_OTHER): Payer: Medicare PPO | Admitting: Family Medicine

## 2015-04-10 VITALS — BP 128/72 | HR 70 | Temp 98.1°F | Ht 73.0 in | Wt 182.8 lb

## 2015-04-10 DIAGNOSIS — E039 Hypothyroidism, unspecified: Secondary | ICD-10-CM

## 2015-04-10 DIAGNOSIS — F0150 Vascular dementia without behavioral disturbance: Secondary | ICD-10-CM

## 2015-04-10 DIAGNOSIS — E1159 Type 2 diabetes mellitus with other circulatory complications: Secondary | ICD-10-CM

## 2015-04-10 DIAGNOSIS — R634 Abnormal weight loss: Secondary | ICD-10-CM

## 2015-04-10 DIAGNOSIS — B356 Tinea cruris: Secondary | ICD-10-CM

## 2015-04-10 DIAGNOSIS — C8330 Diffuse large B-cell lymphoma, unspecified site: Secondary | ICD-10-CM

## 2015-04-10 DIAGNOSIS — E1151 Type 2 diabetes mellitus with diabetic peripheral angiopathy without gangrene: Secondary | ICD-10-CM

## 2015-04-10 DIAGNOSIS — I10 Essential (primary) hypertension: Secondary | ICD-10-CM

## 2015-04-10 LAB — COMPREHENSIVE METABOLIC PANEL
ALT: 10 U/L (ref 0–53)
AST: 16 U/L (ref 0–37)
Albumin: 3.4 g/dL — ABNORMAL LOW (ref 3.5–5.2)
Alkaline Phosphatase: 59 U/L (ref 39–117)
BUN: 14 mg/dL (ref 6–23)
CHLORIDE: 93 meq/L — AB (ref 96–112)
CO2: 30 meq/L (ref 19–32)
Calcium: 9.7 mg/dL (ref 8.4–10.5)
Creat: 0.75 mg/dL (ref 0.50–1.35)
Glucose, Bld: 104 mg/dL — ABNORMAL HIGH (ref 70–99)
Potassium: 3.8 mEq/L (ref 3.5–5.3)
Sodium: 139 mEq/L (ref 135–145)
Total Bilirubin: 0.4 mg/dL (ref 0.2–1.2)
Total Protein: 6.6 g/dL (ref 6.0–8.3)

## 2015-04-10 LAB — CBC
HEMATOCRIT: 33.3 % — AB (ref 39.0–52.0)
Hemoglobin: 11.2 g/dL — ABNORMAL LOW (ref 13.0–17.0)
MCH: 30.1 pg (ref 26.0–34.0)
MCHC: 33.6 g/dL (ref 30.0–36.0)
MCV: 89.5 fL (ref 78.0–100.0)
MPV: 9.4 fL (ref 8.6–12.4)
PLATELETS: 311 10*3/uL (ref 150–400)
RBC: 3.72 MIL/uL — ABNORMAL LOW (ref 4.22–5.81)
RDW: 15.8 % — ABNORMAL HIGH (ref 11.5–15.5)
WBC: 9.8 10*3/uL (ref 4.0–10.5)

## 2015-04-10 LAB — TSH: TSH: 5.218 u[IU]/mL — ABNORMAL HIGH (ref 0.350–4.500)

## 2015-04-10 LAB — MAGNESIUM: MAGNESIUM: 1.2 mg/dL — AB (ref 1.5–2.5)

## 2015-04-10 MED ORDER — CLOTRIMAZOLE-BETAMETHASONE 1-0.05 % EX CREA: TOPICAL_CREAM | CUTANEOUS | Status: DC

## 2015-04-10 MED ORDER — MIRTAZAPINE 15 MG PO TABS: 15.0000 mg | ORAL_TABLET | Freq: Every day | ORAL | Status: DC

## 2015-04-10 NOTE — Patient Instructions (Signed)
I refilled the cream for rash Stop the paroxetine/paxil and start the new medicine mirtazapine/remeron.  That hopefully will help your appetite. Stop the HCTZ.  I don't want him to get dehydrated.

## 2015-04-10 NOTE — Assessment & Plan Note (Addendum)
Could be related to wt loss.  Recheck labs.

## 2015-04-10 NOTE — Assessment & Plan Note (Signed)
Refill lotrizone

## 2015-04-11 ENCOUNTER — Ambulatory Visit (HOSPITAL_BASED_OUTPATIENT_CLINIC_OR_DEPARTMENT_OTHER): Payer: Medicare PPO | Admitting: Oncology

## 2015-04-11 ENCOUNTER — Ambulatory Visit (HOSPITAL_COMMUNITY)
Admission: RE | Admit: 2015-04-11 | Discharge: 2015-04-11 | Disposition: A | Discharge status: Home / Self Care | Payer: Medicare PPO | Source: Ambulatory Visit | Attending: Oncology | Admitting: Oncology

## 2015-04-11 ENCOUNTER — Telehealth: Payer: Self-pay | Admitting: Oncology

## 2015-04-11 ENCOUNTER — Other Ambulatory Visit: Payer: Self-pay | Admitting: *Deleted

## 2015-04-11 VITALS — BP 128/64 | HR 69 | Resp 19 | Ht 73.0 in | Wt 183.7 lb

## 2015-04-11 DIAGNOSIS — E237 Disorder of pituitary gland, unspecified: Secondary | ICD-10-CM

## 2015-04-11 DIAGNOSIS — C8330 Diffuse large B-cell lymphoma, unspecified site: Secondary | ICD-10-CM

## 2015-04-11 DIAGNOSIS — C7951 Secondary malignant neoplasm of bone: Secondary | ICD-10-CM | POA: Diagnosis not present

## 2015-04-11 DIAGNOSIS — C8510 Unspecified B-cell lymphoma, unspecified site: Secondary | ICD-10-CM | POA: Insufficient documentation

## 2015-04-11 DIAGNOSIS — Z8546 Personal history of malignant neoplasm of prostate: Secondary | ICD-10-CM

## 2015-04-11 DIAGNOSIS — C8338 Diffuse large B-cell lymphoma, lymph nodes of multiple sites: Secondary | ICD-10-CM

## 2015-04-11 DIAGNOSIS — R918 Other nonspecific abnormal finding of lung field: Secondary | ICD-10-CM | POA: Insufficient documentation

## 2015-04-11 DIAGNOSIS — I10 Essential (primary) hypertension: Secondary | ICD-10-CM

## 2015-04-11 DIAGNOSIS — M899 Disorder of bone, unspecified: Secondary | ICD-10-CM

## 2015-04-11 DIAGNOSIS — C7800 Secondary malignant neoplasm of unspecified lung: Secondary | ICD-10-CM | POA: Diagnosis not present

## 2015-04-11 DIAGNOSIS — F0390 Unspecified dementia without behavioral disturbance: Secondary | ICD-10-CM

## 2015-04-11 DIAGNOSIS — R5383 Other fatigue: Secondary | ICD-10-CM

## 2015-04-11 DIAGNOSIS — E119 Type 2 diabetes mellitus without complications: Secondary | ICD-10-CM

## 2015-04-11 DIAGNOSIS — R4182 Altered mental status, unspecified: Secondary | ICD-10-CM | POA: Diagnosis not present

## 2015-04-11 MED ORDER — GADOBENATE DIMEGLUMINE 529 MG/ML IV SOLN
20.0000 mL | Freq: Once | INTRAVENOUS | Status: AC | PRN
  Administered 2015-04-11: 16 mL via INTRAVENOUS

## 2015-04-11 MED ORDER — ALPRAZOLAM 0.25 MG PO TABS: 0.2500 mg | ORAL_TABLET | Freq: Once | ORAL | Status: DC
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
## 2015-04-11 NOTE — Telephone Encounter (Signed)
Pt confirmed labs/ov per 06/30 POF, gave pt AVS and Calendar... KJ °

## 2015-04-11 NOTE — Assessment & Plan Note (Signed)
Check CBC  Subsequent call from onc/Dr. Benay Spice.  Lymphoma worse.  Asymptomatic or perhaps related to wt loss.  Given dementia, he favors focus on comfort.  I agree.  Have spoken briefly with wife.  Will speak again.

## 2015-04-11 NOTE — Progress Notes (Signed)
   Subjective:    Patient ID: Nicholas Olsen, male    DOB: Sep 09, 1934, 81 y.o.   MRN: [id number]  HPI Nicholas Olsen is not thriving.  1. Main acute issue is weight loss.  This weight loss has been steady over the past few years and I have attributed to dementia.  Wt loss has accelerated over past one month with sig decrease in appetite.  Also not drinking as many fluids.  Accelerated wt loss is temporally related to my starting paxil for depression. 2. Worsening dementia - perhaps compounded by depression.  He has reached the point where he can no longer stay home alone.  Filled out FMLA papers for his wife.  Paxil did seem to improve his mood and psychomotor retardation.  However, it has not improved his irritability. 3. Has FU with onc tomorrow. 4. DM  A1C was 7.3 6 weeks ago.  I am sure it is better now with wt loss.  No apparent hypoglycemia spells.       Review of Systems Denies cough, fever, chest pain, dysuria.     Objective:   Physical Exam Sad appearing and lost.  (Wife provided the history.)   Wt noted and bp. Lungs clear Cardiac 2/6 SEM No edema Groin rash consistent with tinea cruris         Assessment & Plan:

## 2015-04-11 NOTE — Assessment & Plan Note (Signed)
Recheck labs 

## 2015-04-11 NOTE — Assessment & Plan Note (Signed)
Long differential.  Will do screening labs.  FU with onc.   Switch paxil to remeron for appetite stim.

## 2015-04-11 NOTE — Assessment & Plan Note (Signed)
DC HCTZ.  I worry about diuretic and poor fluid intake.

## 2015-04-11 NOTE — Assessment & Plan Note (Signed)
Stable on current meds.  Doubt contributing to his symptoms.

## 2015-04-12 ENCOUNTER — Telehealth: Payer: Self-pay | Admitting: *Deleted

## 2015-04-12 NOTE — Telephone Encounter (Signed)
Per Dr. Benay Spice; informed wife that MRI showed pituitary lesions not changed significantly, no evidence of lymphoma, no acute changes.  Pt's wife verbalized understanding and confirmed appt for 04/23/15.

## 2015-04-12 NOTE — Telephone Encounter (Signed)
-----   Message from Ladell Pier, MD sent at 04/11/2015  9:25 PM EDT ----- Please call wife, pituitary lesions not changed significantly, no evidence of lymphoma, no acute change, f/u as scheduled

## 2015-04-17 ENCOUNTER — Encounter: Payer: Self-pay | Admitting: Skilled Nursing Facility1

## 2015-04-17 NOTE — Progress Notes (Signed)
Subjective:     Patient ID: Nicholas Olsen, male   DOB: 09/26/1934, 81 y.o.   MRN: [id number]  HPI   Review of Systems     Objective:   Physical Exam To assist the pt in identifying dietary strategies to gain some lost wt back.    Assessment:     Pt identified as being malnourished due to some lost wt. Pt contacted via the telephone at [phone]. 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 Pts wife states her husband does not have much of an appetite and some times has trouble eating. Pts wife states he drinks 2 ensures a day and was on an appetite medication that would increase his appetite but he was taken off of it due to adverse affects.     Plan:     Dietitian advised meal times to be scheduled everyday at the same time and be as stress free and comfortable as possible, add cheese/heavy cream to items such as soup and mashed potatoes, and make his ensure drinks into smoothies/milkshakes. Dietitian left a phone number to be contacted if there were any other questions, [phone].

## 2015-04-18 ENCOUNTER — Telehealth: Payer: Self-pay | Admitting: Family Medicine

## 2015-04-18 MED ORDER — PAROXETINE HCL 10 MG PO TABS: 10.0000 mg | ORAL_TABLET | Freq: Every day | ORAL | Status: DC

## 2015-04-18 NOTE — Telephone Encounter (Signed)
[name] called and would like to speak to Dr. Andria Frames concerning her husbands newest medication and making sure she was doing this correctly. jw

## 2015-04-23 ENCOUNTER — Ambulatory Visit (HOSPITAL_BASED_OUTPATIENT_CLINIC_OR_DEPARTMENT_OTHER): Payer: Medicare PPO | Admitting: Nurse Practitioner

## 2015-04-23 ENCOUNTER — Telehealth: Payer: Self-pay | Admitting: Oncology

## 2015-04-23 VITALS — BP 128/69 | HR 68 | Temp 98.5°F | Resp 18 | Ht 74.0 in | Wt 181.8 lb

## 2015-04-23 DIAGNOSIS — I10 Essential (primary) hypertension: Secondary | ICD-10-CM | POA: Diagnosis not present

## 2015-04-23 DIAGNOSIS — F0280 Dementia in other diseases classified elsewhere without behavioral disturbance: Secondary | ICD-10-CM

## 2015-04-23 DIAGNOSIS — C8338 Diffuse large B-cell lymphoma, lymph nodes of multiple sites: Secondary | ICD-10-CM

## 2015-04-23 DIAGNOSIS — E119 Type 2 diabetes mellitus without complications: Secondary | ICD-10-CM | POA: Diagnosis not present

## 2015-04-23 DIAGNOSIS — C8330 Diffuse large B-cell lymphoma, unspecified site: Secondary | ICD-10-CM

## 2015-04-23 DIAGNOSIS — Z8546 Personal history of malignant neoplasm of prostate: Secondary | ICD-10-CM

## 2015-04-23 NOTE — Telephone Encounter (Signed)
Gave adn printed appt sched and avs for pt for OCT °
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
## 2015-04-29 ENCOUNTER — Ambulatory Visit: Payer: Medicare PPO | Admitting: Oncology

## 2015-05-01 ENCOUNTER — Encounter: Payer: Self-pay | Admitting: Family Medicine

## 2015-05-01 ENCOUNTER — Ambulatory Visit (INDEPENDENT_AMBULATORY_CARE_PROVIDER_SITE_OTHER): Payer: Medicare PPO | Admitting: Neurology

## 2015-05-01 ENCOUNTER — Encounter: Payer: Self-pay | Admitting: Neurology

## 2015-05-01 ENCOUNTER — Ambulatory Visit (INDEPENDENT_AMBULATORY_CARE_PROVIDER_SITE_OTHER): Payer: Medicare PPO | Admitting: Family Medicine

## 2015-05-01 VITALS — BP 124/79 | HR 79 | Temp 97.7°F | Ht 74.0 in | Wt 176.0 lb

## 2015-05-01 VITALS — BP 122/70 | HR 82 | Temp 98.6°F | Ht 74.0 in | Wt 175.0 lb

## 2015-05-01 DIAGNOSIS — R634 Abnormal weight loss: Secondary | ICD-10-CM

## 2015-05-01 DIAGNOSIS — E1159 Type 2 diabetes mellitus with other circulatory complications: Secondary | ICD-10-CM

## 2015-05-01 DIAGNOSIS — F0390 Unspecified dementia without behavioral disturbance: Secondary | ICD-10-CM

## 2015-05-01 DIAGNOSIS — I10 Essential (primary) hypertension: Secondary | ICD-10-CM

## 2015-05-01 DIAGNOSIS — I471 Supraventricular tachycardia: Secondary | ICD-10-CM

## 2015-05-01 DIAGNOSIS — E1151 Type 2 diabetes mellitus with diabetic peripheral angiopathy without gangrene: Secondary | ICD-10-CM

## 2015-05-01 DIAGNOSIS — F0150 Vascular dementia without behavioral disturbance: Secondary | ICD-10-CM

## 2015-05-01 DIAGNOSIS — G250 Essential tremor: Secondary | ICD-10-CM | POA: Diagnosis not present

## 2015-05-01 MED ORDER — MEMANTINE HCL-DONEPEZIL HCL ER 28-10 MG PO CP24: 1.0000 | ORAL_CAPSULE | Freq: Every day | ORAL | Status: DC

## 2015-05-01 NOTE — Assessment & Plan Note (Signed)
May worsen in our trial off propranolol

## 2015-05-01 NOTE — Progress Notes (Signed)
[facility]    Provider:  Dr Jaynee Eagles Referring Provider: Zenia Resides, MD Primary Care Physician:  Zigmund Gottron, MD  CC:  dementia  HPI:  Nicholas Olsen is a 81 y.o. male here as a referral from Dr. Andria Frames for [place]. Wife is here with him and provides all the information. Changes started in 2011. Changes included memory loss and confusion. It started with more recent memory loss. He can tell his wife what clothes she had on 48 years ago, about his military history, small details from the past but forgets recent events. It is more recent memories he is having difficulty with such as forgets what you are doing, where you are going, people he meets, getting confused in the house. They have lived in the house for 32 years and he sometimes has difficulty getting to the rooms. Difficulty processing information, following directions. He is having difficulty understanding what is going on. If he is in a room with a lot of people, he doesn't like it. He used to play his guitar and he can't do it anymore. He used to work in the yard but can't anymore. Becoming less social. She thinks he is having delusions, yesterday he thought someone was drinking his water. It has been slowly progressive and now progressing more quickly. Some days he is more perky, other days he doesn't want to do anything and he doesn't want to eat. He has trouble expressing himself.  He will talk about everything and can speak well. No focal motor weakness. Not falling. A little unsteady. He has depression. He gets frustrated easily. No nighttime events or wandering, he is sleeping well now. No severe behavioral distrurbances but he does get angry wife when she wants him to eat or take medicine. Mother with Alzheimers.    Reviewed notes, labs and imaging from outside physicians, which showed:  MRi of the brain 03/2015 shows Moderate chronic small vessel ischemic disease and cerebral Atrophy. (personally  reviewed images with patient's wife).   Cbc with anemia, cmp unremarkable TSh elevated 5.218  Review of Systems: Patient complains of symptoms per HPI as well as the following symptoms: Hearing loss, memory loss, confusion, tremor. Pertinent negatives per HPI. All others negative.   History   Social History  . Marital Status: Married    Spouse Name: N/A  . Number of Children: N/A  . Years of Education: N/A   Occupational History  . [place] History Main Topics  . Smoking status: Former Smoker -- 0.25 packs/day    Quit date: 10/12/2000  . Smokeless tobacco: Former Systems developer  . Alcohol Use: No  . Drug Use: No  . Sexual Activity: Not on file   Other Topics Concern  . Not on file   Social History Narrative   Very regular exercise.    Family History  Problem Relation Age of Onset  . Coronary artery disease Other     Past Medical History  Diagnosis Date  . Cancer   . Diabetes mellitus   . Diffuse large B cell lymphoma 2012    gastric found on EGD  . Hyperthyroidism     Thyroid radiation  . Radiation 1. 7/00  2. 9/00    1. Finished external beam radiation to prostate  2. Gold seeds in prostate  . History of ETT 02/24/06    OK  . Carotid artery occlusion 2/07    Carotid ultrasound: Rt nl: Lt<50% stenosis  .  PSVT (paroxysmal supraventricular tachycardia)   . Hypertension     Past Surgical History  Procedure Laterality Date  . Tests      08/04/06 Idl: 80 direct-08/20/06, ABI: 0.85 mild PAD-02/05/05, barium enema-cardiac cath 1/00 normal-cardiolyte no ischemia, EF=43%-10/26/00, CT-head-CXR-ECG-echo EF=55-65%-02/16/05, thyroid scan-US-abdominal-xray-spine  . Cardiac catheterization  2010    at [facility]. No significant CAD.    Current Outpatient Prescriptions  Medication Sig Dispense Refill  . ALPRAZolam (XANAX) 0.25 MG tablet Take 1 tablet (0.25 mg total) by mouth once. Take 30 minutes prior to MRI 1 tablet 0  . amLODipine (NORVASC) 10 MG tablet  Take 10 mg by mouth daily.    Marland Kitchen aspirin 81 MG tablet Take 81 mg by mouth daily.    Marland Kitchen atorvastatin (LIPITOR) 10 MG tablet TAKE 1 TABLET (10 MG TOTAL) BY MOUTH DAILY TO LOWER CHOLESTEROL 90 tablet 3  . b complex vitamins tablet Take 1 tablet by mouth daily.    . cetirizine (ZYRTEC) 10 MG tablet Take 10 mg by mouth daily as needed for allergies.    . clotrimazole-betamethasone (LOTRISONE) cream APPLY TOPICALLY TWICE A DAY FOR GROIN RASH 45 g 1  . donepezil (ARICEPT) 10 MG tablet Take 10 mg by mouth at bedtime.    Marland Kitchen levothyroxine (SYNTHROID, LEVOTHROID) 50 MCG tablet Take 50 mcg by mouth daily before breakfast.    . losartan (COZAAR) 100 MG tablet Take 100 mg by mouth daily.    . magnesium oxide (MAG-OX) 400 MG tablet Take 1 tablet (400 mg total) by mouth daily. 90 tablet 3  . memantine (NAMENDA) 10 MG tablet Take 1 tablet (10 mg total) by mouth 2 (two) times daily. 60 tablet 3  . metFORMIN (GLUCOPHAGE) 1000 MG tablet Take 1,000 mg by mouth 2 (two) times daily with a meal.    . Multiple Vitamins-Minerals (MULTIVITAL) tablet Take 1 tablet by mouth daily.    Marland Kitchen PARoxetine (PAXIL) 10 MG tablet Take 1 tablet (10 mg total) by mouth daily.    . propranolol ER (INDERAL LA) 80 MG 24 hr capsule Take 1 capsule (80 mg total) by mouth daily. 90 capsule 3  . ranitidine (ZANTAC) 150 MG tablet Take 150 mg by mouth 2 (two) times daily.    . [DISCONTINUED] primidone (MYSOLINE) 50 MG tablet Take 50 mg by mouth at bedtime.      No current facility-administered medications for this visit.    Allergies as of 05/01/2015 - Review Complete 05/01/2015  Allergen Reaction Noted  . Amoxicillin  05/03/2006  . Erythromycin base Hives 02/09/2014  . Erythromycin ethylsuccinate  05/03/2006  . Ezetimibe  12/20/2009  . Simvastatin  12/20/2009  . Sulfamethoxazole  05/03/2006  . Tetracycline hcl  05/03/2006    Vitals: BP 124/79 mmHg  Pulse 79  Temp(Src) 97.7 F (36.5 C) (Oral)  Ht 6\' 2"  (1.88 m)  Wt 176 lb (79.833 kg)   BMI 22.59 kg/m2 Last Weight:  Wt Readings from Last 1 Encounters:  05/01/15 176 lb (79.833 kg)   Last Height:   Ht Readings from Last 1 Encounters:  05/01/15 6\' 2"  (1.88 m)   Physical exam: Exam: Gen: NAD, not conversant                    CV: RRR, no MRG. No Carotid Bruits. No peripheral edema, warm, nontender Eyes: Conjunctivae clear without exudates or hemorrhage  Neuro: Detailed Neurologic Exam  Speech:    No aphasia or dysarthria Cognition: Could not perform MoCA, became quickly frustrated and  wife asked to terminate    The patient is oriented to person only    recent and remote memory impaired;     language fluent;     Impaired attention, concentration,     fund of knowledge impaired Cranial Nerves:    The pupils are equal, round, and reactive to light. Attempted fundi could not visualize. Visual fields are full to threat. Extraocular movements are intact. Trigeminal sensation is intact and the muscles of mastication are normal. The face is mildly asymmetric due to mild ptosis (chroinic). The palate elevates in the midline. Hearing intact. Voice is normal. Shoulder shrug is normal. The tongue has normal motion without fasciculations.   Coordination:    No dysmetria  Gait:    No ataxia  Motor Observation:    No asymmetry, no atrophy, and no involuntary movements noted. Tone:    paratonia  Posture:    Posture is normal.     Strength:    Strength is V/V in the upper and lower limbs.      Sensation: intact to LT     Reflex Exam:  DTR's:    Paratonia, attempted. Absent achilles.  Toes:    The toes are equivocal bilaterally.   Clonus:    Clonus is absent.    Assessment/Plan:  81 year old male with dementia. Appears to be of the alzheimer's type. Could not perform any of the MoCA, became quickly frustrated. MRI of the brain shows only moderate non-specific white matter changes, vascular etiology could be contributory.   Recommend f/u with pcp for elevated  tsh They are going to follow up with the [facility] for management of dementia and also follow her in our office Had a long discussion of dementia as a neurodegenerative disease and its natural progression. At this time patient has few behavioral disturbances.   Continue Aricept and Memantine. Can consider Namzeric in the future if she likes, once daily dosing.  Needs B12 and RPR.  Needs close follow up with pcp for management of vascular risk factors. Goal ldl < 70.   Sarina Ill, MD  [facility] [address] [place], [place] [address]  Phone [phone] Fax [phone]

## 2015-05-01 NOTE — Assessment & Plan Note (Signed)
Likely due to dementia.  Not critical.  See if trial off metformin helps.  If not, will add megace.

## 2015-05-01 NOTE — Assessment & Plan Note (Signed)
See if mentation improves off propranolol

## 2015-05-01 NOTE — Assessment & Plan Note (Signed)
This problem has not bothered him in years.  I think a trial off propranolol to see if it improves mentation is warrented.

## 2015-05-01 NOTE — Progress Notes (Signed)
   Subjective:    Patient ID: Nicholas Olsen, male    DOB: December 11, 1933, 81 y.o.   MRN: [id number]  HPI Sadly, Levar is not thriving. 1. Dementia is getting worse.  He and his wife are fussing more.  Remeron did not work - even at half dose he was too sleepy.  Back on paxil 2. Wt loss continues.  Likely related to #1.  He is still in the ideal range.  Worry that he will have limited reserve.  It will help other issues. 3. HBP great control.  I worry that the propranolol, which helped his tremor, may add to his mental clouding.  Perhaps overtreated at this point. 4. DM- had good control, likely even better with his weight loss.  I worry that his metformin may contribute to hs decreased appetite.        Review of Systems     Objective:   Physical ExamLungs clear Cardiac RRR Neuro - Leighton looks increasingly lost.        Assessment & Plan:

## 2015-05-01 NOTE — Assessment & Plan Note (Signed)
Will try off metformin since he has had great weight loss.  I hope his appetite will improve off metformin.

## 2015-05-01 NOTE — Patient Instructions (Signed)
Let's do one thing at a time. This week, stop the metformin.  I hope that helps improve his appetite. Next week, stop the propranolol. See if he gets mentally sharper.  Maybe the tremor will come back. Call me in two weeks.  We will decide what to do next.  For now, do not take the Megace 40.

## 2015-05-01 NOTE — Assessment & Plan Note (Signed)
Great control with weight loss.  Trial off propranolol

## 2015-05-01 NOTE — Patient Instructions (Signed)
Remember to drink plenty of fluid, eat healthy meals and do not skip any meals. Try to eat protein with a every meal and eat a healthy snack such as fruit or nuts in between meals. Try to keep a regular sleep-wake schedule and try to exercise daily, particularly in the form of walking, 20-30 minutes a day, if you can.   As far as your medications are concerned, I would like to suggest: continue current medications  I would like to see you back in 6 months, sooner if we need to. Please call us with any interim questions, concerns, problems, updates or refill requests.   Please also call us for any test results so we can go over those with you on the phone.  My clinical assistant and will answer any of your questions and relay your messages to me and also relay most of my messages to you.   Our phone number is [phone]. We also have an after hours call service for urgent matters and there is a physician on-call for urgent questions. For any emergencies you know to call 911 or go to the nearest emergency room   

## 2015-05-05 ENCOUNTER — Telehealth: Payer: Self-pay | Admitting: Neurology

## 2015-05-05 NOTE — Telephone Encounter (Signed)
Nicholas Olsen - after reading through the records I did not see a a B12 lab. Would you call and let patient's wife know. I have ordered B12 but they live in [place] and I think it is 30 miniutes away. They can have labs done here or I recommend following up at the [place] or at the pcp and asking for a B12 lab,  if coming to the office here is too far away. He should really have his B12 tested if he has not already. Deficiency can be a cause of dementia.   Also, this thyroid was elevated in June. This was done by Dr. Amalia Hailey and they should follow about that, hypothyroidism can affect memory as well.   Thanks.

## 2015-05-06 ENCOUNTER — Other Ambulatory Visit (INDEPENDENT_AMBULATORY_CARE_PROVIDER_SITE_OTHER): Payer: Self-pay

## 2015-05-06 DIAGNOSIS — Z0289 Encounter for other administrative examinations: Secondary | ICD-10-CM

## 2015-05-06 DIAGNOSIS — F0390 Unspecified dementia without behavioral disturbance: Secondary | ICD-10-CM

## 2015-05-06 NOTE — Telephone Encounter (Signed)
Spoke w/ pt wife, [name] (Listed on [place]) and told her Dr. Jaynee Eagles would like her husband to get B12 labs done. Order placed. She can have him come to our office or [facility]. She Told her he also needed to f/u on elevated thyroid from back in June by Dr. Amalia Hailey because both can affect memory. She verbalized understanding and said she will f/u on this right away.

## 2015-05-07 LAB — B12 AND FOLATE PANEL
Folate: 12.7 ng/mL (ref >3.0)
Vitamin B-12: 954 pg/mL — ABNORMAL HIGH (ref 211–946)

## 2015-05-07 LAB — RPR: RPR Ser Ql: NONREACTIVE

## 2015-05-08 ENCOUNTER — Ambulatory Visit: Payer: Medicare PPO | Admitting: Podiatry

## 2015-05-08 NOTE — Telephone Encounter (Signed)
Left detailed message for pt that his labs were normal and told to call back if he had any questions. Gave [facility] phone number and office hours.

## 2015-05-14 ENCOUNTER — Telehealth: Payer: Self-pay | Admitting: *Deleted

## 2015-05-14 ENCOUNTER — Ambulatory Visit (HOSPITAL_BASED_OUTPATIENT_CLINIC_OR_DEPARTMENT_OTHER): Payer: Medicare PPO

## 2015-05-14 ENCOUNTER — Telehealth: Payer: Self-pay | Admitting: Nurse Practitioner

## 2015-05-14 ENCOUNTER — Ambulatory Visit (HOSPITAL_BASED_OUTPATIENT_CLINIC_OR_DEPARTMENT_OTHER): Payer: Medicare PPO | Admitting: Nurse Practitioner

## 2015-05-14 ENCOUNTER — Other Ambulatory Visit: Payer: Self-pay | Admitting: *Deleted

## 2015-05-14 ENCOUNTER — Telehealth: Payer: Self-pay | Admitting: Oncology

## 2015-05-14 VITALS — BP 132/71 | HR 94 | Temp 98.0°F | Resp 18 | Ht 74.0 in | Wt 174.4 lb

## 2015-05-14 DIAGNOSIS — E119 Type 2 diabetes mellitus without complications: Secondary | ICD-10-CM | POA: Diagnosis not present

## 2015-05-14 DIAGNOSIS — C8330 Diffuse large B-cell lymphoma, unspecified site: Secondary | ICD-10-CM

## 2015-05-14 DIAGNOSIS — R109 Unspecified abdominal pain: Secondary | ICD-10-CM

## 2015-05-14 DIAGNOSIS — C8338 Diffuse large B-cell lymphoma, lymph nodes of multiple sites: Secondary | ICD-10-CM

## 2015-05-14 DIAGNOSIS — Z8546 Personal history of malignant neoplasm of prostate: Secondary | ICD-10-CM

## 2015-05-14 DIAGNOSIS — I10 Essential (primary) hypertension: Secondary | ICD-10-CM | POA: Diagnosis not present

## 2015-05-14 LAB — COMPREHENSIVE METABOLIC PANEL (CC13)
ALBUMIN: 3.1 g/dL — AB (ref 3.5–5.0)
ALT: 15 U/L (ref 0–55)
AST: 21 U/L (ref 5–34)
Alkaline Phosphatase: 76 U/L (ref 40–150)
Anion Gap: 11 mEq/L (ref 3–11)
BUN: 13 mg/dL (ref 7.0–26.0)
CALCIUM: 10.1 mg/dL (ref 8.4–10.4)
CO2: 28 mEq/L (ref 22–29)
CREATININE: 0.9 mg/dL (ref 0.7–1.3)
Chloride: 102 mEq/L (ref 98–109)
Glucose: 113 mg/dl (ref 70–140)
Potassium: 4 mEq/L (ref 3.5–5.1)
SODIUM: 141 meq/L (ref 136–145)
Total Bilirubin: 0.35 mg/dL (ref 0.20–1.20)
Total Protein: 7.6 g/dL (ref 6.4–8.3)

## 2015-05-14 MED ORDER — TRAMADOL HCL 50 MG PO TABS: 50.0000 mg | ORAL_TABLET | Freq: Three times a day (TID) | ORAL | Status: DC | PRN
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
## 2015-05-14 NOTE — Telephone Encounter (Signed)
Dr. Benay Spice spoke with Dr. Collene Mares. Pt will be worked in to office with Lattie Haw, NP today. Called wife with appointment for 2:45.

## 2015-05-14 NOTE — Telephone Encounter (Signed)
Pt confirmed labs/ov per 08/02 POF, gave pt avs and calendar.... KJ °

## 2015-05-14 NOTE — Telephone Encounter (Signed)
s.w. pt and advised on todays appt.....pt ok and aware °

## 2015-05-16 ENCOUNTER — Other Ambulatory Visit: Payer: Self-pay | Admitting: *Deleted

## 2015-05-16 ENCOUNTER — Ambulatory Visit: Payer: Medicare PPO | Admitting: Podiatry

## 2015-05-16 ENCOUNTER — Telehealth: Payer: Self-pay | Admitting: *Deleted

## 2015-05-16 ENCOUNTER — Other Ambulatory Visit: Payer: Self-pay | Admitting: Nurse Practitioner

## 2015-05-16 DIAGNOSIS — C8330 Diffuse large B-cell lymphoma, unspecified site: Secondary | ICD-10-CM

## 2015-05-16 NOTE — Telephone Encounter (Signed)
Called radiology for CT appointment, was given 8/5 at 11:00. Called Mrs. Golonka with this appointment, she states they do not have oral contrast. She will call to reschedule to a later time to give them time to pick up and drink contrast.

## 2015-05-16 NOTE — Telephone Encounter (Signed)
Wife called back to say order was in, but scan probably won't be done until Wednesday. States he is in a lot of pain in abdomen and requesting order status be changed to "priority"

## 2015-05-16 NOTE — Telephone Encounter (Signed)
"  Called Central Sched[facility] for appointment for scan or MRI if lower part of Callan's stomach.  They say there isn't an order.  Can someone please take care of this because he is in a lot of pain.  Call me at [phone] so we'll know it's done."

## 2015-05-17 ENCOUNTER — Ambulatory Visit (HOSPITAL_COMMUNITY): Payer: Medicare PPO

## 2015-05-17 ENCOUNTER — Ambulatory Visit (HOSPITAL_COMMUNITY)
Admission: RE | Admit: 2015-05-17 | Discharge: 2015-05-17 | Disposition: A | Discharge status: Home / Self Care | Payer: Medicare PPO | Source: Ambulatory Visit | Attending: Nurse Practitioner | Admitting: Nurse Practitioner

## 2015-05-17 DIAGNOSIS — R933 Abnormal findings on diagnostic imaging of other parts of digestive tract: Secondary | ICD-10-CM | POA: Insufficient documentation

## 2015-05-17 DIAGNOSIS — R918 Other nonspecific abnormal finding of lung field: Secondary | ICD-10-CM | POA: Diagnosis not present

## 2015-05-17 DIAGNOSIS — C8330 Diffuse large B-cell lymphoma, unspecified site: Secondary | ICD-10-CM | POA: Insufficient documentation

## 2015-05-17 MED ORDER — IOHEXOL 300 MG/ML  SOLN
100.0000 mL | Freq: Once | INTRAMUSCULAR | Status: AC | PRN
  Administered 2015-05-17: 100 mL via INTRAVENOUS

## 2015-05-17 MED ORDER — IOHEXOL 300 MG/ML  SOLN
25.0000 mL | Freq: Once | INTRAMUSCULAR | Status: AC | PRN
  Administered 2015-05-17: 25 mL via ORAL

## 2015-05-20 ENCOUNTER — Telehealth: Payer: Self-pay | Admitting: *Deleted

## 2015-05-20 NOTE — Telephone Encounter (Signed)
-----   Message from Ladell Pier, MD sent at 05/20/2015  3:34 PM EDT ----- Please call patient, please call wife, some enlargement of node near stomach and thickening of gastric wall. Doubt these findings explain his pain.  Call for persistent pain, schedule appt. With [place] or Dr. Andria Frames next 2 weeks

## 2015-05-20 NOTE — Telephone Encounter (Signed)
Per Dr. Benay Spice; attempted to contact pt's wife X2; left voice message on cell # to call office re: results.

## 2015-05-20 NOTE — Telephone Encounter (Signed)
VM message received from pt's wife inquiring about results of scans done on Friday, 05/17/15

## 2015-05-21 ENCOUNTER — Telehealth: Payer: Self-pay | Admitting: *Deleted

## 2015-05-21 NOTE — Telephone Encounter (Signed)
-----   Message from Ladell Pier, MD sent at 05/20/2015  3:34 PM EDT ----- Please call patient, please call wife, some enlargement of node near stomach and thickening of gastric wall. Doubt these findings explain his pain.  Call for persistent pain, schedule appt. With [place] or Dr. Andria Frames next 2 weeks

## 2015-05-21 NOTE — Telephone Encounter (Signed)
Patient wife called today 05/21/15 and would like for you to call them with the results. Message sent to RN Amy H.

## 2015-05-21 NOTE — Telephone Encounter (Signed)
Per Dr. Benay Spice; notified pt's wife that scan showed some enlargement of node near stomach and thickening of gastric wall; doubt these findings explain his pain.  Call for persistent pain; can schedule appt with [place] or Dr. Andria Frames next 2 week.  Pt's wife verbalized understanding and request to schedule appt with Dr. Benay Spice.  Informed her schedulers will be in contact with date/time.

## 2015-05-22 ENCOUNTER — Telehealth: Payer: Self-pay | Admitting: Oncology

## 2015-05-22 NOTE — Telephone Encounter (Signed)
Called patients wife and she is aware of the appointment

## 2015-05-26 ENCOUNTER — Inpatient Hospital Stay (HOSPITAL_COMMUNITY): Payer: Medicare PPO

## 2015-05-26 ENCOUNTER — Encounter (HOSPITAL_COMMUNITY): Payer: Self-pay

## 2015-05-26 ENCOUNTER — Inpatient Hospital Stay (HOSPITAL_COMMUNITY)
Admission: EM | Admit: 2015-05-26 | Discharge: 2015-06-04 | DRG: 840 | Disposition: A | Discharge status: Skilled Nursing Facility | Payer: Medicare PPO | Attending: Internal Medicine | Admitting: Internal Medicine

## 2015-05-26 DIAGNOSIS — E440 Moderate protein-calorie malnutrition: Secondary | ICD-10-CM | POA: Diagnosis present

## 2015-05-26 DIAGNOSIS — G309 Alzheimer's disease, unspecified: Secondary | ICD-10-CM | POA: Diagnosis present

## 2015-05-26 DIAGNOSIS — R29810 Facial weakness: Secondary | ICD-10-CM | POA: Diagnosis present

## 2015-05-26 DIAGNOSIS — F0150 Vascular dementia without behavioral disturbance: Secondary | ICD-10-CM | POA: Diagnosis present

## 2015-05-26 DIAGNOSIS — F0390 Unspecified dementia without behavioral disturbance: Secondary | ICD-10-CM | POA: Diagnosis not present

## 2015-05-26 DIAGNOSIS — Z66 Do not resuscitate: Secondary | ICD-10-CM | POA: Diagnosis present

## 2015-05-26 DIAGNOSIS — T451X5A Adverse effect of antineoplastic and immunosuppressive drugs, initial encounter: Secondary | ICD-10-CM | POA: Diagnosis present

## 2015-05-26 DIAGNOSIS — Z881 Allergy status to other antibiotic agents status: Secondary | ICD-10-CM | POA: Diagnosis not present

## 2015-05-26 DIAGNOSIS — B356 Tinea cruris: Secondary | ICD-10-CM | POA: Diagnosis present

## 2015-05-26 DIAGNOSIS — I10 Essential (primary) hypertension: Secondary | ICD-10-CM | POA: Diagnosis present

## 2015-05-26 DIAGNOSIS — Z515 Encounter for palliative care: Secondary | ICD-10-CM

## 2015-05-26 DIAGNOSIS — K317 Polyp of stomach and duodenum: Secondary | ICD-10-CM | POA: Diagnosis present

## 2015-05-26 DIAGNOSIS — Z79891 Long term (current) use of opiate analgesic: Secondary | ICD-10-CM

## 2015-05-26 DIAGNOSIS — K5900 Constipation, unspecified: Secondary | ICD-10-CM | POA: Diagnosis present

## 2015-05-26 DIAGNOSIS — L89151 Pressure ulcer of sacral region, stage 1: Secondary | ICD-10-CM | POA: Diagnosis present

## 2015-05-26 DIAGNOSIS — F0391 Unspecified dementia with behavioral disturbance: Secondary | ICD-10-CM | POA: Diagnosis not present

## 2015-05-26 DIAGNOSIS — G250 Essential tremor: Secondary | ICD-10-CM | POA: Diagnosis present

## 2015-05-26 DIAGNOSIS — E039 Hypothyroidism, unspecified: Secondary | ICD-10-CM | POA: Diagnosis present

## 2015-05-26 DIAGNOSIS — K922 Gastrointestinal hemorrhage, unspecified: Secondary | ICD-10-CM | POA: Diagnosis present

## 2015-05-26 DIAGNOSIS — C8590 Non-Hodgkin lymphoma, unspecified, unspecified site: Secondary | ICD-10-CM | POA: Insufficient documentation

## 2015-05-26 DIAGNOSIS — I959 Hypotension, unspecified: Secondary | ICD-10-CM | POA: Diagnosis present

## 2015-05-26 DIAGNOSIS — Z6822 Body mass index (BMI) 22.0-22.9, adult: Secondary | ICD-10-CM | POA: Diagnosis not present

## 2015-05-26 DIAGNOSIS — R55 Syncope and collapse: Secondary | ICD-10-CM | POA: Diagnosis present

## 2015-05-26 DIAGNOSIS — Z882 Allergy status to sulfonamides status: Secondary | ICD-10-CM

## 2015-05-26 DIAGNOSIS — E860 Dehydration: Secondary | ICD-10-CM | POA: Diagnosis present

## 2015-05-26 DIAGNOSIS — Z7189 Other specified counseling: Secondary | ICD-10-CM

## 2015-05-26 DIAGNOSIS — Z888 Allergy status to other drugs, medicaments and biological substances status: Secondary | ICD-10-CM | POA: Diagnosis not present

## 2015-05-26 DIAGNOSIS — E780 Pure hypercholesterolemia: Secondary | ICD-10-CM | POA: Diagnosis present

## 2015-05-26 DIAGNOSIS — M8580 Other specified disorders of bone density and structure, unspecified site: Secondary | ICD-10-CM | POA: Diagnosis present

## 2015-05-26 DIAGNOSIS — R531 Weakness: Secondary | ICD-10-CM | POA: Diagnosis not present

## 2015-05-26 DIAGNOSIS — L8990 Pressure ulcer of unspecified site, unspecified stage: Secondary | ICD-10-CM | POA: Insufficient documentation

## 2015-05-26 DIAGNOSIS — Z7982 Long term (current) use of aspirin: Secondary | ICD-10-CM | POA: Diagnosis not present

## 2015-05-26 DIAGNOSIS — C8330 Diffuse large B-cell lymphoma, unspecified site: Secondary | ICD-10-CM | POA: Diagnosis present

## 2015-05-26 DIAGNOSIS — Z8249 Family history of ischemic heart disease and other diseases of the circulatory system: Secondary | ICD-10-CM | POA: Diagnosis not present

## 2015-05-26 DIAGNOSIS — R451 Restlessness and agitation: Secondary | ICD-10-CM | POA: Diagnosis not present

## 2015-05-26 DIAGNOSIS — K921 Melena: Secondary | ICD-10-CM | POA: Diagnosis present

## 2015-05-26 DIAGNOSIS — F329 Major depressive disorder, single episode, unspecified: Secondary | ICD-10-CM | POA: Diagnosis present

## 2015-05-26 DIAGNOSIS — Z8546 Personal history of malignant neoplasm of prostate: Secondary | ICD-10-CM | POA: Diagnosis not present

## 2015-05-26 DIAGNOSIS — E0590 Thyrotoxicosis, unspecified without thyrotoxic crisis or storm: Secondary | ICD-10-CM | POA: Diagnosis present

## 2015-05-26 DIAGNOSIS — Z880 Allergy status to penicillin: Secondary | ICD-10-CM

## 2015-05-26 DIAGNOSIS — Z87891 Personal history of nicotine dependence: Secondary | ICD-10-CM

## 2015-05-26 DIAGNOSIS — I2510 Atherosclerotic heart disease of native coronary artery without angina pectoris: Secondary | ICD-10-CM | POA: Diagnosis present

## 2015-05-26 DIAGNOSIS — D72829 Elevated white blood cell count, unspecified: Secondary | ICD-10-CM | POA: Diagnosis present

## 2015-05-26 DIAGNOSIS — E785 Hyperlipidemia, unspecified: Secondary | ICD-10-CM | POA: Diagnosis present

## 2015-05-26 DIAGNOSIS — I951 Orthostatic hypotension: Secondary | ICD-10-CM | POA: Diagnosis not present

## 2015-05-26 DIAGNOSIS — Z87442 Personal history of urinary calculi: Secondary | ICD-10-CM

## 2015-05-26 DIAGNOSIS — Z79899 Other long term (current) drug therapy: Secondary | ICD-10-CM | POA: Diagnosis not present

## 2015-05-26 DIAGNOSIS — R911 Solitary pulmonary nodule: Secondary | ICD-10-CM | POA: Diagnosis present

## 2015-05-26 DIAGNOSIS — Z9221 Personal history of antineoplastic chemotherapy: Secondary | ICD-10-CM

## 2015-05-26 DIAGNOSIS — D630 Anemia in neoplastic disease: Secondary | ICD-10-CM | POA: Diagnosis not present

## 2015-05-26 DIAGNOSIS — K254 Chronic or unspecified gastric ulcer with hemorrhage: Secondary | ICD-10-CM | POA: Diagnosis present

## 2015-05-26 DIAGNOSIS — D62 Acute posthemorrhagic anemia: Secondary | ICD-10-CM | POA: Diagnosis present

## 2015-05-26 DIAGNOSIS — L299 Pruritus, unspecified: Secondary | ICD-10-CM | POA: Diagnosis not present

## 2015-05-26 DIAGNOSIS — E876 Hypokalemia: Secondary | ICD-10-CM | POA: Diagnosis present

## 2015-05-26 DIAGNOSIS — D6481 Anemia due to antineoplastic chemotherapy: Secondary | ICD-10-CM | POA: Diagnosis present

## 2015-05-26 DIAGNOSIS — E1151 Type 2 diabetes mellitus with diabetic peripheral angiopathy without gangrene: Secondary | ICD-10-CM | POA: Diagnosis present

## 2015-05-26 LAB — I-STAT CG4 LACTIC ACID, ED
LACTIC ACID, VENOUS: 1.73 mmol/L (ref 0.5–2.0)
LACTIC ACID, VENOUS: 1.1 mmol/L (ref 0.5–2.0)

## 2015-05-26 LAB — APTT: APTT: 29 s (ref 24–37)

## 2015-05-26 LAB — COMPREHENSIVE METABOLIC PANEL
ALBUMIN: 2.7 g/dL — AB (ref 3.5–5.0)
ALT: 11 U/L — AB (ref 17–63)
AST: 19 U/L (ref 15–41)
Alkaline Phosphatase: 49 U/L (ref 38–126)
Anion gap: 8 (ref 5–15)
BUN: 38 mg/dL — ABNORMAL HIGH (ref 6–20)
CALCIUM: 9.3 mg/dL (ref 8.9–10.3)
CHLORIDE: 103 mmol/L (ref 101–111)
CO2: 29 mmol/L (ref 22–32)
CREATININE: 0.97 mg/dL (ref 0.61–1.24)
GFR calc non Af Amer: >60 mL/min (ref >60)
GLUCOSE: 182 mg/dL — AB (ref 65–99)
POTASSIUM: 4.3 mmol/L (ref 3.5–5.1)
SODIUM: 140 mmol/L (ref 135–145)
Total Bilirubin: 0.2 mg/dL — ABNORMAL LOW (ref 0.3–1.2)
Total Protein: 6 g/dL — ABNORMAL LOW (ref 6.5–8.1)

## 2015-05-26 LAB — CBC WITH DIFFERENTIAL/PLATELET
Basophils Absolute: 0 10*3/uL (ref 0.0–0.1)
Basophils Absolute: 0.1 10*3/uL (ref 0.0–0.1)
Basophils Relative: 0 % (ref 0–1)
Basophils Relative: 0 % (ref 0–1)
EOS ABS: 0 10*3/uL (ref 0.0–0.7)
EOS PCT: 0 % (ref 0–5)
Eosinophils Absolute: 0 10*3/uL (ref 0.0–0.7)
Eosinophils Relative: 0 % (ref 0–5)
HEMATOCRIT: 19.7 % — AB (ref 39.0–52.0)
HEMATOCRIT: 24.7 % — AB (ref 39.0–52.0)
HEMOGLOBIN: 8.2 g/dL — AB (ref 13.0–17.0)
Hemoglobin: 6.3 g/dL — CL (ref 13.0–17.0)
LYMPHS ABS: 1.7 10*3/uL (ref 0.7–4.0)
LYMPHS PCT: 11 % — AB (ref 12–46)
LYMPHS PCT: 14 % (ref 12–46)
Lymphs Abs: 2.1 10*3/uL (ref 0.7–4.0)
MCH: 29.6 pg (ref 26.0–34.0)
MCH: 30 pg (ref 26.0–34.0)
MCHC: 32 g/dL (ref 30.0–36.0)
MCHC: 33.2 g/dL (ref 30.0–36.0)
MCV: 92.5 fL (ref 78.0–100.0)
MCV: 90.5 fL (ref 78.0–100.0)
MONO ABS: 0.6 10*3/uL (ref 0.1–1.0)
MONO ABS: 0.7 10*3/uL (ref 0.1–1.0)
Monocytes Relative: 4 % (ref 3–12)
Monocytes Relative: 5 % (ref 3–12)
NEUTROS ABS: 11.7 10*3/uL — AB (ref 1.7–7.7)
NEUTROS PCT: 81 % — AB (ref 43–77)
Neutro Abs: 14 10*3/uL — ABNORMAL HIGH (ref 1.7–7.7)
Neutrophils Relative %: 85 % — ABNORMAL HIGH (ref 43–77)
PLATELETS: 276 10*3/uL (ref 150–400)
Platelets: 309 10*3/uL (ref 150–400)
RBC: 2.13 MIL/uL — AB (ref 4.22–5.81)
RBC: 2.73 MIL/uL — ABNORMAL LOW (ref 4.22–5.81)
RDW: 17.1 % — AB (ref 11.5–15.5)
RDW: 16 % — AB (ref 11.5–15.5)
WBC: 16.4 10*3/uL — AB (ref 4.0–10.5)
WBC: 14.5 10*3/uL — AB (ref 4.0–10.5)

## 2015-05-26 LAB — I-STAT CHEM 8, ED
BUN: 39 mg/dL — ABNORMAL HIGH (ref 6–20)
CALCIUM ION: 1.25 mmol/L (ref 1.13–1.30)
CHLORIDE: 102 mmol/L (ref 101–111)
Creatinine, Ser: 0.9 mg/dL (ref 0.61–1.24)
Glucose, Bld: 178 mg/dL — ABNORMAL HIGH (ref 65–99)
HEMATOCRIT: 22 % — AB (ref 39.0–52.0)
HEMOGLOBIN: 7.5 g/dL — AB (ref 13.0–17.0)
POTASSIUM: 4.3 mmol/L (ref 3.5–5.1)
Sodium: 139 mmol/L (ref 135–145)
TCO2: 28 mmol/L (ref 0–100)

## 2015-05-26 LAB — GLUCOSE, CAPILLARY: GLUCOSE-CAPILLARY: 87 mg/dL (ref 65–99)

## 2015-05-26 LAB — POC OCCULT BLOOD, ED: Fecal Occult Bld: POSITIVE — AB

## 2015-05-26 LAB — PREPARE RBC (CROSSMATCH)

## 2015-05-26 LAB — TROPONIN I

## 2015-05-26 LAB — CBG MONITORING, ED: GLUCOSE-CAPILLARY: 118 mg/dL — AB (ref 65–99)

## 2015-05-26 LAB — PROTIME-INR
INR: 1.16 (ref 0.00–1.49)
PROTHROMBIN TIME: 15 s (ref 11.6–15.2)

## 2015-05-26 LAB — MRSA PCR SCREENING: MRSA by PCR: NEGATIVE

## 2015-05-26 MED ORDER — PANTOPRAZOLE SODIUM 40 MG IV SOLR
40.0000 mg | Freq: Two times a day (BID) | INTRAVENOUS | Status: DC
  Administered 2015-05-30 – 2015-05-31 (×3): 40 mg via INTRAVENOUS
  Filled 2015-05-26 (×4): qty 40

## 2015-05-26 MED ORDER — PAROXETINE HCL 10 MG PO TABS
10.0000 mg | ORAL_TABLET | Freq: Every day | ORAL | Status: DC
  Administered 2015-05-27 – 2015-06-03 (×8): 10 mg via ORAL
  Filled 2015-05-26 (×9): qty 1

## 2015-05-26 MED ORDER — SODIUM CHLORIDE 0.9 % IV SOLN
8.0000 mg/h | INTRAVENOUS | Status: AC
  Administered 2015-05-26 – 2015-05-29 (×4): 8 mg/h via INTRAVENOUS
  Filled 2015-05-26 (×14): qty 80

## 2015-05-26 MED ORDER — ATORVASTATIN CALCIUM 10 MG PO TABS
10.0000 mg | ORAL_TABLET | Freq: Every day | ORAL | Status: DC
  Administered 2015-05-26 – 2015-05-27 (×2): 10 mg via ORAL
  Filled 2015-05-26 (×2): qty 1

## 2015-05-26 MED ORDER — DIPHENHYDRAMINE HCL 50 MG/ML IJ SOLN
25.0000 mg | Freq: Four times a day (QID) | INTRAMUSCULAR | Status: DC | PRN
  Administered 2015-05-27 – 2015-05-28 (×2): 25 mg via INTRAVENOUS
  Filled 2015-05-26 (×2): qty 1

## 2015-05-26 MED ORDER — ONDANSETRON HCL 4 MG PO TABS
4.0000 mg | ORAL_TABLET | Freq: Four times a day (QID) | ORAL | Status: DC | PRN
Start: 2015-05-26 — End: 2015-06-04

## 2015-05-26 MED ORDER — DIPHENHYDRAMINE HCL 50 MG/ML IJ SOLN
25.0000 mg | Freq: Once | INTRAMUSCULAR | Status: AC
  Administered 2015-05-26: 25 mg via INTRAVENOUS
  Filled 2015-05-26: qty 1

## 2015-05-26 MED ORDER — PANTOPRAZOLE SODIUM 40 MG IV SOLR
80.0000 mg | Freq: Once | INTRAVENOUS | Status: AC
  Administered 2015-05-26: 80 mg via INTRAVENOUS
  Filled 2015-05-26: qty 80

## 2015-05-26 MED ORDER — SODIUM CHLORIDE 0.9 % IV SOLN
INTRAVENOUS | Status: AC
Start: 2015-05-26 — End: 2015-05-27
  Administered 2015-05-26: 21:00:00 via INTRAVENOUS

## 2015-05-26 MED ORDER — SODIUM CHLORIDE 0.9 % IJ SOLN
3.0000 mL | Freq: Two times a day (BID) | INTRAMUSCULAR | Status: DC
  Administered 2015-05-26 – 2015-06-04 (×13): 3 mL via INTRAVENOUS

## 2015-05-26 MED ORDER — ACETAMINOPHEN 650 MG RE SUPP: 650.0000 mg | Freq: Four times a day (QID) | RECTAL | Status: DC | PRN

## 2015-05-26 MED ORDER — ONDANSETRON HCL 4 MG/2ML IJ SOLN
4.0000 mg | Freq: Four times a day (QID) | INTRAMUSCULAR | Status: DC | PRN
  Administered 2015-05-27: 4 mg via INTRAVENOUS

## 2015-05-26 MED ORDER — SODIUM CHLORIDE 0.9 % IV SOLN: Freq: Once | INTRAVENOUS | Status: AC

## 2015-05-26 MED ORDER — INSULIN ASPART 100 UNIT/ML ~~LOC~~ SOLN
0.0000 [IU] | SUBCUTANEOUS | Status: DC
  Administered 2015-05-27 – 2015-05-29 (×2): 1 [IU] via SUBCUTANEOUS

## 2015-05-26 MED ORDER — ONDANSETRON HCL 4 MG/2ML IJ SOLN: 4.0000 mg | Freq: Three times a day (TID) | INTRAMUSCULAR | Status: DC | PRN

## 2015-05-26 MED ORDER — MEMANTINE HCL 10 MG PO TABS
10.0000 mg | ORAL_TABLET | Freq: Two times a day (BID) | ORAL | Status: DC
  Administered 2015-05-27 – 2015-05-29 (×6): 10 mg via ORAL
  Filled 2015-05-26 (×9): qty 1

## 2015-05-26 MED ORDER — CLOTRIMAZOLE 1 % EX CREA
TOPICAL_CREAM | Freq: Two times a day (BID) | CUTANEOUS | Status: DC
  Administered 2015-05-26 – 2015-06-03 (×16): via TOPICAL
  Administered 2015-06-03: 1 via TOPICAL
  Filled 2015-05-26 (×2): qty 15

## 2015-05-26 MED ORDER — ACETAMINOPHEN 325 MG PO TABS
650.0000 mg | ORAL_TABLET | Freq: Four times a day (QID) | ORAL | Status: DC | PRN
  Administered 2015-05-31: 650 mg via ORAL
  Filled 2015-05-26: qty 2

## 2015-05-26 MED ORDER — LEVOTHYROXINE SODIUM 50 MCG PO TABS
50.0000 ug | ORAL_TABLET | Freq: Every day | ORAL | Status: DC
  Administered 2015-05-27 – 2015-06-03 (×8): 50 ug via ORAL
  Filled 2015-05-26 (×13): qty 1

## 2015-05-26 MED ORDER — SODIUM CHLORIDE 0.9 % IV SOLN: 10.0000 mL/h | Freq: Once | INTRAVENOUS | Status: DC

## 2015-05-26 NOTE — ED Provider Notes (Signed)
Care discussed with Dr. Rex Kras at [facility] prior to patient's transfer.  Recent seen upon arrival here. Chart reviewed. Patient examined.  He is awake and alert. His conjunctiva appear pale. Systolic pressure 888. Heart rate 70. However patient is chronically on a beta blocker.  Patient is followed at family medicine clinic. I placed a call to the family medicine resident. GI has been consult by Dr. Rex Kras through [facility] prior to the patient's transfer.  I requested CBG, lactate, and repeat CBC.  Tanna Furry, MD 05/26/15 [phone]

## 2015-05-26 NOTE — ED Notes (Signed)
Bed: RESA Expected date: 05/26/15 Expected time: 12:07 PM Means of arrival: Ambulance Comments: GI Bleed, hypotensive, no IV

## 2015-05-26 NOTE — Progress Notes (Signed)
Pt arrived to 2C11 at 2000.  VS are all WNL, pt has no complaints of pain.  Patient is alert to self but disoriented otherwise which his baseline due to Alzheimer's/Dementia. Wife is at the bedside.  Pt is due to receive another unit of PRBC's but the type and screen from [facility] does not carry over. New type and screen is in process and pt requires irradiated blood which comes from [facility].  Blood will be administered when ready.  RN will continue to monitor.

## 2015-05-26 NOTE — ED Provider Notes (Signed)
CSN: [id number]     Arrival date & time 05/26/15  1210 History   First MD Initiated Contact with Patient 05/26/15 1228     Chief Complaint  Patient presents with  . Rectal Bleeding  . Loss of Consciousness     (Consider location/radiation/quality/duration/timing/severity/associated sxs/prior Treatment) HPI Comments:  81 year old male with extensive past medical history including B cell lymphoma, type 2 diabetes mellitus, hypertension, CAD, dementia who presents with melena. History obtained by EMS due to the patient's underlying dementia. EMS reports that they were called to the patient's residence for black stools that wife stated began this morning. The patient was going to the bathroom this morning and they noticed black stools. Shortly afterwards the patient had a syncopal episode while in the bathroom. He did not fall and was assisted by his wife. The patient remained hypertensive with systolics in the 85I during transport. Blood glucose normal. Patient currently denies any complaints. He denies any abdominal pain or, chest pain, or difficulty breathing.    Level V caveat applies.  Patient is a 81 y.o. male presenting with hematochezia and syncope. The history is provided by the EMS personnel. The history is limited by the condition of the patient.  Rectal Bleeding Loss of Consciousness   Past Medical History  Diagnosis Date  . Cancer   . Diabetes mellitus   . Diffuse large B cell lymphoma 2012    gastric found on EGD  . Hyperthyroidism     Thyroid radiation  . Radiation 1. 7/00  2. 9/00    1. Finished external beam radiation to prostate  2. Gold seeds in prostate  . History of ETT 02/24/06    OK  . Carotid artery occlusion 2/07    Carotid ultrasound: Rt nl: Lt<50% stenosis  . PSVT (paroxysmal supraventricular tachycardia)   . Hypertension    Past Surgical History  Procedure Laterality Date  . Tests      08/04/06 Idl: 80 direct-08/20/06, ABI: 0.85 mild PAD-02/05/05, barium  enema-cardiac cath 1/00 normal-cardiolyte no ischemia, EF=43%-10/26/00, CT-head-CXR-ECG-echo EF=55-65%-02/16/05, thyroid scan-US-abdominal-xray-spine  . Cardiac catheterization  2010    at [facility]. No significant CAD.   Family History  Problem Relation Age of Onset  . Coronary artery disease Other    Social History  Substance Use Topics  . Smoking status: Former Smoker -- 0.25 packs/day    Quit date: 10/12/2000  . Smokeless tobacco: Former Systems developer  . Alcohol Use: No    Review of Systems  Unable to perform ROS: Dementia  Cardiovascular: Positive for syncope.  Gastrointestinal: Positive for hematochezia.    Allergies  Amoxicillin; Erythromycin ethylsuccinate; Remeron; Simvastatin; Sulfamethoxazole; and Tetracycline hcl  Home Medications   Prior to Admission medications   Medication Sig Start Date End Date Taking? Authorizing Provider  ALPRAZolam (XANAX) 0.25 MG tablet Take 1 tablet (0.25 mg total) by mouth once. Take 30 minutes prior to MRI 04/11/15  Yes Ladell Pier, MD  amLODipine (NORVASC) 10 MG tablet Take 10 mg by mouth every morning.    Yes Historical Provider, MD  aspirin 81 MG tablet Take 81 mg by mouth every morning.    Yes Historical Provider, MD  b complex vitamins tablet Take 1 tablet by mouth every morning.    Yes Historical Provider, MD  cetirizine (ZYRTEC) 10 MG tablet Take 10 mg by mouth daily as needed for allergies.   Yes Historical Provider, MD  clotrimazole-betamethasone (LOTRISONE) cream APPLY TOPICALLY TWICE A DAY FOR GROIN RASH 04/10/15  Yes Zenia Resides,  MD  levothyroxine (SYNTHROID, LEVOTHROID) 50 MCG tablet Take 50 mcg by mouth daily before breakfast.   Yes Historical Provider, MD  losartan (COZAAR) 100 MG tablet Take 100 mg by mouth every morning.    Yes Historical Provider, MD  magnesium oxide (MAG-OX) 400 MG tablet Take 1 tablet (400 mg total) by mouth daily. 11/12/14  Yes Zenia Resides, MD  memantine (NAMENDA) 10 MG tablet Take 10 mg by mouth 2 (two)  times daily.  03/04/15  Yes Historical Provider, MD  PARoxetine (PAXIL) 10 MG tablet Take 1 tablet (10 mg total) by mouth daily. 04/18/15  Yes Zenia Resides, MD  propranolol ER (INDERAL LA) 80 MG 24 hr capsule Take 1 capsule (80 mg total) by mouth daily. 08/03/14  Yes Zenia Resides, MD  ranitidine (ZANTAC) 150 MG tablet Take 150 mg by mouth daily after lunch.    Yes Historical Provider, MD  traMADol (ULTRAM) 50 MG tablet Take 1 tablet (50 mg total) by mouth every 8 (eight) hours as needed. 05/14/15  Yes Owens Shark, NP  atorvastatin (LIPITOR) 10 MG tablet TAKE 1 TABLET (10 MG TOTAL) BY MOUTH DAILY TO LOWER CHOLESTEROL Patient not taking: Reported on 05/26/2015 10/30/14   Zenia Resides, MD  Memantine HCl-Donepezil HCl [facility]) 28-10 MG CP24 Take 1 capsule by mouth at bedtime. Patient not taking: Reported on 05/26/2015 05/01/15   Melvenia Beam, MD   BP 94/59 mmHg  Pulse 74  Temp(Src) 97.7 F (36.5 C) (Oral)  Resp 22  SpO2 99% Physical Exam  Constitutional: No distress.  Thin, frail-appearing man awake in bed, in no acute distress  HENT:  Head: Normocephalic and atraumatic.  dry mucous membranes  Eyes: Conjunctivae are normal. Pupils are equal, round, and reactive to light.  Neck: Neck supple.  Cardiovascular: Normal rate, regular rhythm and normal heart sounds.   No murmur heard. Pulmonary/Chest: Effort normal and breath sounds normal.  Abdominal: Soft. Bowel sounds are normal. He exhibits no distension. There is no tenderness.  Genitourinary:  Gross melena on rectal exam, non-thrombosed external hemorrhoids present  Musculoskeletal: He exhibits no edema.  Neurological:  Awake, oriented to person, moving all 4 ext   Skin: Skin is warm and dry.  Stage I sacral decubitus ulcer present  Nursing note and vitals reviewed. Chaperone was present during exam.   ED Course  Procedures (including critical care time) Labs Review Labs Reviewed  CBC WITH DIFFERENTIAL/PLATELET -  Abnormal; Notable for the following:    WBC 16.4 (*)    RBC 2.13 (*)    Hemoglobin 6.3 (*)    HCT 19.7 (*)    RDW 17.1 (*)    Neutrophils Relative % 85 (*)    Neutro Abs 14.0 (*)    Lymphocytes Relative 11 (*)    All other components within normal limits  COMPREHENSIVE METABOLIC PANEL - Abnormal; Notable for the following:    Glucose, Bld 182 (*)    BUN 38 (*)    Total Protein 6.0 (*)    Albumin 2.7 (*)    ALT 11 (*)    Total Bilirubin 0.2 (*)    All other components within normal limits  I-STAT CHEM 8, ED - Abnormal; Notable for the following:    BUN 35 (*)    Glucose, Bld 178 (*)    Hemoglobin 9.9 (*)    HCT 29.0 (*)    All other components within normal limits  POC OCCULT BLOOD, ED - Abnormal; Notable for the  following:    Fecal Occult Bld POSITIVE (*)    All other components within normal limits  I-STAT CHEM 8, ED - Abnormal; Notable for the following:    BUN 39 (*)    Glucose, Bld 178 (*)    Hemoglobin 7.5 (*)    HCT 22.0 (*)    All other components within normal limits  PROTIME-INR  APTT  I-STAT CG4 LACTIC ACID, ED  TYPE AND SCREEN  PREPARE RBC (CROSSMATCH)    Imaging Review No results found.   EKG Interpretation   Date/Time:  Sunday May 26 2015 12:20:06 EDT Ventricular Rate:  64 PR Interval:  84 QRS Duration: 92 QT Interval:  438 QTC Calculation: 452 R Axis:   14 Text Interpretation:  Sinus rhythm Short PR interval Confirmed by LITTLE  MD, RACHEL [id number]) on 05/26/2015 1:36:59 PM         MDM   Final diagnoses:  Melena   81 year old male with extensive past medical history including diffuse large B-cell lymphoma who presents with melena that began this morning. He was brought in by EMS and was noted to be hypotensive during transport. Patient awake, alert, chronically ill-appearing but in no acute distress on arrival. Vital signs notable for hypertension 92/57. No abdominal tenderness on exam however gross melena noted. Immediately obtained 2  large bore IVs; the patient had received normal saline 1L bolus. Obtained labs listed above including CBC, type and screen. CBC notable for hemoglobin 6.3, hematocrit 19.7. White count 16.4. Immediately ordered 1 unit of packed red blood cells for transfusion. I spoke with [facility] gastroenterology regarding the patient's presentation, and they will see patient in consultation. I spoke with [facility] at [facility] who recommended admission to [facility] on Family medicine team for continuity of care as patient is followed in the family medicine clinic. I discussed presentation and admission with Dr. Lincoln Brigham on [facility] admitting team who accepted patient to Dr. Mingo Amber. On reexamination, the patient remains with stable blood pressure in the high 91Q systolic.  Discussed risks and benefits of transfer with the patient's family and they are agreeable to plan. Patient transferred in guarded condition.   CRITICAL CARE Performed by: Wenda Overland Little   Total critical care time: 45 minutes  Critical care time was exclusive of separately billable procedures and treating other patients.  Critical care was necessary to treat or prevent imminent or life-threatening deterioration.  Critical care was time spent personally by me on the following activities: development of treatment plan with patient and/or surrogate as well as nursing, discussions with consultants, evaluation of patient's response to treatment, examination of patient, obtaining history from patient or surrogate, ordering and performing treatments and interventions, ordering and review of laboratory studies, ordering and review of radiographic studies, pulse oximetry and re-evaluation of patient's condition.    Sharlett Iles, MD 05/26/15 1710

## 2015-05-26 NOTE — ED Notes (Signed)
Pt from home via EMS.  Per EMS pt had black tarry stool this am with syncopal episode.  No c/o abd pain.  Family reports hx of dementia.  No acute distress.

## 2015-05-26 NOTE — H&P (Signed)
[facility] Admission History and Physical Service Pager: [phone]  Patient name: Nicholas Olsen Medical record number: [id number] Date of birth: Mar 26, 1934 Age: 81 y.o. Gender: male  Primary Care Provider: Zigmund Gottron, MD Consultants: GI, Oncology, Palliative Code Status: Full Code (discussed with wife on admission)  Chief Complaint:  " Black stools",  Near-syncopal event  Assessment and Plan: Nicholas Olsen is a 81 y.o. male presenting with black tarry stools and near syncope event, consistent with presumed UGIB with symptomatic ABLA. Initially at [place], transferred to [facility]. PMH is significant for Non- Hogkins B cell lymphoma, remote h/o prostate cancer, HTN, DM2, HLD  Presumed UGI bleed, with symptomatic anemia from acute blood loss anemia - Hgb 6.3 on presentation to ED with + FOBT and melena. Initially mildly hypotensive to Systolic BPs in the 41O which improved with fluid hydration and transfusion of 1 unit in the emergency department. He has a significant history of itching with blood transfusions, but no history of severe reaction. Concern for recurrent gastric lymphoma as etiology for UGIB, no h/o PUD or NSAID use. - Admit to step down, Dr Mingo Amber attending - GI consulted, appreciate recs - EGD in AM - Protonix drip - transfuse additional 1 unit pRBCs, follow post transfusion Hct- benadryl PRN itching with transfusions - Serial CBCs - NPO until RN stroke swallow screen   Non Hodgekins lymphoma, metastatic-  [facility]OP/Rituxan chemo in 11/2010-03/2011 with history of hospitalization for similar GI bleeding in 12/2010 thought to be secondary to Lymphoma with gastric involvement. Lymphoma history also significant for multiple stage PET CTs with most recent in 03/2015 which showed increasing lung lesions and, splenic hilum lesion and sclerotic lesion at T6 (noted on CT 10/2014) Last seen by Oncology on 8/2 for abdominal pain. Had CT abd pelvis on 05/17/2015 at  that time which showed interval enlargement of abdominal peri-gastric soft tissue mass with thickening of gastric wall, but not felt to be contributing to his pain, next Onc appt 8/19 - Will consult Onc in AM for recs - Pt's wife has stated that he does not wish further chemotherapy - Will consult Palliative care for recs, goals of care discussion in setting of likely recurrent cancer in alzheimer's patient - Wife desires that he be full code   Abdominal Pain- Etiology potentially secondary to enlarging lesser curvature of the stomach mass, especially as this current presentation is similar to his initial presentation in 2012 when he was diagnosed with lymphoma - Tylenol for pain - No NSAIDs with UGIB - Currently hold narcotics on admit to avoid hypotension. Consider narcotics as needed for pain if BP improves, but will follow Palliative care recs for pain.   Concern for Stroke- New right facial droop with worsening disorientation concerning for stroke. Exam findings in ED also concerning for intracranial process. If he has recurrent cancer, this would place him at increased clot risk - CT head neg for stroke, consider MRI vs repeat CT follow up imaging is worsening status after discussion with family. No acute intervention at this time given stable exam findings. He is well outside of the window for TPA and given GI bleed there is limited intervention possible - Hold home ASA. Will defer on starting APT even in the setting of a stroke as he has an active GI bleed at this time - Consider neuro consult after discussing with wife and goals of care established - PT/OT when stable - SLP eval   Hypotensive with pre-syncope likely secondary to  GI bleeding as well as poor PO intake. Hypotension improving in the ED with blood transfusion and IV fluid hydration - Home home antihypertensives, Norvasc, Losartan, propranolol  Hypothyroid- Last TSH elevated at 5.2 on 04/10/2015  - Continue home  levothyroxine after passing swallow study - Recheck TSH  DM2- last A1c 02/22/2015 7.3, does not take DM medications at home - SSI - CBG q6hr  Vascular Dementia- Progressively worsening dementia. At risk for sun downing - Continue home Namenda after passing swallow study - Hopefully wife / family will stay at bedside overnight. Otherwise if sun downs, decrease stimuli, re-orientation, bed side sitter, consider low dose haldol (if poses harm to self or others)  History of Tinea Cruris - Continue home Lotrisone BID  Depression- Stable - Continue home Paxil  HLD - Continue home Lipitor 10 tomorrow  FEN/GI: IVF 150 NS, NPO for now until SLP eval and after EGD Prophylaxis: SCDs, hold on pharmacologic VTE ppx in setting of GI bleed  Disposition: Admit to SDU for acute UGIB with symptomatic anemia / pre-syncope, resolved hypotension. S/p IVF rehydration, 2u PRBC. GI following plan for EGD on 8/15, consult Oncology given concern recurrence cancer as etiology of bleed. - Note if during hospitalization pt requires active chemotherapy, would need to be transferred back to [facility]  History of Present Illness:  History provided by Varney Biles, pt's wife secondary to the nature of Mr. Bernat's Dementia   Nicholas Olsen is a 81 y.o. male with PMH significant for Non- Hodgekins B cell lymphoma presenting with increasing disorientation that started yesterday, then today black tarry stools with near syncope. Yesterday wife noted that he was more " out of it" than usual. He slept more than usual and had less appetite. He has had increasing abdominal pain over the last month and she feels this change in appetitite is due to this. Then this AM when he went to the bathroom and when she went to check on him, she noted that he had smeared stool around the bathroom and that he was weak requiring help getting back to the bedroom. His eyelids were fluttering and he was not following commands making her think he may  have passed out. He did not fall not did he have any head trauma. She then noted that the right corner of his mouth was drooping. She took his blood pressure and it was 75/50, whereas his usual is around 125/70. He did not eat anything today or take any medications Today is the first time she noted dark stools but he usually goes to the restroom by himself, only occasionally requiring help with clean up She denies that he has had chest pain but occasionally complains of abd pain extending to his epigastric area. Denies him complaining of SOB. She denies recent illness or fevers, diarrhea, nausea or emesis. He has had mild constipation  ED course with initial presentation to [facility] due to cancer history (taken there by EMS) and evaluation with presumed UGIB and hypotension, stabilized with IVF rehydration and 1u PRBC, GI consulted (evaluated at [facility]), FPTS called for transfer to [facility] and admit. Accepted once stabilized, resolved hypotension, and no active GIB. FPTS requested transfer [facility] directly to [facility] for re-evaluation upon arrival and repeat vitals, IVF, and start 2nd u PRBC. Additionally after transfer, found out per GI that bleed may be due to recurrent cancer, and patient may need chemotherapy in near future, in which case would need to be transferred back to [facility].  She reports that  he has previously stated that he would not wish for further chemotherapy. Family has not discussed palliative care or goals previously, but would be open to this discussion.  Performs some ADLs at home, despite h/o Alzheimer's. Lives with wife only. Other family support nearby. No home health nursing. Some ambulation on own, does have a walker if needed.  Review Of Systems: Per HPI   Patient Active Problem List   Diagnosis Date Noted  . Melena 05/26/2015  . Pressure ulcer 05/26/2015  . GI bleed 05/26/2015  . Acute upper GI bleed 05/26/2015  . Tinea cruris 04/10/2015  . Loss of weight 04/10/2015  . Hypomagnesemia  11/12/2014  . Rotator cuff syndrome of right shoulder 02/09/2014  . Muscle spasm of back 01/23/2014  . Cramps of lower extremity 10/25/2013  . Skin benign neoplasm 05/19/2013  . Hypothyroidism 04/07/2013  . Diabetes 02/08/2013  . Benign essential tremor 02/08/2013  . Vascular dementia, uncomplicated 31/49/7026  . Reflux esophagitis 08/20/2011  . Diffuse large B cell lymphoma   . OSTEOPENIA 01/04/2009  . ALLERGIC RHINITIS 02/06/2008  . PROSTATE CANCER 12/09/2006  . Type II diabetes mellitus with peripheral circulatory disorder 12/09/2006  . HYPERCHOLESTEROLEMIA 12/09/2006  . HYPERTENSION, BENIGN SYSTEMIC 12/09/2006  . TACHYCARDIA, PAROXYSMAL SUPRAVENTRICULAR 12/09/2006  . CLAUDICATION, INTERMITTENT 12/09/2006  . NEPHROLITHIASIS 12/09/2006  . CERVICAL SPINE DISORDER, NOS 12/09/2006   Past Medical History: Past Medical History  Diagnosis Date  . Cancer   . Diabetes mellitus   . Diffuse large B cell lymphoma 2012    gastric found on EGD  . Hyperthyroidism     Thyroid radiation  . Radiation 1. 7/00  2. 9/00    1. Finished external beam radiation to prostate  2. Gold seeds in prostate  . History of ETT 02/24/06    OK  . Carotid artery occlusion 2/07    Carotid ultrasound: Rt nl: Lt<50% stenosis  . PSVT (paroxysmal supraventricular tachycardia)   . Hypertension    Past Surgical History: Past Surgical History  Procedure Laterality Date  . Tests      08/04/06 Idl: 80 direct-08/20/06, ABI: 0.85 mild PAD-02/05/05, barium enema-cardiac cath 1/00 normal-cardiolyte no ischemia, EF=43%-10/26/00, CT-head-CXR-ECG-echo EF=55-65%-02/16/05, thyroid scan-US-abdominal-xray-spine  . Cardiac catheterization  2010    at [facility]. No significant CAD.   Social History: Social History  Substance Use Topics  . Smoking status: Former Smoker -- 0.25 packs/day    Quit date: 10/12/2000  . Smokeless tobacco: Former Systems developer  . Alcohol Use: No   Additional social history: Lives with wife Please also refer to  relevant sections of EMR.  Family History: Family History  Problem Relation Age of Onset  . Coronary artery disease Other    Allergies and Medications: Allergies  Allergen Reactions  . Amoxicillin Anaphylaxis  . Erythromycin Ethylsuccinate Other (See Comments)    REACTION: UNKNOWN REACTION POSSIBLE HIVES PER FAMILY  . Remeron [Mirtazapine] Other (See Comments)    REACTION: PER FAMILY PATIENT BECAME DISORIENTED, LETHARGIC, TOOK DAYS TO WEAR OFF  . Simvastatin Other (See Comments)    REACTION: tried on multiple statins and had muscle pain  . Sulfamethoxazole Other (See Comments)    REACTION: UNKNOWN  . Tetracycline Hcl Other (See Comments)    REACTION: unspecified   No current facility-administered medications on file prior to encounter.   Current Outpatient Prescriptions on File Prior to Encounter  Medication Sig Dispense Refill  . ALPRAZolam (XANAX) 0.25 MG tablet Take 1 tablet (0.25 mg total) by mouth once. Take 30 minutes prior  to MRI 1 tablet 0  . amLODipine (NORVASC) 10 MG tablet Take 10 mg by mouth every morning.     Marland Kitchen aspirin 81 MG tablet Take 81 mg by mouth every morning.     Marland Kitchen b complex vitamins tablet Take 1 tablet by mouth every morning.     . cetirizine (ZYRTEC) 10 MG tablet Take 10 mg by mouth daily as needed for allergies.    . clotrimazole-betamethasone (LOTRISONE) cream APPLY TOPICALLY TWICE A DAY FOR GROIN RASH 45 g 1  . levothyroxine (SYNTHROID, LEVOTHROID) 50 MCG tablet Take 50 mcg by mouth daily before breakfast.    . losartan (COZAAR) 100 MG tablet Take 100 mg by mouth every morning.     . magnesium oxide (MAG-OX) 400 MG tablet Take 1 tablet (400 mg total) by mouth daily. 90 tablet 3  . PARoxetine (PAXIL) 10 MG tablet Take 1 tablet (10 mg total) by mouth daily.    . propranolol ER (INDERAL LA) 80 MG 24 hr capsule Take 1 capsule (80 mg total) by mouth daily. 90 capsule 3  . ranitidine (ZANTAC) 150 MG tablet Take 150 mg by mouth daily after lunch.     .  traMADol (ULTRAM) 50 MG tablet Take 1 tablet (50 mg total) by mouth every 8 (eight) hours as needed. 30 tablet 0  . atorvastatin (LIPITOR) 10 MG tablet TAKE 1 TABLET (10 MG TOTAL) BY MOUTH DAILY TO LOWER CHOLESTEROL (Patient not taking: Reported on 05/26/2015) 90 tablet 3  . Memantine HCl-Donepezil HCl (NAMZARIC) 28-10 MG CP24 Take 1 capsule by mouth at bedtime. (Patient not taking: Reported on 05/26/2015) 30 capsule 11  . [DISCONTINUED] primidone (MYSOLINE) 50 MG tablet Take 50 mg by mouth at bedtime.       Objective: BP 113/61 mmHg  Pulse 66  Temp(Src) 97.7 F (36.5 C) (Oral)  Resp 15  Ht 6' (1.829 m)  Wt 171 lb 8.3 oz (77.8 kg)  BMI 23.26 kg/m2  SpO2 100% Exam: General: Mild distress, confused. On presentation he states "I can't even remember my name" HEENT: PERRL, no LAD, MMM Cardiovascular: RRR, no murmurs auscultated Respiratory: CTAB, no wheezes or crackles Abdomen: soft, difficult to ascertain if tenderness was appreciated secondary to mental status, no masses palpated, + BS, no hepato-splenomegally Extremities: No LE edema, WWP, 1+ PD Skin: No rashes or lesions Psych: flat affect, voicing frustation  Neuro Exam markedly limited secondary to patient's ability to comply with exam, but aided by Wife. He followed limited commands when she gave them  Cranial Nerves II - XII - II - Tracks examiner III, IV, VI - Extraocular movements intact. V - Facial sensation intact bilaterally, but limited secondary to patient's ability to communicate  VII - Right facial droop with right naso-labial flattening, else wnl VIII - Decreased hearing at base line X - No dysarthria. XI - XII Unable to assess  Motor Strength - 5/5 b/l grip strength, able to lift bilateral arms,  3/5 b/l LE strength  Motor Tone - Muscle tone was assessed at the neck and appendages and was normal.  Coordination unable to be ascertained   Labs and Imaging: CBC BMET   Recent Labs Lab 05/26/15 1737  WBC 14.5*   HGB 8.2*  HCT 24.7*  PLT 276    Recent Labs Lab 05/26/15 1240  05/26/15 1325  NA 140  < > 139  K 4.3  < > 4.3  CL 103  < > 102  CO2 29  --   --  BUN 38*  < > 39*  CREATININE 0.97  < > 0.90  GLUCOSE 182*  < > 178*  CALCIUM 9.3  --   --   < > = values in this interval not displayed.   Lactic Acid - 1.10  CT Head w/o contrast IMPRESSION: 1. Stable atrophy and diffuse white matter disease. 2. No acute intracranial abnormality.  CXR Portable 1v IMPRESSION: Stable bilateral lung masses. No acute abnormality is noted.  Olin Hauser, DO 05/26/2015, 9:13 PM PGY-2, [place] [facility] pager: [phone], text pages welcome  Upper Level Addendum:  I have seen and evaluated this patient along with Dr. Lincoln Brigham and reviewed the above note, making necessary revisions in purple.  Nobie Putnam, [place], PGY-3

## 2015-05-26 NOTE — Consult Note (Signed)
Reason for Consult: GI bleed Referring Physician: [facility]  Caroline Sauger Loflin HPI: This is a 81 year old male with a PMH of diffuse large B cell lymphoma, DM, hyperthyroidism, HTN, and DM admitted to the hospital with complaints of melena.  The patient reports the onset of his symptoms earlier today.  In fact, his wife is the one that discovered that he had melena.  The patient has a history of dementia and when he came out of the restroom he had melena all over his body.  His wife believes he had a near syncopal episode.  She took his blood pressure and his SBP was in the 70 range.  EMS was called and he was confirmed to be hypotensive, but liter of normal saline improved his symptoms.  His HGB dropped to a low of 6.3 g/dL when it was checked in the ER and it dropped from 11.2 compared to the June value.  He has received one unit of PRBC.  In 2014 he underwent a colonoscopy with Dr. Collene Mares with findings of diverticula and a small polyp and in 2012 he was diagnosed with the lymphoma.  It presented as an ulcerated mass in the gastric lumen, per the documentation, but I do not have the actual report at this time.  Recently he was evaluated by Dr. Collene Mares in the office for his complaints of abdominal pain and he followed up with Dr. Benay Spice last week.  A CT scan of the ABM was performed and it did reveal an enlarging lesser curvature mass.  He also has lymphoma in the RLL of the lung, which was confirmed with a needle biopsy in 2014.  Per Dr. Gearldine Shown recent notes, it doe not seem that the current CT scan findings correlate with the patient's complaint of pain.  However, the patient's wife reports that his abdominal pain is similar to his prior presentation of abdominal pain in 2012 with the lymphoma.  With treatment his restaging PET scans in the past were negative for any residual disease, but the 2014 PET scan did reveal a recurrence.    Past Medical History  Diagnosis Date  . Cancer   . Diabetes  mellitus   . Diffuse large B cell lymphoma 2012    gastric found on EGD  . Hyperthyroidism     Thyroid radiation  . Radiation 1. 7/00  2. 9/00    1. Finished external beam radiation to prostate  2. Gold seeds in prostate  . History of ETT 02/24/06    OK  . Carotid artery occlusion 2/07    Carotid ultrasound: Rt nl: Lt<50% stenosis  . PSVT (paroxysmal supraventricular tachycardia)   . Hypertension     Past Surgical History  Procedure Laterality Date  . Tests      08/04/06 Idl: 80 direct-08/20/06, ABI: 0.85 mild PAD-02/05/05, barium enema-cardiac cath 1/00 normal-cardiolyte no ischemia, EF=43%-10/26/00, CT-head-CXR-ECG-echo EF=55-65%-02/16/05, thyroid scan-US-abdominal-xray-spine  . Cardiac catheterization  2010    at [facility]. No significant CAD.    Family History  Problem Relation Age of Onset  . Coronary artery disease Other     Social History:  reports that he quit smoking about 14 years ago. He has quit using smokeless tobacco. He reports that he does not drink alcohol or use illicit drugs.  Allergies:  Allergies  Allergen Reactions  . Amoxicillin Anaphylaxis  . Erythromycin Ethylsuccinate Other (See Comments)    REACTION: UNKNOWN REACTION POSSIBLE HIVES PER FAMILY  . Remeron [Mirtazapine] Other (See Comments)  REACTION: PER FAMILY PATIENT BECAME DISORIENTED, LETHARGIC, TOOK DAYS TO WEAR OFF  . Simvastatin Other (See Comments)    REACTION: tried on multiple statins and had muscle pain  . Sulfamethoxazole Other (See Comments)    REACTION: UNKNOWN  . Tetracycline Hcl Other (See Comments)    REACTION: unspecified    Medications:  Scheduled:  Continuous: . sodium chloride      Results for orders placed or performed during the hospital encounter of 05/26/15 (from the past 24 hour(s))  CBC with Differential/Platelet     Status: Abnormal   Collection Time: 05/26/15 12:40 PM  Result Value Ref Range   WBC 16.4 (H) 4.0 - 10.5 K/uL   RBC 2.13 (L) 4.22 - 5.81 MIL/uL    Hemoglobin 6.3 (LL) 13.0 - 17.0 g/dL   HCT 19.7 (L) 39.0 - 52.0 %   MCV 92.5 78.0 - 100.0 fL   MCH 29.6 26.0 - 34.0 pg   MCHC 32.0 30.0 - 36.0 g/dL   RDW 17.1 (H) 11.5 - 15.5 %   Platelets 309 150 - 400 K/uL   Neutrophils Relative % 85 (H) 43 - 77 %   Neutro Abs 14.0 (H) 1.7 - 7.7 K/uL   Lymphocytes Relative 11 (L) 12 - 46 %   Lymphs Abs 1.7 0.7 - 4.0 K/uL   Monocytes Relative 4 3 - 12 %   Monocytes Absolute 0.6 0.1 - 1.0 K/uL   Eosinophils Relative 0 0 - 5 %   Eosinophils Absolute 0.0 0.0 - 0.7 K/uL   Basophils Relative 0 0 - 1 %   Basophils Absolute 0.0 0.0 - 0.1 K/uL  Comprehensive metabolic panel     Status: Abnormal   Collection Time: 05/26/15 12:40 PM  Result Value Ref Range   Sodium 140 135 - 145 mmol/L   Potassium 4.3 3.5 - 5.1 mmol/L   Chloride 103 101 - 111 mmol/L   CO2 29 22 - 32 mmol/L   Glucose, Bld 182 (H) 65 - 99 mg/dL   BUN 38 (H) 6 - 20 mg/dL   Creatinine, Ser 0.97 0.61 - 1.24 mg/dL   Calcium 9.3 8.9 - 10.3 mg/dL   Total Protein 6.0 (L) 6.5 - 8.1 g/dL   Albumin 2.7 (L) 3.5 - 5.0 g/dL   AST 19 15 - 41 U/L   ALT 11 (L) 17 - 63 U/L   Alkaline Phosphatase 49 38 - 126 U/L   Total Bilirubin 0.2 (L) 0.3 - 1.2 mg/dL   GFR calc non Af Amer >60 >60 mL/min   GFR calc Af Amer >60 >60 mL/min   Anion gap 8 5 - 15  Type and screen     Status: None (Preliminary result)   Collection Time: 05/26/15 12:40 PM  Result Value Ref Range   ABO/RH(D) B POS    Antibody Screen NEG    Sample Expiration 05/29/2015    Unit Number [id number]    Blood Component Type RBC, LR IRR    Unit division 00    Status of Unit ISSUED    Transfusion Status OK TO TRANSFUSE    Crossmatch Result Compatible    Unit Number [id number]    Blood Component Type RBC, LR IRR    Unit division 00    Status of Unit ALLOCATED    Transfusion Status OK TO TRANSFUSE    Crossmatch Result Compatible   Protime-INR     Status: None   Collection Time: 05/26/15 12:40 PM  Result Value Ref Range  Prothrombin Time 15.0 11.6 - 15.2 seconds   INR 1.16 0.00 - 1.49  APTT     Status: None   Collection Time: 05/26/15 12:40 PM  Result Value Ref Range   aPTT 29 24 - 37 seconds  POC occult blood, ED Provider will collect     Status: Abnormal   Collection Time: 05/26/15 12:52 PM  Result Value Ref Range   Fecal Occult Bld POSITIVE (A) NEGATIVE  I-Stat CG4 Lactic Acid, ED     Status: None   Collection Time: 05/26/15  1:00 PM  Result Value Ref Range   Lactic Acid, Venous 1.73 0.5 - 2.0 mmol/L  I-stat chem 8, ed     Status: Abnormal   Collection Time: 05/26/15  1:03 PM  Result Value Ref Range   Sodium 139 135 - 145 mmol/L   Potassium 4.2 3.5 - 5.1 mmol/L   Chloride 101 101 - 111 mmol/L   BUN 35 (H) 6 - 20 mg/dL   Creatinine, Ser 1.00 0.61 - 1.24 mg/dL   Glucose, Bld 178 (H) 65 - 99 mg/dL   Calcium, Ion 1.29 1.13 - 1.30 mmol/L   TCO2 27 0 - 100 mmol/L   Hemoglobin 9.9 (L) 13.0 - 17.0 g/dL   HCT 29.0 (L) 39.0 - 52.0 %  Prepare RBC     Status: None   Collection Time: 05/26/15  1:14 PM  Result Value Ref Range   Order Confirmation ORDER PROCESSED BY BLOOD BANK   I-stat chem 8, ed     Status: Abnormal   Collection Time: 05/26/15  1:25 PM  Result Value Ref Range   Sodium 139 135 - 145 mmol/L   Potassium 4.3 3.5 - 5.1 mmol/L   Chloride 102 101 - 111 mmol/L   BUN 39 (H) 6 - 20 mg/dL   Creatinine, Ser 0.90 0.61 - 1.24 mg/dL   Glucose, Bld 178 (H) 65 - 99 mg/dL   Calcium, Ion 1.25 1.13 - 1.30 mmol/L   TCO2 28 0 - 100 mmol/L   Hemoglobin 7.5 (L) 13.0 - 17.0 g/dL   HCT 22.0 (L) 39.0 - 52.0 %     No results found.  ROS:  As stated above in the HPI otherwise negative.  Blood pressure 87/55, pulse 75, temperature 97.7 F (36.5 C), temperature source Oral, resp. rate 17, SpO2 97 %.    PE: Gen: Semi alert, confused, weak appearing Neck: Supple, no LAD Lungs: CTA Bilaterally CV: RRR without M/G/R ABM: Soft, mildly diffusely tender, +BS Ext: No C/C/E  Assessment/Plan: 1)  Melena. 2) Anemia. 3) Recurrent lymphoma. 4) Hypotension.   I think he may be bleeding from the recurrent lymphoma.  His anemia in 2012 is what prompted the EGD with the ultimate diagnosis.  He is hypotensive at this time, but it is improving with resuscitation.  I will schedule him to have an EGD tomorrow for further evaluation and possible treatment, however, it is likely he will require retreatment for his lymphoma with chemotherapy.  Plan: 1) EGD tomorrow. 2) Follow HGB and transfuse as necessary. 3) Follow blood pressure. 4) PPI.  Kehlani Vancamp D 05/26/2015, 3:42 PM

## 2015-05-27 ENCOUNTER — Inpatient Hospital Stay (HOSPITAL_COMMUNITY): Payer: Medicare PPO | Admitting: Anesthesiology

## 2015-05-27 ENCOUNTER — Encounter (HOSPITAL_COMMUNITY): Payer: Self-pay | Admitting: *Deleted

## 2015-05-27 ENCOUNTER — Encounter (HOSPITAL_COMMUNITY)
Admission: EM | Disposition: A | Discharge status: Skilled Nursing Facility | Payer: Self-pay | Source: Home / Self Care | Attending: Internal Medicine

## 2015-05-27 DIAGNOSIS — E46 Unspecified protein-calorie malnutrition: Secondary | ICD-10-CM

## 2015-05-27 DIAGNOSIS — E860 Dehydration: Secondary | ICD-10-CM

## 2015-05-27 DIAGNOSIS — D630 Anemia in neoplastic disease: Secondary | ICD-10-CM

## 2015-05-27 DIAGNOSIS — Z7189 Other specified counseling: Secondary | ICD-10-CM

## 2015-05-27 DIAGNOSIS — I951 Orthostatic hypotension: Secondary | ICD-10-CM

## 2015-05-27 DIAGNOSIS — K922 Gastrointestinal hemorrhage, unspecified: Secondary | ICD-10-CM

## 2015-05-27 DIAGNOSIS — R55 Syncope and collapse: Secondary | ICD-10-CM

## 2015-05-27 DIAGNOSIS — K254 Chronic or unspecified gastric ulcer with hemorrhage: Secondary | ICD-10-CM

## 2015-05-27 DIAGNOSIS — F0390 Unspecified dementia without behavioral disturbance: Secondary | ICD-10-CM | POA: Insufficient documentation

## 2015-05-27 DIAGNOSIS — C8338 Diffuse large B-cell lymphoma, lymph nodes of multiple sites: Secondary | ICD-10-CM

## 2015-05-27 DIAGNOSIS — Z515 Encounter for palliative care: Secondary | ICD-10-CM

## 2015-05-27 DIAGNOSIS — D72829 Elevated white blood cell count, unspecified: Secondary | ICD-10-CM

## 2015-05-27 DIAGNOSIS — C8330 Diffuse large B-cell lymphoma, unspecified site: Principal | ICD-10-CM

## 2015-05-27 DIAGNOSIS — K921 Melena: Secondary | ICD-10-CM | POA: Insufficient documentation

## 2015-05-27 HISTORY — PX: ESOPHAGOGASTRODUODENOSCOPY: SHX5428

## 2015-05-27 LAB — CBC
HEMATOCRIT: 25.1 % — AB (ref 39.0–52.0)
HEMATOCRIT: 27.7 % — AB (ref 39.0–52.0)
HEMATOCRIT: 25.1 % — AB (ref 39.0–52.0)
HEMOGLOBIN: 9 g/dL — AB (ref 13.0–17.0)
Hemoglobin: 8.4 g/dL — ABNORMAL LOW (ref 13.0–17.0)
Hemoglobin: 8.3 g/dL — ABNORMAL LOW (ref 13.0–17.0)
MCH: 29.8 pg (ref 26.0–34.0)
MCH: 29.1 pg (ref 26.0–34.0)
MCH: 29.7 pg (ref 26.0–34.0)
MCHC: 33.5 g/dL (ref 30.0–36.0)
MCHC: 32.5 g/dL (ref 30.0–36.0)
MCHC: 33.1 g/dL (ref 30.0–36.0)
MCV: 89 fL (ref 78.0–100.0)
MCV: 89.6 fL (ref 78.0–100.0)
MCV: 90 fL (ref 78.0–100.0)
PLATELETS: 240 10*3/uL (ref 150–400)
Platelets: 236 10*3/uL (ref 150–400)
Platelets: 225 10*3/uL (ref 150–400)
RBC: 2.82 MIL/uL — AB (ref 4.22–5.81)
RBC: 3.09 MIL/uL — AB (ref 4.22–5.81)
RBC: 2.79 MIL/uL — AB (ref 4.22–5.81)
RDW: 17 % — AB (ref 11.5–15.5)
RDW: 17.5 % — ABNORMAL HIGH (ref 11.5–15.5)
RDW: 17.5 % — ABNORMAL HIGH (ref 11.5–15.5)
WBC: 12.1 10*3/uL — AB (ref 4.0–10.5)
WBC: 11.6 10*3/uL — ABNORMAL HIGH (ref 4.0–10.5)
WBC: 10.2 10*3/uL (ref 4.0–10.5)

## 2015-05-27 LAB — GLUCOSE, CAPILLARY
GLUCOSE-CAPILLARY: 110 mg/dL — AB (ref 65–99)
GLUCOSE-CAPILLARY: 119 mg/dL — AB (ref 65–99)
GLUCOSE-CAPILLARY: 130 mg/dL — AB (ref 65–99)
GLUCOSE-CAPILLARY: 87 mg/dL (ref 65–99)
GLUCOSE-CAPILLARY: 98 mg/dL (ref 65–99)
Glucose-Capillary: 84 mg/dL (ref 65–99)

## 2015-05-27 LAB — TSH: TSH: 3.134 u[IU]/mL (ref 0.350–4.500)

## 2015-05-27 LAB — BASIC METABOLIC PANEL
Anion gap: 6 (ref 5–15)
BUN: 20 mg/dL (ref 6–20)
CHLORIDE: 108 mmol/L (ref 101–111)
CO2: 27 mmol/L (ref 22–32)
CREATININE: 0.92 mg/dL (ref 0.61–1.24)
Calcium: 8.4 mg/dL — ABNORMAL LOW (ref 8.9–10.3)
GFR calc Af Amer: >60 mL/min (ref >60)
GFR calc non Af Amer: >60 mL/min (ref >60)
Glucose, Bld: 107 mg/dL — ABNORMAL HIGH (ref 65–99)
Potassium: 3.4 mmol/L — ABNORMAL LOW (ref 3.5–5.1)
SODIUM: 141 mmol/L (ref 135–145)

## 2015-05-27 LAB — TYPE AND SCREEN
ABO/RH(D): B POS
ANTIBODY SCREEN: NEGATIVE
UNIT DIVISION: 0
UNIT DIVISION: 0

## 2015-05-27 LAB — TROPONIN I
Troponin I: <0.03 ng/mL (ref <0.031)
Troponin I: <0.03 ng/mL (ref <0.031)

## 2015-05-27 LAB — I-STAT CHEM 8, ED
BUN: 35 mg/dL — ABNORMAL HIGH (ref 6–20)
CALCIUM ION: 1.29 mmol/L (ref 1.13–1.30)
CREATININE: 1 mg/dL (ref 0.61–1.24)
Chloride: 101 mmol/L (ref 101–111)
Glucose, Bld: 178 mg/dL — ABNORMAL HIGH (ref 65–99)
HCT: 29 % — ABNORMAL LOW (ref 39.0–52.0)
HEMOGLOBIN: 9.9 g/dL — AB (ref 13.0–17.0)
POTASSIUM: 4.2 mmol/L (ref 3.5–5.1)
SODIUM: 139 mmol/L (ref 135–145)
TCO2: 27 mmol/L (ref 0–100)

## 2015-05-27 LAB — ABO/RH: ABO/RH(D): B POS

## 2015-05-27 SURGERY — EGD (ESOPHAGOGASTRODUODENOSCOPY)
Anesthesia: Monitor Anesthesia Care

## 2015-05-27 MED ORDER — PROPOFOL 10 MG/ML IV BOLUS
INTRAVENOUS | Status: DC | PRN
  Administered 2015-05-27 (×3): 20 mg via INTRAVENOUS

## 2015-05-27 MED ORDER — LIDOCAINE HCL (CARDIAC) 20 MG/ML IV SOLN
INTRAVENOUS | Status: DC | PRN
  Administered 2015-05-27: 50 mg via INTRAVENOUS

## 2015-05-27 MED ORDER — LACTATED RINGERS IV SOLN
INTRAVENOUS | Status: DC | PRN
  Administered 2015-05-27: 13:00:00 via INTRAVENOUS

## 2015-05-27 NOTE — Discharge Summary (Signed)
[facility] Transfer Summary  Patient name: Nicholas Olsen Medical record number: [id number] Date of birth: 22-Sep-1934 Age: 81 y.o. Gender: male Date of Admission: 05/26/2015  Date of  Transfer: 05/28/2015  Admitting Physician: Alveda Reasons, MD  Primary Care Provider: Zigmund Gottron, MD Consultants:  Medical Oncology Radiation Oncology  Gatroenterology   Indication for Hospitalization:   Upper GI bleed  Discharge Diagnoses/Problem List:  Patient Active Problem List   Diagnosis Date Noted  . Palliative care encounter 05/27/2015  . DNR (do not resuscitate) discussion 05/27/2015  . Gastrointestinal hemorrhage with melena   . Pre-syncope   . Dementia   . Melena 05/26/2015  . Pressure ulcer 05/26/2015  . GI bleed 05/26/2015  . Acute upper GI bleed 05/26/2015  . Tinea cruris 04/10/2015  . Loss of weight 04/10/2015  . Hypomagnesemia 11/12/2014  . Rotator cuff syndrome of right shoulder 02/09/2014  . Muscle spasm of back 01/23/2014  . Cramps of lower extremity 10/25/2013  . Skin benign neoplasm 05/19/2013  . Hypothyroidism 04/07/2013  . Diabetes 02/08/2013  . Benign essential tremor 02/08/2013  . Vascular dementia, uncomplicated 57/26/2035  . Reflux esophagitis 08/20/2011  . Diffuse large B cell lymphoma   . OSTEOPENIA 01/04/2009  . ALLERGIC RHINITIS 02/06/2008  . PROSTATE CANCER 12/09/2006  . Type II diabetes mellitus with peripheral circulatory disorder 12/09/2006  . HYPERCHOLESTEROLEMIA 12/09/2006  . HYPERTENSION, BENIGN SYSTEMIC 12/09/2006  . TACHYCARDIA, PAROXYSMAL SUPRAVENTRICULAR 12/09/2006  . CLAUDICATION, INTERMITTENT 12/09/2006  . NEPHROLITHIASIS 12/09/2006  . CERVICAL SPINE DISORDER, NOS 12/09/2006     Disposition: Transfer to [place] for Palliative XRT  Discharge Condition: Stable  Discharge Exam:   Objective: Temp: [97.5 F (36.4 C)-98.1 F (36.7 C)] 97.8 F (36.6 C) (08/16 0400) Pulse Rate:  [32-74] 58 (08/16 0800) Resp: [12-27] 16 (08/16 0800) BP: (92-124)/(54-78) 112/61 mmHg (08/16 0800) SpO2: [68 %-100 %] 98 % (08/16 0800) Weight: [178 lb (80.74 kg)] 178 lb (80.74 kg) (08/15 1146) Physical Exam:  General:NAD, comfortable appearing HEENT: PERRL, MMM. Improved right facial droop Cardiovascular: RRR, no murmurs auscultated Respiratory: CTAB, no wheezes or crackles Abdomen: soft, difficult to ascertain if tenderness was appreciated secondary to mental status, no masses palpated, + BS, no hepato-splenomegally Extremities: No LE edema, WWP, 1+ PD Skin: No rashes or lesions  Brief Hospital Course:    Nicholas Olsen is a 81 y.o. male presented with melena and near syncope event, consistent with presumed upper GI bleed. He additionally had new right facial droop on presentation concerning for stroke.  PMH is significant for Non- Hogkins B cell lymphoma, remote h/o prostate cancer, HTN, DM2, HLD  GI bleeding/B cell lymphoma - On presentation he was noted to be anemic to a hgb of 6.3. He had 2 units pRBCs and his Hgb trended up to 8.3 on day of transfer. His melena stopped by presentation. He was initially hypotensive with SBPs in the 80s, but after fluid repletion and blood products, his hypotension resolved. His home antihypertensives were held during his admission. GI was additionally consulted as well as his oncologists as he has a history of gastric ulcerative tumors with his B cell lymphoma. He had an EGD which showed multiple ulcerative gastric lesions as well as an antral lesion. Biopsies were taken. Oncology felt this represented worsening B lymphoma tumor burden and given his strong desire to not under go chemotherapy the decision was made proceed with palliative radiation therapy. Radiation Oncology saw his as well and planned for radiation teherapy  this week Palliative care was consulted with the decision made to proceed with DNR status and try to minimize disease symptoms  as much as possible  Concern for stroke - On presentation his wife felt he had new right facial droop noted the AM of admission. He had a CT head which was negative for acute infarct. After speaking further with the family, they did not wish further stroke work up  Vascular Dementia - He was continued on his home Namenda. He did have some agitation overnight HD2 that was controlled with re-orientation. Otherwise he remained stable  The remained of his chronic medical issues remained stable   Issues for Follow Up:  1. Palliative care Recs  Significant Procedures:  EGD- multiple gastric ulcerative lesions, antral polyp   Significant Labs and Imaging:   Recent Labs Lab 05/26/15 1240  05/26/15 1325 05/26/15 1737 05/27/15 0953  WBC 16.4*  --   --  14.5* 12.1*  HGB 6.3*  < > 7.5* 8.2* 8.4*  HCT 19.7*  < > 22.0* 24.7* 25.1*  PLT 309  --   --  276 240  < > = values in this interval not displayed.  Recent Labs Lab 05/26/15 1240 05/26/15 1303 05/26/15 1325 05/27/15 0628  NA 140 139 139 141  K 4.3 4.2 4.3 3.4*  CL 103 101 102 108  CO2 29  --   --  27  GLUCOSE 182* 178* 178* 107*  BUN 38* 35* 39* 20  CREATININE 0.97 1.00 0.90 0.92  CALCIUM 9.3  --   --  8.4*  ALKPHOS 49  --   --   --   AST 19  --   --   --   ALT 11*  --   --   --   ALBUMIN 2.7*  --   --   --       Results/Tests Pending at Time of Discharge: Biopsy result  Discharge Medications:    Medication List    ASK your doctor about these medications        ALPRAZolam 0.25 MG tablet  Commonly known as:  XANAX  Take 1 tablet (0.25 mg total) by mouth once. Take 30 minutes prior to MRI     amLODipine 10 MG tablet  Commonly known as:  NORVASC  Take 10 mg by mouth every morning.     aspirin 81 MG tablet  Take 81 mg by mouth every morning.     atorvastatin 10 MG tablet  Commonly known as:  LIPITOR  TAKE 1 TABLET (10 MG TOTAL) BY MOUTH DAILY TO LOWER CHOLESTEROL     b complex vitamins tablet  Take 1  tablet by mouth every morning.     cetirizine 10 MG tablet  Commonly known as:  ZYRTEC  Take 10 mg by mouth daily as needed for allergies.     clotrimazole-betamethasone cream  Commonly known as:  LOTRISONE  APPLY TOPICALLY TWICE A DAY FOR GROIN RASH     levothyroxine 50 MCG tablet  Commonly known as:  SYNTHROID, LEVOTHROID  Take 50 mcg by mouth daily before breakfast.     losartan 100 MG tablet  Commonly known as:  COZAAR  Take 100 mg by mouth every morning.     magnesium oxide 400 MG tablet  Commonly known as:  MAG-OX  Take 1 tablet (400 mg total) by mouth daily.     memantine 10 MG tablet  Commonly known as:  NAMENDA  Take 10 mg by mouth 2 (  two) times daily.     Memantine HCl-Donepezil HCl 28-10 MG Cp24  Commonly known as:  NAMZARIC  Take 1 capsule by mouth at bedtime.     PARoxetine 10 MG tablet  Commonly known as:  PAXIL  Take 1 tablet (10 mg total) by mouth daily.     propranolol ER 80 MG 24 hr capsule  Commonly known as:  INDERAL LA  Take 1 capsule (80 mg total) by mouth daily.     ranitidine 150 MG tablet  Commonly known as:  ZANTAC  Take 150 mg by mouth daily after lunch.     traMADol 50 MG tablet  Commonly known as:  ULTRAM  Take 1 tablet (50 mg total) by mouth every 8 (eight) hours as needed.        Discharge Instructions: Please refer to Patient Instructions section of EMR for full details.  Patient was counseled important signs and symptoms that should prompt return to medical care, changes in medications, dietary instructions, activity restrictions, and follow up appointments.   Follow-Up Appointments:   Veatrice Bourbon, MD 05/27/2015, 4:13 PM PGY-2, [place]

## 2015-05-27 NOTE — Anesthesia Preprocedure Evaluation (Addendum)
Anesthesia Evaluation  Patient identified by MRN, date of birth, ID band Patient awake    Reviewed: Allergy & Precautions, NPO status , Patient's Chart, lab work & pertinent test results  History of Anesthesia Complications Negative for: history of anesthetic complications  Airway Mallampati: II  TM Distance: >3 FB Neck ROM: Full    Dental  (+) Upper Dentures, Partial Lower   Pulmonary former smoker,  breath sounds clear to auscultation        Cardiovascular hypertension, + Peripheral Vascular Disease Rhythm:Regular Rate:Normal     Neuro/Psych PSYCHIATRIC DISORDERS negative neurological ROS     GI/Hepatic Neg liver ROS,   Endo/Other  diabetesHypothyroidism Hyperthyroidism   Renal/GU Renal disease     Musculoskeletal   Abdominal   Peds  Hematology   Anesthesia Other Findings   Reproductive/Obstetrics                           Anesthesia Physical Anesthesia Plan  ASA: III  Anesthesia Plan: MAC   Post-op Pain Management:    Induction: Intravenous  Airway Management Planned: Simple Face Mask and Natural Airway  Additional Equipment:   Intra-op Plan:   Post-operative Plan:   Informed Consent: I have reviewed the patients History and Physical, chart, labs and discussed the procedure including the risks, benefits and alternatives for the proposed anesthesia with the patient or authorized representative who has indicated his/her understanding and acceptance.   Dental advisory given  Plan Discussed with: CRNA  Anesthesia Plan Comments:        Anesthesia Quick Evaluation
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
## 2015-05-27 NOTE — Progress Notes (Signed)
Pt and family seen on afternoon rounds.  He had an EGD which showed multiple gastric ulcers with an isolated antral polyp concerning for worsening lymphoma.  On presentation pt was more alert. His wife and son were initially present and we revisited goals of care. They wanted to wait until their daughter arrived to make final decisions.  We discussed further work up of concern for stroke. Given GI bleed in setting of likely worsening lymphoma burden, pt's family wishes to defer work up for this and focus on comfort care.  They are awaiting final conversation and recommendations from oncology.   BP 114/63 mmHg  Pulse 63  Temp(Src) 97.5 F (36.4 C) (Axillary)  Resp 19  Ht 6\' 2"  (1.88 m)  Wt 178 lb (80.74 kg)  BMI 22.84 kg/m2  SpO2 100%  Exam Gen: much less confused and interactive that this AM and yesterday, reports he is comfortable Pulm: CTAB CV: RRR Extremities: left forearm IV, right forearm IV site c/d/i IV not in place Abd: soft, nontender ( able to report this)  A/P 81 y/o with B cell lymphoma with ulcerating gastric lesions likely causing upper G bleed, stable bleeding with no stools today  GI bleed likely secondary to increasing tumor burden from B cell lymphoma - Follow final decisions made with oncologist - Follow GI recs - Palliative care consulted and discussing goals of care - It was discussed with the family that should they choose further therapy for cancer, he will be [facility] to [facility] - Will hold on transfer to floor given high risk or bleeding with gastric ulcers - Maintain bilateral large bore IVs - Maintain SCDs while in bed  Will continue to follow closely  Annise Boran A. Lincoln Brigham MD, [facility] Resident PGY-2 Pager [phone]

## 2015-05-27 NOTE — Progress Notes (Signed)
Family Medicine Teaching Service Daily Progress Note Intern Pager: [phone]  Patient name: Nicholas Olsen Medical record number: [id number] Date of birth: 11-06-1933 Age: 81 y.o. Gender: male  Primary Care Provider: Zigmund Gottron, MD Consultants:  GI Oncology Palliative care  Code Status: Full  Pt Overview and Major Events to Date:  8/14: Admitted for melena and near syncopal event concerning for upper GI bleep  Assessment and Plan:  Nicholas Olsen is a 80 y.o. male presenting with black tarry stools and near syncope event, consistent with presumed UGIB with symptomatic ABLA. Initially at [facility], transferred to [facility]. PMH is significant for Non- Hogkins B cell lymphoma, remote h/o prostate cancer, HTN, DM2, HLD  Presumed Upper GI Bleed- concerning to be secondary to Lymophoma recurrence - GI following- EGD today 8/15--> 12:30 - S/p 2 units of blood follow post transfusion Hct - NPO in preparation - Protonic drip - Trend CBCs, especially if evidence of on going bleedin gon EGD - Hold home ASA  B cell lymphoma- Last CT with evidence of recurrence - Onc called this AM and will see him this afternoon to discuss treatment options - if pursues therapy will need transfer to [facility] - Palliative care consulted  Concern for stroke- CT head negative for stroke. Pt's wife feels right facial droop improving - Hold home ASA - PT/OT when stable   Mild hypokalemia- K 3.4 - Will replete   Hypotension- Improved with good perfusion after fluid repletion and 2 u pRBS - continue to hold anti hypertensives  Hypothyrodiism- repeat TSH 3.13 wnl - continue home synthroid  DM2- stable - SSI - CBG q6  Dementia- Progressing, at risk for sun downing - Continue home Namenda - Reduce stimuli, sitter if shows agitation, low dose haldol  FEN/GI: NPO, NS 150 ml/hr PPx: SCDs as tolerated  Disposition: Pending clinical improvement  Subjective:  Pt still confused and minimally  interactive  Objective: Temp:  [97.2 F (36.2 C)-98.6 F (37 C)] 97.2 F (36.2 C) (08/15 0817) Pulse Rate:  [56-79] 58 (08/15 0817) Resp:  [10-26] 18 (08/15 0817) BP: (85-121)/(50-94) 121/57 mmHg (08/15 0817) SpO2:  [71 %-100 %] 100 % (08/15 0817) Weight:  [171 lb 8.3 oz (77.8 kg)-178 lb 9.2 oz (81 kg)] 178 lb 9.2 oz (81 kg) (08/15 0817) Physical Exam:  General:NAD, comfortable appearing HEENT: PERRL, MMM Cardiovascular: RRR, no murmurs auscultated Respiratory: CTAB, no wheezes or crackles Abdomen: soft, difficult to ascertain if tenderness was appreciated secondary to mental status, no masses palpated, + BS, no hepato-splenomegally Extremities: No LE edema, WWP, 1+ PD Skin: No rashes or lesions Psych: flat affect, confused  Laboratory:  Recent Labs Lab 05/26/15 1240 05/26/15 1303 05/26/15 1325 05/26/15 1737  WBC 16.4*  --   --  14.5*  HGB 6.3* 9.9* 7.5* 8.2*  HCT 19.7* 29.0* 22.0* 24.7*  PLT 309  --   --  276    Recent Labs Lab 05/26/15 1240 05/26/15 1303 05/26/15 1325 05/27/15 0628  NA 140 139 139 141  K 4.3 4.2 4.3 3.4*  CL 103 101 102 108  CO2 29  --   --  27  BUN 38* 35* 39* 20  CREATININE 0.97 1.00 0.90 0.92  CALCIUM 9.3  --   --  8.4*  PROT 6.0*  --   --   --   BILITOT 0.2*  --   --   --   ALKPHOS 49  --   --   --   ALT 11*  --   --   --  AST 19  --   --   --   GLUCOSE 182* 178* 178* 107*    Lactic Acid: 1.10  Imaging/Diagnostic Tests: 8/14  CT Head w/o contrast IMPRESSION: 1. Stable atrophy and diffuse white matter disease. 2. No acute intracranial abnormality.  CXR Portable 1v IMPRESSION: Stable bilateral lung masses. No acute abnormality is noted.  Veatrice Bourbon, MD 05/27/2015, 9:12 AM PGY-2, [place] Intern pager: [phone], text pages welcome

## 2015-05-27 NOTE — Consult Note (Signed)
Consultation Note Date: 05/27/2015   Patient Name: Nicholas Olsen  DOB: [birth year]  MRN: [id number]  Age / Sex: 81 y.o., male   PCP: Zenia Resides, MD Referring Physician: Alveda Reasons, MD  Reason for Consultation: Establishing goals of care and Psychosocial/spiritual support  Palliative Care Assessment and Plan Summary of Established Goals of Care and Medical Treatment Preferences    Palliative Care Discussion Held Today:    This NP Wadie Lessen reviewed medical records, received report from team, assessed the patient and then meet at the patient's bedside along with his wife Nicholas Olsen  to discuss diagnosis prognosis, GOC, EOL wishes disposition and options.  A detailed discussion was had today regarding advanced directives.  Concepts specific to code status, artifical feeding and hydration, continued IV antibiotics and rehospitalization was had.  The difference between a aggressive medical intervention path  and a palliative comfort care path for this patient at this time was had.  Values and goals of care important to patient and family were attempted to be elicited.  Concept of Hospice and Palliative Care were discussed  Natural trajectory and expectations at EOL were discussed.  Questions and concerns addressed.  Hard Choices booklet left for review. Family encouraged to call with questions or concerns.  PMT will continue to support holistically.  MOST form introduced  Primary Decision Maker: wife/patient is unable to assist in decision making  Goals of Care/Code Status/Advance Care Planning:   Code Status: FULL- wife is strongly encouraged to consider DNR status understanding its outcomes in similar patients.   At this time wife is gathering information from all the providers, she expresses her satisfaction and trust of both Dr Andria Frames and Dr Benay Spice.  She awaits/values their input  This NP will meet tomorrow at 0900 with wife and daughter to continue  conversation   Psycho-social/Spiritual:   Support System: family, children from previous marriages and a son together.  "Very close knit family"   Prognosis:   Pending decisions regarding ongoing medical intervetnions  Discharge Planning:  Pending      Chief Complaint: bloody stools   History of Present Illness:   81 yo M with known PMH of large B cell lymphoma, DM2, hyperthyroidism, dementia,  HTN who presents with ongoing melena. Wife reported melena and pre-syncope yesterday. Came to ED due to profuse dark stools. Brought by EMS, found to by hypotensive, received NS in route. Hgb in ED was 6.3. GI consulted.  Recent CT abdomen 8/5 revealed growth of mass on lesser curvature of stomach.   Continued physical, functional and cognitive decline 2/2 to above.  No desire for further chemotherapy per EMR.  Family is faced with advanced directive decisions and anticipatory care needs.   Primary Diagnoses  Present on Admission:  . Melena . GI bleed . Acute upper GI bleed  Palliative Review of Systems:    -unable to illicit due to decreased cognition   I have reviewed the medical record, interviewed the patient and family, and examined the patient. The following aspects are pertinent.  Past Medical History  Diagnosis Date  . Cancer   . Diabetes mellitus   . Diffuse large B cell lymphoma 2012    gastric found on EGD  . Hyperthyroidism     Thyroid radiation  . Radiation 1. 7/00  2. 9/00    1. Finished external beam radiation to prostate  2. Gold seeds in prostate  . History of ETT 02/24/06    OK  . Carotid artery  occlusion 2/07    Carotid ultrasound: Rt nl: Lt<50% stenosis  . PSVT (paroxysmal supraventricular tachycardia)   . Hypertension    Social History   Social History  . Marital Status: Married    Spouse Name: Nicholas Olsen   . Number of Children: 6  . Years of Education: 12   Occupational History  . [employer]     Owns business- Former   Social History Main  Topics  . Smoking status: Former Smoker -- 0.25 packs/day    Quit date: 10/12/2000  . Smokeless tobacco: Former Systems developer  . Alcohol Use: No  . Drug Use: No  . Sexual Activity: Not Asked   Other Topics Concern  . None   Social History Narrative   Lives at home with wife, Nicholas Olsen    Very regular exercise.   Caffeine use:  1 cup decaf coffee/ 3 weeks    Family History  Problem Relation Age of Onset  . Coronary artery disease Other    Scheduled Meds: . sodium chloride   Intravenous Once  . atorvastatin  10 mg Oral q1800  . clotrimazole   Topical BID  . insulin aspart  0-9 Units Subcutaneous Q4H  . levothyroxine  50 mcg Oral QAC breakfast  . memantine  10 mg Oral BID  . [START ON 05/30/2015] pantoprazole (PROTONIX) IV  40 mg Intravenous Q12H  . PARoxetine  10 mg Oral Daily  . sodium chloride  3 mL Intravenous Q12H   Continuous Infusions: . pantoprozole (PROTONIX) infusion 8 mg/hr (05/26/15 2107)   PRN Meds:.acetaminophen **OR** acetaminophen, diphenhydrAMINE, ondansetron **OR** ondansetron (ZOFRAN) IV Medications Prior to Admission:  Prior to Admission medications   Medication Sig Start Date End Date Taking? Authorizing Provider  ALPRAZolam (XANAX) 0.25 MG tablet Take 1 tablet (0.25 mg total) by mouth once. Take 30 minutes prior to MRI 04/11/15  Yes Ladell Pier, MD  amLODipine (NORVASC) 10 MG tablet Take 10 mg by mouth every morning.    Yes Historical Provider, MD  aspirin 81 MG tablet Take 81 mg by mouth every morning.    Yes Historical Provider, MD  b complex vitamins tablet Take 1 tablet by mouth every morning.    Yes Historical Provider, MD  cetirizine (ZYRTEC) 10 MG tablet Take 10 mg by mouth daily as needed for allergies.   Yes Historical Provider, MD  clotrimazole-betamethasone (LOTRISONE) cream APPLY TOPICALLY TWICE A DAY FOR GROIN RASH 04/10/15  Yes Zenia Resides, MD  levothyroxine (SYNTHROID, LEVOTHROID) 50 MCG tablet Take 50 mcg by mouth daily before breakfast.   Yes  Historical Provider, MD  losartan (COZAAR) 100 MG tablet Take 100 mg by mouth every morning.    Yes Historical Provider, MD  magnesium oxide (MAG-OX) 400 MG tablet Take 1 tablet (400 mg total) by mouth daily. 11/12/14  Yes Zenia Resides, MD  memantine (NAMENDA) 10 MG tablet Take 10 mg by mouth 2 (two) times daily.  03/04/15  Yes Historical Provider, MD  PARoxetine (PAXIL) 10 MG tablet Take 1 tablet (10 mg total) by mouth daily. 04/18/15  Yes Zenia Resides, MD  propranolol ER (INDERAL LA) 80 MG 24 hr capsule Take 1 capsule (80 mg total) by mouth daily. 08/03/14  Yes Zenia Resides, MD  ranitidine (ZANTAC) 150 MG tablet Take 150 mg by mouth daily after lunch.    Yes Historical Provider, MD  traMADol (ULTRAM) 50 MG tablet Take 1 tablet (50 mg total) by mouth every 8 (eight) hours as needed. 05/14/15  Yes  Owens Shark, NP  atorvastatin (LIPITOR) 10 MG tablet TAKE 1 TABLET (10 MG TOTAL) BY MOUTH DAILY TO LOWER CHOLESTEROL Patient not taking: Reported on 05/26/2015 10/30/14   Zenia Resides, MD  Memantine HCl-Donepezil HCl [facility]) 28-10 MG CP24 Take 1 capsule by mouth at bedtime. Patient not taking: Reported on 05/26/2015 05/01/15   Melvenia Beam, MD   Allergies  Allergen Reactions  . Amoxicillin Anaphylaxis  . Erythromycin Ethylsuccinate Other (See Comments)    REACTION: UNKNOWN REACTION POSSIBLE HIVES PER FAMILY  . Remeron [Mirtazapine] Other (See Comments)    REACTION: PER FAMILY PATIENT BECAME DISORIENTED, LETHARGIC, TOOK DAYS TO WEAR OFF  . Simvastatin Other (See Comments)    REACTION: tried on multiple statins and had muscle pain  . Sulfamethoxazole Other (See Comments)    REACTION: UNKNOWN  . Tetracycline Hcl Other (See Comments)    REACTION: unspecified   CBC:    Component Value Date/Time   WBC 12.1* 05/27/2015 0953   WBC 6.0 03/16/2013 1449   HGB 8.4* 05/27/2015 0953   HGB 12.6* 03/16/2013 1449   HCT 25.1* 05/27/2015 0953   HCT 37.4* 03/16/2013 1449   PLT 240 05/27/2015  0953   PLT 196 03/16/2013 1449   MCV 89.0 05/27/2015 0953   MCV 91.2 03/16/2013 1449   NEUTROABS 11.7* 05/26/2015 1737   NEUTROABS 3.3 03/16/2013 1449   LYMPHSABS 2.1 05/26/2015 1737   LYMPHSABS 2.0 03/16/2013 1449   MONOABS 0.7 05/26/2015 1737   MONOABS 0.5 03/16/2013 1449   EOSABS 0.0 05/26/2015 1737   EOSABS 0.2 03/16/2013 1449   BASOSABS 0.1 05/26/2015 1737   BASOSABS 0.0 03/16/2013 1449   Comprehensive Metabolic Panel:    Component Value Date/Time   NA 141 05/27/2015 0628   NA 141 05/14/2015 1624   K 3.4* 05/27/2015 0628   K 4.0 05/14/2015 1624   CL 108 05/27/2015 0628   CL 101 03/16/2013 1449   CO2 27 05/27/2015 0628   CO2 28 05/14/2015 1624   BUN 20 05/27/2015 0628   BUN 13.0 05/14/2015 1624   CREATININE 0.92 05/27/2015 0628   CREATININE 0.9 05/14/2015 1624   CREATININE 0.75 04/10/2015 1040   GLUCOSE 107* 05/27/2015 0628   GLUCOSE 113 05/14/2015 1624   GLUCOSE 159* 03/16/2013 1449   CALCIUM 8.4* 05/27/2015 0628   CALCIUM 10.1 05/14/2015 1624   AST 19 05/26/2015 1240   AST 21 05/14/2015 1624   ALT 11* 05/26/2015 1240   ALT 15 05/14/2015 1624   ALKPHOS 49 05/26/2015 1240   ALKPHOS 76 05/14/2015 1624   BILITOT 0.2* 05/26/2015 1240   BILITOT 0.35 05/14/2015 1624   PROT 6.0* 05/26/2015 1240   PROT 7.6 05/14/2015 1624   ALBUMIN 2.7* 05/26/2015 1240   ALBUMIN 3.1* 05/14/2015 1624    Physical Exam:  Vital Signs: BP 121/57 mmHg  Pulse 58  Temp(Src) 97.2 F (36.2 C) (Oral)  Resp 18  Ht 6\' 2"  (1.88 m)  Wt 81 kg (178 lb 9.2 oz)  BMI 22.92 kg/m2  SpO2 100% SpO2: SpO2: 100 % O2 Device: O2 Device: Nasal Cannula O2 Olsen Rate: O2 Olsen Rate (L/min): 2 L/min Intake/output summary:  Intake/Output Summary (Last 24 hours) at 05/27/15 1031 Last data filed at 05/27/15 0900  Gross per 24 hour  Intake   2913 ml  Output      0 ml  Net   2913 ml   LBM: Last BM Date: 05/26/15 Baseline Weight: Weight: 77.8 kg (171 lb 8.3 oz) Most recent weight:  Weight: 81 kg (178 lb  9.2 oz)  Exam Findings:   General: ill appearing, NAD CVS: RRR Resp: decreased in bases Extrem: without edema Neuro: open eyes to verbal stimuli, unable to follow commands           Palliative Performance Scale: 20  %                Additional Data Reviewed: Recent Labs     05/26/15  1325  05/26/15  1737  05/27/15  0628  05/27/15  0953  WBC   --   14.5*   --   12.1*  HGB  7.5*  8.2*   --   8.4*  PLT   --   276   --   240  NA  139   --   141   --   BUN  39*   --   20   --   CREATININE  0.90   --   0.92   --      Time In: 0830 Time Out: 0945 Time Total: 75 min  Greater than 50%  of this time was spent counseling and coordinating care related to the above assessment and plan.  Discussed with  Dr Andria Frames  Signed by: Wadie Lessen, NP  Knox Royalty, NP  05/27/2015, 10:31 AM  Please contact Palliative Medicine Team phone at [phone] for questions and concerns.   See AMION for contact information

## 2015-05-27 NOTE — Transfer of Care (Signed)
Immediate Anesthesia Transfer of Care Note  Patient: Nicholas Olsen  Procedure(s) Performed: Procedure(s): ESOPHAGOGASTRODUODENOSCOPY (EGD) (N/A)  Patient Location: PACU and Endoscopy Unit  Anesthesia Type:MAC  Level of Consciousness: sedated, patient cooperative and responds to stimulation  Airway & Oxygen Therapy: Patient Spontanous Breathing and Patient connected to nasal cannula oxygen  Post-op Assessment: Report given to RN, Post -op Vital signs reviewed and stable and Patient moving all extremities  Post vital signs: Reviewed and stable  Last Vitals:  Filed Vitals:   05/27/15 1146  BP: 124/60  Pulse: 61  Temp: 36.7 C  Resp: 19    Complications: No apparent anesthesia complications

## 2015-05-27 NOTE — Anesthesia Postprocedure Evaluation (Signed)
  Anesthesia Post-op Note  Patient: Nicholas Olsen  Procedure(s) Performed: Procedure(s): ESOPHAGOGASTRODUODENOSCOPY (EGD) (N/A)  Patient Location: Endoscopy Unit  Anesthesia Type:MAC  Level of Consciousness: awake, alert  and oriented  Airway and Oxygen Therapy: Patient Spontanous Breathing  Post-op Pain: none  Post-op Assessment: Post-op Vital signs reviewed and Patient's Cardiovascular Status Stable              Post-op Vital Signs: Reviewed and stable  Last Vitals:  Filed Vitals:   05/27/15 1146  BP: 124/60  Pulse: 61  Temp: 36.7 C  Resp: 19    Complications: No apparent anesthesia complications

## 2015-05-27 NOTE — Care Management Note (Signed)
Case Management Note  Patient Details  Name: Nicholas Olsen MRN: [id number] Date of Birth: 1934-03-26  Subjective/Objective:    Adm w melena                Action/Plan: lives w wife, pcp dr Madison Hickman   Expected Discharge Date:                  Expected Discharge Plan:  Home/Self Care  In-House Referral:     Discharge planning Services     Post Acute Care Choice:    Choice offered to:     DME Arranged:    DME Agency:     HH Arranged:    [place] Agency:     Status of Service:     Medicare Important Message Given:    Date Medicare IM Given:    Medicare IM give by:    Date Additional Medicare IM Given:    Additional Medicare Important Message give by:     If discussed at [place] of Stay Meetings, dates discussed:    Additional Comments: ur review done  Lacretia Leigh, RN 05/27/2015, 10:43 AM

## 2015-05-27 NOTE — Op Note (Addendum)
[facility] [place], [id number]   OPERATIVE PROCEDURE REPORT  PATIENT :Nicholas Olsen, Nicholas Olsen  MR#: [id number] BIRTHDATE :[birth year] GENDER: male ENDOSCOPIST: Edmonia James, MD ASSISTANT:   Earnstine Regal, RN & Despina Pole, technician. PROCEDURE DATE: 05/27/2015 PRE-PROCEDURE PREPERATION: Patient fasted for 4 hours prior to procedure. PRE-PROCEDURE PHYSICAL: Patient has stable vital signs.  Neck is supple.  There is no JVD, thyromegaly or LAD.  Chest clear to auscultation.  S1 and S2 regular.  Abdomen soft, non-distended, non-tender with NABS. PROCEDURE:     EGD with antral biopsies. 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 MEDICATIONS:     Monitored anesthesia care TOPICAL ANESTHETIC:   none  DESCRIPTION OF PROCEDURE: After the risks benefits and alternatives of the procedure were thoroughly explained, informed consent was obtained.  The Pentax Gastroscope [id number]  was introduced through the mouth and advanced to the second portion of the duodenum , without limitations. The instrument was slowly withdrawn as the mucosa was fully examined. Estimated blood loss is zero unless otherwise noted in this procedure report.      ESOPHAGUS: The esophagus appeared widely patent and the mucosa of the esophagus appeared normal. STOMACH: Two massive ulcers noted in the proximal stomach along the lesser and greater curvature with old heme in the ulcer with edematous gastric folds. Another small antral ulcer with nodular margins-biopsied. One large 9 mm sessile polyps noted in the pre-pyloric region. DUODENUM: The duodenal mucosa showed no abnormalities. Retroflexed views revealed an ulcer. The scope was then withdrawn from the patient and the procedure terminated. The patient tolerated the procedure  without immediate complications.  IMPRESSION: Multiple gastric ulcers [with 2 ulcers in proximal stomach and one in the antrum] with an isolated antral polyp noted-antral biospies done. Normal appearing esophagus and proximal small bowel.-I suspect this represents significant progression of his gastric lymphoma.  RECOMMENDATIONS:     Continue current medications.  REPEAT EXAM:  no repeat exam planned .  DISCHARGE INSTRUCTIONS: standard instructions given.  _______________________________ eSignedEdmonia James, MD 29-May-2015 1:08 PM Revised: 29-May-2015 1:08 PM  CPT CODES:     69629 Upper gastrointestinal endoscopy including esophagus, stomach, and either the duodenum and/or jejunum as appropriate; with biopsy, single or multiple DIAGNOSIS CODES:     K92.1 Melena D62 Acute posthemorrhagic anemia R63.4 Abnormal weight loss R10.13 Epigastric pain K22.11 Ulcer of esophagus with bleeding K22.10 Ulcer of esophagus without bleeding   The ICD and CPT codes recommended by this software are interpretations from the data that the clinical staff has captured with the software.  The verification of the translation of this report to the ICD and CPT codes and modifiers is the sole responsibility of the health care institution and practicing physician where this report was generated.  [place]. will not be held responsible for the validity of the ICD and CPT codes included on this report.  AMA assumes no liability for data contained or not contained herein. CPT is a Designer, television/film set of the [employer].   CC: Arturo Morton, M.D.  PATIENT NAME:  Nicholas Olsen, Nicholas Olsen MR#: [id number]

## 2015-05-27 NOTE — Consult Note (Signed)
[facility] NEW PATIENT EVALUATION  Name: Nicholas Olsen MRN: [id number]  Date:   05/26/2015           DOB: January 08, 1934  Status: inpatient   CC: Zigmund Gottron, MD  Dr. Julieanne Manson   REFERRING PHYSICIAN: Dr. 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 male who is pleasant 81 year old male who is seen today through the courtesy of Dr. Julieanne Manson of for evaluation of his suspected recurrent large B cell non-Hodgkin's from a involving his stomach.  He was diagnosed in 2012 with diffuse large B-cell lymphoma involving an ulcerated gastric mass.  He received chemotherapy under the direction of Dr. Benay Spice which included CHOP/Rituxan.  He completed chemotherapy on 03/27/2011.  Follow-up endoscopy appeared to be normal.  CT scans of the upper 2013 was without evidence for lymphoma.  Restaging in favor 2013 showed a right lower lobe nodule which on biopsy was diagnostic for B-cell non-Hodgkin's lymphoma.  A staging PET scan on 01/18/2013 showed hypermetabolic activity involving lung nodules and a right jugular lymph node.  He has a history of dementia for the past 4-5 years which has progressed significantly over the past year, according to his wife.  His wife noted that he had melanoma and he was found to be hypotensive and anemic with his hemoglobin dropping to 6.3.  His hemoglobin was 11.2 back in June.  He was admitted through the emergency room.  He also gives a recent history of abdominal discomfort and was seen by Dr. Collene Mares.  A CT scan of the abdomen/pelvis on 05/17/2015 showed abnormal thickening of the gastric wall.  Earlier today the patient underwent upper endoscopy with Dr. Collene Mares and he was noted to have 2 massive ulcers in the proximal stomach along the lesser and greater curvatures.  Multiple biopsies were performed.  He is currently  hemodynamically stable.  According to his wife, he is quite comfortable.  PREVIOUS RADIATION THERAPY: History of prostate seed implant with I-125 back in 1992 with Dr. Alona Bene and Dr. Nila Nephew.  His wife believes that he also had oral radioactive iodine therapy for hyperthyroidism approximate 15-16 years ago.   PAST MEDICAL HISTORY:  has a past medical history of Cancer; Diabetes mellitus; Diffuse large B cell lymphoma (2012); Hyperthyroidism; Radiation (1. 7/00  2. 9/00); History of ETT (02/24/06); Carotid artery occlusion (2/07); PSVT (paroxysmal supraventricular tachycardia); and Hypertension.     PAST SURGICAL HISTORY:  Past Surgical History  Procedure Laterality Date  . Tests      08/04/06 Idl: 80 direct-08/20/06, ABI: 0.85 mild PAD-02/05/05, barium enema-cardiac cath 1/00 normal-cardiolyte no ischemia, EF=43%-10/26/00, CT-head-CXR-ECG-echo EF=55-65%-02/16/05, thyroid scan-US-abdominal-xray-spine  . Cardiac catheterization  2010    at [facility]. No significant CAD.     FAMILY HISTORY: family history includes Coronary artery disease in his other.   SOCIAL HISTORY:  reports that he quit smoking about 14 years ago. He has quit using smokeless tobacco. He reports that he does not drink alcohol or use illicit drugs. Married, 3 children and 3 stepchildren.  He worked as a Art gallery manager.   ALLERGIES: Amoxicillin; Erythromycin ethylsuccinate; Remeron; Simvastatin; Sulfamethoxazole; and Tetracycline hcl   MEDICATIONS:  Current Facility-Administered Medications  Medication Dose Route Frequency Provider Last Rate Last Dose  . 0.9 %  sodium chloride infusion   Intravenous Once Olin Hauser, DO      . acetaminophen (TYLENOL) tablet  650 mg  650 mg Oral Q6H PRN Olin Hauser, DO       Or  . acetaminophen (TYLENOL) suppository 650 mg  650 mg Rectal Q6H PRN Olin Hauser, DO      . atorvastatin (LIPITOR) tablet 10 mg  10 mg Oral q1800 Olin Hauser, DO   10 mg  at 05/27/15 1806  . clotrimazole (LOTRIMIN) 1 % cream   Topical BID Olin Hauser, DO      . diphenhydrAMINE (BENADRYL) injection 25 mg  25 mg Intravenous Q6H PRN Veatrice Bourbon, MD   25 mg at 05/27/15 1504  . insulin aspart (novoLOG) injection 0-9 Units  0-9 Units Subcutaneous Q4H Olin Hauser, DO   1 Units at 05/27/15 1804  . levothyroxine (SYNTHROID, LEVOTHROID) tablet 50 mcg  50 mcg Oral QAC breakfast Olin Hauser, DO   50 mcg at 05/27/15 [phone]  . memantine (NAMENDA) tablet 10 mg  10 mg Oral BID Olin Hauser, DO   10 mg at 05/27/15 7619  . ondansetron (ZOFRAN) tablet 4 mg  4 mg Oral Q6H PRN Olin Hauser, DO       Or  . ondansetron [facility]) injection 4 mg  4 mg Intravenous Q6H PRN Olin Hauser, DO   4 mg at 05/27/15 1300  . pantoprazole (PROTONIX) 80 mg in sodium chloride 0.9 % 250 mL (0.32 mg/mL) infusion  8 mg/hr Intravenous Continuous Olin Hauser, DO 25 mL/hr at 05/26/15 2107 8 mg/hr at 05/26/15 2107  . [START ON 05/30/2015] pantoprazole (PROTONIX) injection 40 mg  40 mg Intravenous Q12H Olin Hauser, DO      . PARoxetine (PAXIL) tablet 10 mg  10 mg Oral Daily Olin Hauser, DO   10 mg at 05/27/15 5093  . sodium chloride 0.9 % injection 3 mL  3 mL Intravenous Q12H Olin Hauser, DO   3 mL at 05/26/15 2300     REVIEW OF SYSTEMS:  Pertinent items are noted in HPI.    PHYSICAL EXAM:  height is 6\' 2"  (1.88 m) and weight is 178 lb (80.74 kg). His oral temperature is 97.9 F (36.6 C). His blood pressure is 115/64 and his pulse is 69. His respiration is 17 and oxygen saturation is 100%.   Alert and in no acute distress.  He is unable to respond appropriately to any questions.  Head neck examination: Grossly unremarkable.  Nodes: There is no palpable cervical, supraclavicular, or axillary lymphadenopathy.  Abdomen: Without masses organomegaly.   LABORATORY DATA:  Lab Results  Component  Value Date   WBC 11.6* 05/27/2015   HGB 9.0* 05/27/2015   HCT 27.7* 05/27/2015   MCV 89.6 05/27/2015   PLT 236 05/27/2015   Lab Results  Component Value Date   NA 141 05/27/2015   K 3.4* 05/27/2015   CL 108 05/27/2015   CO2 27 05/27/2015   Lab Results  Component Value Date   ALT 11* 05/26/2015   AST 19 05/26/2015   ALKPHOS 49 05/26/2015   BILITOT 0.2* 05/26/2015      IMPRESSION: I suspect that he has recurrent non-Hodgkin's lymphoma involving his stomach.  I had a lengthy conversation with the patient's wife and his 2 daughters outlining various options.  One would be supportive care and make a decision as to whether not to support him hemodynamically with transfusions should he have recurrent bleeding.  Another option would be to give him 2 weeks of radiation therapy to his  stomach (2000 cGy in 10 sessions) which will hopefully control his bleeding for at least a few months.  This could be repeated after 2-4 week rest.  We would need to limit his daily dose to no more than 200 cGy because of the risk for gastric perforation from rapid tumor regression since lymphoma is  radiosensitive.  We discussed the potential acute and late toxicities which could include nausea/vomiting, and gastric perforation.  They will be visited by hospice/palliative care tomorrow, and try to reach a decision.  I told the family that there is no "right decision".   PLAN: As discussed above.  I will contact the patient's wife, [name], tomorrow.  I spent 30 minutes minutes face to face with the patient and more than 50% of that time was spent in counseling and/or coordination of care.

## 2015-05-28 ENCOUNTER — Ambulatory Visit
Admission: RE | Admit: 2015-05-28 | Discharge: 2015-05-28 | Disposition: A | Discharge status: Home / Self Care | Payer: Medicare PPO | Source: Ambulatory Visit | Attending: Radiation Oncology | Admitting: Radiation Oncology

## 2015-05-28 ENCOUNTER — Encounter (HOSPITAL_COMMUNITY): Payer: Self-pay | Admitting: Gastroenterology

## 2015-05-28 DIAGNOSIS — E440 Moderate protein-calorie malnutrition: Secondary | ICD-10-CM | POA: Insufficient documentation

## 2015-05-28 DIAGNOSIS — F0391 Unspecified dementia with behavioral disturbance: Secondary | ICD-10-CM

## 2015-05-28 DIAGNOSIS — R531 Weakness: Secondary | ICD-10-CM | POA: Insufficient documentation

## 2015-05-28 DIAGNOSIS — Z7189 Other specified counseling: Secondary | ICD-10-CM

## 2015-05-28 LAB — CBC
HEMATOCRIT: 24.3 % — AB (ref 39.0–52.0)
HEMOGLOBIN: 8 g/dL — AB (ref 13.0–17.0)
MCH: 30 pg (ref 26.0–34.0)
MCHC: 32.9 g/dL (ref 30.0–36.0)
MCV: 91 fL (ref 78.0–100.0)
Platelets: 219 10*3/uL (ref 150–400)
RBC: 2.67 MIL/uL — AB (ref 4.22–5.81)
RDW: 17.7 % — ABNORMAL HIGH (ref 11.5–15.5)
WBC: 10.1 10*3/uL (ref 4.0–10.5)

## 2015-05-28 LAB — BASIC METABOLIC PANEL
ANION GAP: 9 (ref 5–15)
BUN: 10 mg/dL (ref 6–20)
CALCIUM: 8.3 mg/dL — AB (ref 8.9–10.3)
CO2: 22 mmol/L (ref 22–32)
Chloride: 108 mmol/L (ref 101–111)
Creatinine, Ser: 0.83 mg/dL (ref 0.61–1.24)
Glucose, Bld: 98 mg/dL (ref 65–99)
POTASSIUM: 3.3 mmol/L — AB (ref 3.5–5.1)
Sodium: 139 mmol/L (ref 135–145)

## 2015-05-28 LAB — GLUCOSE, CAPILLARY
GLUCOSE-CAPILLARY: 71 mg/dL (ref 65–99)
GLUCOSE-CAPILLARY: 74 mg/dL (ref 65–99)
GLUCOSE-CAPILLARY: 85 mg/dL (ref 65–99)
GLUCOSE-CAPILLARY: 93 mg/dL (ref 65–99)
Glucose-Capillary: 97 mg/dL (ref 65–99)
Glucose-Capillary: 122 mg/dL — ABNORMAL HIGH (ref 65–99)

## 2015-05-28 LAB — TYPE AND SCREEN
ABO/RH(D): B POS
ANTIBODY SCREEN: NEGATIVE
Unit division: 0

## 2015-05-28 MED ORDER — BOOST / RESOURCE BREEZE PO LIQD
1.0000 | Freq: Three times a day (TID) | ORAL | Status: DC
  Administered 2015-05-28 – 2015-06-04 (×16): 1 via ORAL

## 2015-05-28 MED ORDER — NYSTATIN 100000 UNIT/GM EX POWD
Freq: Two times a day (BID) | CUTANEOUS | Status: DC
  Administered 2015-05-28 – 2015-06-03 (×14): via TOPICAL
  Filled 2015-05-28 (×2): qty 15

## 2015-05-28 MED ORDER — POTASSIUM CHLORIDE CRYS ER 20 MEQ PO TBCR
20.0000 meq | EXTENDED_RELEASE_TABLET | Freq: Once | ORAL | Status: AC
  Administered 2015-05-28: 20 meq via ORAL
  Filled 2015-05-28: qty 1
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
## 2015-05-28 NOTE — Progress Notes (Signed)
Family Medicine Teaching Service Daily Progress Note Intern Pager: [phone]  Patient name: Nicholas Olsen Medical record number: [id number] Date of birth: 1933/11/20 Age: 81 y.o. Gender: male  Primary Care Provider: Zigmund Gottron, MD Consultants:  GI Oncology Palliative care  Code Status: Full  Pt Overview and Major Events to Date:  8/14: Admitted for melena and near syncopal event concerning for upper GI bleep 8/15: EGD--> multiple ulcerative gastric lesions concerning for worsening lymphoma tumor burden. Meeting with Primary oncologist and radiation oncologist for consideration of palliative radiation 8/16: Family meeting with Palliativecare  Assessment and Plan:  Nicholas Olsen is a 81 y.o. male presenting with black tarry stools and near syncope event, consistent with presumed UGIB with symptomatic ABLA. Initially at [facility], transferred to [facility]. PMH is significant for Non- Hogkins B cell lymphoma, remote h/o prostate cancer, HTN, DM2, HLD  Presumed Upper GI Bleed- concerning to be secondary to Lymophoma recurrence- stable with stable Hgb, no more melanotic stools - EGD--> multiple gastric lesions concerning for progressing Lymphoma - S/p 2 units of blood follow post transfusion Hct--> post transfusion Hct stable - Protonic drip - Trend CBCs, especially if evidence of on going bleeding  - Hold home ASA  B cell lymphoma- EGD concerning for worsening tumor burden - Onc primary following, Rad Onc consulted for consideration of palliative radiation - If pursues therapy will need transfer to [facility] - Palliative care family meeting today, 8/16 - concern for malnutrition associated- nutrition c/s  Concern for stroke- CT head negative for stroke. Pt's wife feels right facial droop improving - Pt's family does not wish to pursue further stroke work up and state they are most interested in pursuing comfort care - Hold home ASA - PT/OT when stable   Mild hypokalemia- K 3.4 - S/p  repletion, f/u labs pending  Hypotension- Improved with good perfusion after fluid repletion and 2 u pRBS - continue to hold anti hypertensives  Hypothyrodiism- repeat TSH 3.13 wnl - continue home synthroid  DM2- stable - SSI - CBG q6  Dementia- Progressing, at risk for sun downing - Continue home Namenda - Reduce stimuli, sitter if shows agitation, low dose haldol. Will transfer to floor to reduce stmulation  Palliative: - Pt's wife, [name], expressed the family's wishes to pursue comfort care - They desire to make him DNR  FEN/GI:clears, consider transition to reg diet today 8/16, NS 150 ml/hr PPx: SCDs as tolerated  Disposition: Transfer to floor. Then anticipate transfer to [facility] to radiation oncology  Subjective:  Pt more interactive, sleeping on arrival but awoken  Objective: Temp:  [97.5 F (36.4 C)-98.1 F (36.7 C)] 97.8 F (36.6 C) (08/16 0400) Pulse Rate:  [32-74] 58 (08/16 0800) Resp:  [12-27] 16 (08/16 0800) BP: (92-124)/(54-78) 112/61 mmHg (08/16 0800) SpO2:  [68 %-100 %] 98 % (08/16 0800) Weight:  [178 lb (80.74 kg)] 178 lb (80.74 kg) (08/15 1146) Physical Exam:  General:NAD, comfortable appearing HEENT: PERRL, MMM. Improved right facial droop Cardiovascular: RRR, no murmurs auscultated Respiratory: CTAB, no wheezes or crackles Abdomen: soft, difficult to ascertain if tenderness was appreciated secondary to mental status, no masses palpated, + BS, no hepato-splenomegally Extremities: No LE edema, WWP, 1+ PD Skin: No rashes or lesions   Laboratory:  Recent Labs Lab 05/27/15 0953 05/27/15 1618 05/27/15 2209  WBC 12.1* 11.6* 10.2  HGB 8.4* 9.0* 8.3*  HCT 25.1* 27.7* 25.1*  PLT 240 236 225    Recent Labs Lab 05/26/15 1240 05/26/15 1303 05/26/15 1325 05/27/15 0962  NA 140 139 139 141  K 4.3 4.2 4.3 3.4*  CL 103 101 102 108  CO2 29  --   --  27  BUN 38* 35* 39* 20  CREATININE 0.97 1.00 0.90 0.92  CALCIUM 9.3  --   --  8.4*  PROT 6.0*   --   --   --   BILITOT 0.2*  --   --   --   ALKPHOS 49  --   --   --   ALT 11*  --   --   --   AST 19  --   --   --   GLUCOSE 182* 178* 178* 107*    Lactic Acid: 1.10  Imaging/Diagnostic Tests: 8/14  CT Head w/o contrast IMPRESSION: 1. Stable atrophy and diffuse white matter disease. 2. No acute intracranial abnormality.  CXR Portable 1v IMPRESSION: Stable bilateral lung masses. No acute abnormality is noted.  Veatrice Bourbon, MD 05/28/2015, 8:44 AM PGY-2, [facility] Intern pager: [phone], text pages welcome

## 2015-05-28 NOTE — Progress Notes (Signed)
CC: Dr. Julieanne Manson   Radiation Oncology Progress Note:  I spoke with Dr. Mali Rund of clinical pathology he feels certain that we are dealing with a malignant process which would be consistent with lymphoma.  He is having Dr. Gari Crown review the slides to determine whether there needs to be special staining for confirmation in addition to histologic appearance.  Dr. Donato Heinz is certain that we have malignant process, and my recommendation for 2 weeks of radiation therapy remains unchanged.  I spoke with the patient's wife this morning if who is interested in proceeding with 2 weeks of palliative radiotherapy with our goal to prevent or delay future GI bleeding.  I'm prepared to move ahead with CT simulation when he is transferred to the [facility] (question today or tomorrow).  We can begin his radiation therapy this week.

## 2015-05-28 NOTE — Progress Notes (Addendum)
Initial Nutrition Assessment  DOCUMENTATION CODES:   Non-severe (moderate) malnutrition in context of chronic illness  INTERVENTION:   Boost Breeze po TID, each supplement provides 250 kcal and 9 grams of protein  NUTRITION DIAGNOSIS:   Increased nutrient needs related to chronic illness as evidenced by estimated needs  GOAL:   Patient will meet greater than or equal to 90% of their needs  MONITOR:   PO intake, Supplement acceptance, Labs, Weight trends, I & O's  REASON FOR ASSESSMENT:   Consult, Low Braden Poor PO  ASSESSMENT:   81 y.o. Male with PMH of large B cell lymphoma, DM2, hyperthyroidism, HTN who presents with ongoing melena. Wife reported melena and pre-syncope yesterday. Came to ED due to profuse dark stools. Brought by EMS, found to by hypotensive, received NS in route. Hgb in ED was 6.3. GI consulted. FPTS called for admission. Recent CT abdomen 8/5 revealed growth of mass on lesser curvature of stomach.  RD spoke briefly with patient's wife at bedside.  Reports pt's appetite has been poor.  Also reveals pt has had severe weight loss.  Per readings below, pt has had a 10% weight loss since April 2016.  RD to add oral nutrition supplements.  Radiation Oncology note reviewed.  Plan is for initiation of palliative radiotherapy.  Transfer to [facility] pending.  Low braden score places patient at risk for skin breakdown.  Nutrition-Focused physical exam completed. Findings are moderate fat depletion, moderate muscle depletion, and no edema.   Diet Order:  Diet clear liquid Room service appropriate?: Yes; Fluid consistency:: Thin  Skin:  Reviewed, no issues  Last BM:  8/14  Height:   Ht Readings from Last 1 Encounters:  05/27/15 6\' 2"  (1.88 m)    Weight:   Wt Readings from Last 10 Encounters:  05/27/15 178 lb (80.74 kg)  05/14/15 174 lb 6.4 oz (79.107 kg)  05/01/15 176 lb (79.833 kg)  05/01/15 175 lb (79.379 kg)  04/23/15 181 lb 12.8 oz (82.464 kg)   04/11/15 183 lb 11.2 oz (83.326 kg)  04/11/15 182 lb (82.555 kg)  04/10/15 182 lb 12.8 oz (82.918 kg)  02/22/15 194 lb 6.4 oz (88.179 kg)  01/28/15 198 lb 14.4 oz (90.22 kg)    Ideal Body Weight:  86.3 kg  BMI:  Body mass index is 22.84 kg/(m^2).  Estimated Nutritional Needs:   Kcal:  2000-2200  Protein:  100-110 gm  Fluid:  2.0-2.2 L  EDUCATION NEEDS:   No education needs identified at this time  Arthur Holms, RD, LDN Pager #: [phone] After-Hours Pager #: [phone]

## 2015-05-28 NOTE — Progress Notes (Signed)
New Admission Note:   Arrival Method: Via [facility]; Patient transfer from [facility] ICU Mental Orientation: Alert & Oriented Telemetry: No Assessment: Completed Skin: Dry & Intact IV: Infusing L Forearm Pain: Patient denies of any pain Tubes: None Safety Measures: Safety Fall Prevention Plan has been given, discussed and signed Admission: Completed [facility] Orientation: Patient has been orientated to the room, unit and staff.  Family: Wife at bedside  Orders have been reviewed and implemented. Will continue to monitor the patient. Call light has been placed within reach and bed alarm has been activated.   Nonie Hoyer BSN, RN Phone number: [phone]

## 2015-05-28 NOTE — Progress Notes (Signed)
PT Cancellation Note  Patient Details Name: Nicholas Olsen MRN: [id number] DOB: 1934-02-11   Cancelled Treatment:    Reason Eval/Treat Not Completed: Fatigue/lethargy limiting ability to participate (pt sleeping on arrival, able to arouse but only briefly and not able to fully participate at this time. Wife and dgtr present stating pt normally independent and did not sleep last night. Will attempt next date)   Nicholas Olsen 05/28/2015, 10:27 AM Elwyn Reach, [place]

## 2015-05-28 NOTE — Progress Notes (Signed)
Daily Progress Note   Patient Name: Nicholas Olsen       Date: 05/28/2015 DOB: November 05, 1933  Age: 81 y.o. MRN#: [id number] Attending Physician: Alveda Reasons, MD Primary Care Physician: Zigmund Gottron, MD Admit Date: 05/26/2015  Reason for Consultation/Follow-up: Establishing goals of care and Psychosocial/spiritual support  Subjective:     -continued conversation regarding diagnosis, prognosis, GOC and treatment options with wife and daughter at bedside  -Dr Andria Frames currently in room, family very much look to him for direction in decision making  -family tell me the decision is to move forward with offered radiation and to continue to treat the treatable  -His short term prognosis is poor regardless of offered medical interventions. I encouraged family to consider the patient as a whole, and consider  what are his personnel values as it relates to his quality of life, always consider what is the intention of all medical interventions    -Hard Choices booklet left for review, family is encouraged to call with questions or concerns  Length of Stay: 2 days  Current Medications: Scheduled Meds:  . sodium chloride   Intravenous Once  . atorvastatin  10 mg Oral q1800  . clotrimazole   Topical BID  . insulin aspart  0-9 Units Subcutaneous Q4H  . levothyroxine  50 mcg Oral QAC breakfast  . memantine  10 mg Oral BID  . [START ON 05/30/2015] pantoprazole (PROTONIX) IV  40 mg Intravenous Q12H  . PARoxetine  10 mg Oral Daily  . potassium chloride  20 mEq Oral Once  . sodium chloride  3 mL Intravenous Q12H    Continuous Infusions: . pantoprozole (PROTONIX) infusion 8 mg/hr (05/28/15 0022)    PRN Meds: acetaminophen **OR** acetaminophen, diphenhydrAMINE, ondansetron **OR** ondansetron (ZOFRAN) IV  Palliative Performance Scale: 30 % at best     Vital Signs: BP 112/61 mmHg  Pulse 58  Temp(Src) 97.8 F (36.6 C) (Oral)  Resp 16  Ht 6\' 2"  (1.88 m)  Wt 80.74 kg (178 lb)   BMI 22.84 kg/m2  SpO2 98% SpO2: SpO2: 98 % O2 Device: O2 Device: Nasal Cannula O2 Flow Rate: O2 Flow Rate (L/min): 2 L/min  Intake/output summary:  Intake/Output Summary (Last 24 hours) at 05/28/15 0937 Last data filed at 05/28/15 0700  Gross per 24 hour  Intake   1680 ml  Output    600 ml  Net   1080 ml   LBM: Last BM Date: 05/26/15 Baseline Weight: Weight: 77.8 kg (171 lb 8.3 oz) Most recent weight: Weight: 80.74 kg (178 lb)  Physical Exam:          General: ill appearing, confused  HEENT: moist buccal membranes CVS: RRR Resp: decreased in bases Neuro:unable to follow commands   Additional Data Reviewed: Recent Labs     05/26/15  1325   05/27/15  0628   05/27/15  1618  05/27/15  2209  WBC   --    < >   --    < >  11.6*  10.2  HGB  7.5*   < >   --    < >  9.0*  8.3*  PLT   --    < >   --    < >  236  225  NA  139   --   141   --    --    --   BUN  39*   --   20   --    --    --  CREATININE  0.90   --   0.92   --    --    --    < > = values in this interval not displayed.     Problem List:  Patient Active Problem List   Diagnosis Date Noted  . Palliative care encounter 05/27/2015  . DNR (do not resuscitate) discussion 05/27/2015  . Gastrointestinal hemorrhage with melena   . Pre-syncope   . Dementia   . Melena 05/26/2015  . Pressure ulcer 05/26/2015  . GI bleed 05/26/2015  . Acute upper GI bleed 05/26/2015  . Tinea cruris 04/10/2015  . Loss of weight 04/10/2015  . Hypomagnesemia 11/12/2014  . Rotator cuff syndrome of right shoulder 02/09/2014  . Muscle spasm of back 01/23/2014  . Cramps of lower extremity 10/25/2013  . Skin benign neoplasm 05/19/2013  . Hypothyroidism 04/07/2013  . Diabetes 02/08/2013  . Benign essential tremor 02/08/2013  . Vascular dementia, uncomplicated 33/29/5188  . Reflux esophagitis 08/20/2011  . Diffuse large B cell lymphoma   . OSTEOPENIA 01/04/2009  . ALLERGIC RHINITIS 02/06/2008  . PROSTATE CANCER 12/09/2006  . Type  II diabetes mellitus with peripheral circulatory disorder 12/09/2006  . HYPERCHOLESTEROLEMIA 12/09/2006  . HYPERTENSION, BENIGN SYSTEMIC 12/09/2006  . TACHYCARDIA, PAROXYSMAL SUPRAVENTRICULAR 12/09/2006  . CLAUDICATION, INTERMITTENT 12/09/2006  . NEPHROLITHIASIS 12/09/2006  . CERVICAL SPINE DISORDER, NOS 12/09/2006     Palliative Care Assessment & Plan    Code Status:  DNR  Goals of Care:  At this time family is hopeful to prolong quality of life and are open to all offered and available medical interventions to prolong life.     Prognosis: < 6 months--I worry that his prognosis is likely weeks to a month or two even with life prolonging interventions, ie radiation and transfusions  5. Discharge Planning:  Pending outcomes,  This is a great worry to family unsure of "what the future holds" and how the current treatment plan affects discharge options  Care plan was discussed with Dr Andria Frames and Dr Lincoln Brigham  Thank you for allowing the Palliative Medicine Team to assist in the care of this patient.   Time In: 0900 Time Out: 0935 Total Time 35 min Prolonged Time Billed  no     Greater than 50%  of this time was spent counseling and coordinating care related to the above assessment and plan.     Knox Royalty, NP  05/28/2015, 9:37 AM  Please contact Palliative Medicine Team phone at [phone] for questions and concerns.

## 2015-05-28 NOTE — Anesthesia Postprocedure Evaluation (Signed)
  Anesthesia Post-op Note  Patient: Nicholas Olsen  Procedure(s) Performed: Procedure(s): ESOPHAGOGASTRODUODENOSCOPY (EGD) (N/A)  Patient Location: Endoscopy Unit  Anesthesia Type:MAC  Level of Consciousness: awake and alert   Airway and Oxygen Therapy: Patient Spontanous Breathing  Post-op Pain: none  Post-op Assessment: Post-op Vital signs reviewed              Post-op Vital Signs: Reviewed and stable  Last Vitals:  Filed Vitals:   05/28/15 1300  BP: 131/51  Pulse: 64  Temp:   Resp: 20    Complications: No apparent anesthesia complications

## 2015-05-28 NOTE — Clinical Documentation Improvement (Signed)
Please further specify the degree of malnutrition if known and document findings in next progress note and include in discharge summary if applicable.  . Severity: --Mild (first degree) --Moderate (second degree) --Severe (third degree)  Supporting Information:   Abnormal weight loss is documented in 05/27/15 Op Note  Patient with history of increasing tumor burden from B cell lymphoma  Patient is 6"2" tall, weighing 178 pounds; BMI is 23  Thank You, Norm Parcel RN, BSN Clinical Documentation Improvement [phone]; cell : [phone]

## 2015-05-29 ENCOUNTER — Ambulatory Visit
Admit: 2015-05-29 | Discharge: 2015-05-29 | Disposition: A | Discharge status: Home / Self Care | Payer: Medicare PPO | Source: Ambulatory Visit | Attending: Radiation Oncology | Admitting: Radiation Oncology

## 2015-05-29 DIAGNOSIS — C8330 Diffuse large B-cell lymphoma, unspecified site: Secondary | ICD-10-CM

## 2015-05-29 DIAGNOSIS — Z510 Encounter for antineoplastic radiation therapy: Secondary | ICD-10-CM | POA: Insufficient documentation

## 2015-05-29 LAB — GLUCOSE, CAPILLARY
GLUCOSE-CAPILLARY: 137 mg/dL — AB (ref 65–99)
GLUCOSE-CAPILLARY: 88 mg/dL (ref 65–99)
Glucose-Capillary: 116 mg/dL — ABNORMAL HIGH (ref 65–99)
Glucose-Capillary: 80 mg/dL (ref 65–99)
Glucose-Capillary: 96 mg/dL (ref 65–99)
Glucose-Capillary: 97 mg/dL (ref 65–99)

## 2015-05-29 MED ORDER — TRAZODONE 25 MG HALF TABLET
25.0000 mg | ORAL_TABLET | Freq: Once | ORAL | Status: AC
  Administered 2015-05-30: 25 mg via ORAL
  Filled 2015-05-29: qty 1

## 2015-05-29 MED ORDER — TRAZODONE 25 MG HALF TABLET
25.0000 mg | ORAL_TABLET | Freq: Once | ORAL | Status: AC
  Administered 2015-05-29: 25 mg via ORAL
  Filled 2015-05-29: qty 1

## 2015-05-29 NOTE — Care Management Note (Signed)
Case Management Note  Patient Details  Name: Nicholas Olsen MRN: [id number] Date of Birth: Dec 24, 1933  Subjective/Objective:      81 yo admitted with Melena              Action/Plan: From home with wife  Expected Discharge Date:                  Expected Discharge Plan:  [place]  In-House Referral:     Discharge planning Services  CM Consult  Post Acute Care Choice:    Choice offered to:     DME Arranged:    DME Agency:     HH Arranged:    HH Agency:     Status of Service:  In process, will continue to follow  Medicare Important Message Given:  Yes-second notification given Date Medicare IM Given:    Medicare IM give by:    Date Additional Medicare IM Given:    Additional Medicare Important Message give by:     If discussed at [place] of Stay Meetings, dates discussed:    Additional Comments: PT is recommending HHPT at DC. This CM met with pt and wife at bedside to offer choice for [facility] services. Wife unsure who she would like to use at this time and would like to check with her daughter. [facility] service provider list left with wife. CM will continue to follow. Lynnell Catalan, RN 05/29/2015, 3:04 PM

## 2015-05-29 NOTE — Progress Notes (Signed)
OT Cancellation Note  Patient Details Name: Nicholas Olsen MRN: [id number] DOB: [birth year]   Cancelled Treatment:    Reason Eval/Treat Not Completed: Other (comment) Pt's family states pt to go to radiation in the next 10 minutes or so and requests that OT check back later time.  [place], [place] 05/29/2015, 10:32 AM

## 2015-05-29 NOTE — Progress Notes (Signed)
Clinical Social Work  CSW received inappropriate referral for "Inter-facility transfer for palliative radiation". CSW spoke with PT who reports patient is ambulating well and no facility placement needed. CSW is signing off but available if further needs arise.  [place], [place] [phone]

## 2015-05-29 NOTE — Progress Notes (Signed)
Complex simulation/treatment planning note: the patient was taken to the CT simulator.  He was placed  Supine.  A Vac lock immobilization device was constructed.  His abdomen was then scanned.  The CT data set was sent to the planning system  Where I contoured his stomach.   Dosimetry contoured his remaining anatomy.  The stomach will be expanded by 2.0 cm to create PTV 20.  I'm prescribing 2000 cGy in 10 sessions.  He is now ready for 3-D simulation.

## 2015-05-29 NOTE — Progress Notes (Signed)
Patient ID: Nicholas Olsen, male   DOB: 01-15-1934, 81 y.o.   MRN: [id number]  [facility] PROGRESS NOTE  Nicholas Olsen YQI:[id number] DOB: 1933/11/04 DOA: 05/26/2015 PCP: Zigmund Gottron, MD   Brief narrative:    81 y.o. male presented with melena and near syncope event, consistent with presumed upper GI bleed. He additionally had new right facial droop on presentation concerning for stroke. PMH is significant for Non- Hogkins B cell lymphoma, remote h/o prostate cancer, HTN, DM2, HLD. Pt transferred from [facility] to [facility] for consideration of palliative radiation therapy.   8/14: Admitted for melena and near syncopal event concerning for upper GI bleep 8/15: EGD --> multiple ulcerative gastric lesions concerning for worsening lymphoma tumor burden. Meeting with Primary oncologist and radiation oncologist for consideration of palliative radiation 8/16: Family meeting with Palliativecare  Assessment/Plan:    Presumed Upper GI Bleed - ? Secondary to lymphoma recurrence  - no CBC this AM - will repeat in AM - EGD--> multiple gastric lesions concerning for progressing Lymphoma - S/p 2 units of blood follow post transfusion Hct--> post transfusion Hct stable - Protonix IV BID has been provided  - Hold home ASA  B cell lymphoma- EGD concerning for worsening tumor burden - Onc primary following, Rad Onc consulted for consideration of palliative radiation - appreciate assistance   Concern for stroke - CT head negative for stroke. Pt's wife feels right facial droop improving - Pt's family does not wish to pursue further stroke work up and state they are most interested in pursuing comfort care - Hold home ASA - PT/OT when stable  Mild hypokalemia- K 3.4 - will repeat BMP in AM  Hypotension- Improved with good perfusion after fluid repletion and 2 u pRBC's - continue to hold anti hypertensives  Hypothyrodiism- repeat TSH 3.13 wnl - continue home synthroid  DM2- stable -  SSI - CBG q6  Moderate PCM - encouraged PO intake   Dementia- Progressing, at risk for sun downing - Continue home Namenda - Reduce stimuli, sitter if shows agitation, low dose haldol can be given if needed   Palliative: - Pt's wife, Nicholas Olsen, expressed the family's wishes to pursue comfort care - agree with DNR status   DVT prophylaxis - SCD's  Code Status: DNR Family Communication:  plan of care discussed with the patient, grandson at bedside  Disposition Plan: To be determined   IV access:  Peripheral IV  Procedures and diagnostic studies:    Ct Head Wo Contrast 05/26/2015  Stable atrophy and diffuse white matter disease. 2. No acute intracranial abnormality.    Ct Abdomen Pelvis W Contrast 05/17/2015   Suggestion of abnormal thickening of the gastric wall. Malignant involvement is not excluded.  Interval enlargement of soft tissue mass adjacent to lesser curvature of the stomach within the gastrohepatic ligament. Additional upper abdominal masses are grossly stable.  Grossly stable right lower lobe mass.    Portable Chest 1 View 05/26/2015  Stable bilateral lung masses.  No acute abnormality is noted.    Medical Consultants:  PCT Oncology Radiation oncology   Other Consultants:  PT  IAnti-Infectives:   None   Faye Ramsay, MD  [facility] Pager [phone]  If 7PM-7AM, please contact night-coverage [email or web address] Password [facility] 05/29/2015, 4:38 PM   LOS: 3 days   HPI/Subjective: No events overnight.   Objective: Filed Vitals:   05/28/15 1841 05/28/15 2131 05/29/15 0607 05/29/15 1500  BP: 127/75 147/97 126/70 139/93  Pulse: 58 60 78 96  Temp: 97.5  F (36.4 C) 98 F (36.7 C) 98.4 F (36.9 C) 98.1 F (36.7 C)  TempSrc: Oral Oral Oral Oral  Resp: 18 18 16 18   Height: 6\' 2"  (1.88 m)     Weight: 65.091 kg (143 lb 8 oz)     SpO2: 100% 100% 100% 96%    Intake/Output Summary (Last 24 hours) at 05/29/15 1638 Last data filed at 05/29/15 0607  Gross per 24  hour  Intake    645 ml  Output   1302 ml  Net   -657 ml    Exam:   General:  Pt is alert, follows commands appropriately, not in acute distress  Cardiovascular: Regular rate and rhythm,  no rubs, no gallops  Respiratory: Clear to auscultation bilaterally, no wheezing, no crackles, no rhonchi  Abdomen: Soft, non tender, non distended, bowel sounds present, no guarding  Data Reviewed: Basic Metabolic Panel:  Recent Labs Lab 05/26/15 1240 05/26/15 1303 05/26/15 1325 05/27/15 0628 05/28/15 0958  NA 140 139 139 141 139  K 4.3 4.2 4.3 3.4* 3.3*  CL 103 101 102 108 108  CO2 29  --   --  27 22  GLUCOSE 182* 178* 178* 107* 98  BUN 38* 35* 39* 20 10  CREATININE 0.97 1.00 0.90 0.92 0.83  CALCIUM 9.3  --   --  8.4* 8.3*   Liver Function Tests:  Recent Labs Lab 05/26/15 1240  AST 19  ALT 11*  ALKPHOS 49  BILITOT 0.2*  PROT 6.0*  ALBUMIN 2.7*   CBC:  Recent Labs Lab 05/26/15 1240  05/26/15 1737 05/27/15 0953 05/27/15 1618 05/27/15 2209 05/28/15 0958  WBC 16.4*  --  14.5* 12.1* 11.6* 10.2 10.1  NEUTROABS 14.0*  --  11.7*  --   --   --   --   HGB 6.3*  < > 8.2* 8.4* 9.0* 8.3* 8.0*  HCT 19.7*  < > 24.7* 25.1* 27.7* 25.1* 24.3*  MCV 92.5  --  90.5 89.0 89.6 90.0 91.0  PLT 309  --  276 240 236 225 219  < > = values in this interval not displayed. Cardiac Enzymes:  Recent Labs Lab 05/26/15 2221 05/27/15 0519 05/27/15 1038 05/27/15 2209  TROPONINI <0.03 <0.03 <0.03 <0.03   BNP: Invalid input(s): POCBNP CBG:  Recent Labs Lab 05/28/15 2007 05/29/15 0003 05/29/15 0450 05/29/15 0746 05/29/15 1202  GLUCAP 122* 97 80 137* 116*    Recent Results (from the past 240 hour(s))  MRSA PCR Screening     Status: None   Collection Time: 05/26/15  9:20 PM  Result Value Ref Range Status   MRSA by PCR NEGATIVE NEGATIVE Final    Comment:        The GeneXpert MRSA Assay (FDA approved for NASAL specimens only), is one component of a comprehensive MRSA  colonization surveillance program. It is not intended to diagnose MRSA infection nor to guide or monitor treatment for MRSA infections.      Scheduled Meds: . clotrimazole   Topical BID  . feeding supplement  1 Container Oral TID BM  . insulin aspart  0-9 Units Subcutaneous Q4H  . levothyroxine  50 mcg Oral QAC breakfast  . memantine  10 mg Oral BID  . nystatin   Topical BID  . [START ON 05/30/2015] pantoprazole (PROTONIX) IV  40 mg Intravenous Q12H  . PARoxetine  10 mg Oral Daily  . sodium chloride  3 mL Intravenous Q12H   Continuous Infusions: . pantoprozole (PROTONIX) infusion 8 mg/hr (  05/28/15 2214)          

## 2015-05-29 NOTE — Evaluation (Signed)
Physical Therapy Evaluation Patient Details Name: Nicholas Olsen MRN: [id number] DOB: [birth year] Today's Date: 05/29/2015   History of Present Illness  81 yo M with known PMH of large B cell lymphoma, DM2, dementia, hyperthyroidism, HTN who presents with pre-syncope and melena.   Clinical Impression  Pt admitted with above diagnosis. Pt currently with functional limitations due to the deficits listed below (see PT Problem List). Pt requiring min A for ambulation. He seems to be having increased confusion, likely due to h/o dementia exacerbated by being in hospital. It's expected he'll be able to return home with assist from his wife.  Pt will benefit from skilled PT to increase their independence and safety with mobility to allow discharge to the venue listed below.       Follow Up Recommendations Home health PT    Equipment Recommendations  None recommended by PT    Recommendations for Other Services       Precautions / Restrictions Precautions Precautions: Fall Restrictions Weight Bearing Restrictions: No      Mobility  Bed Mobility Overal bed mobility: Modified Independent             General bed mobility comments: with bedrail  Transfers Overall transfer level: Needs assistance Equipment used: 1 person hand held assist Transfers: Sit to/from Stand Sit to Stand: Min assist         General transfer comment: cues for handplacement, assist to power up  Ambulation/Gait Ambulation/Gait assistance: Min assist Ambulation Distance (Feet): 250 Feet Assistive device: 2 person hand held assist Gait Pattern/deviations: Decreased stride length   Gait velocity interpretation: at or above normal speed for age/gender General Gait Details: steady with min HHA of 2  Stairs            Wheelchair Mobility    Modified Rankin (Stroke Patients Only)       Balance Overall balance assessment: Needs assistance   Sitting balance-Leahy Scale: Good        Standing balance-Leahy Scale: Fair Standing balance comment: UE support needed for walking                             Pertinent Vitals/Pain Pain Assessment: No/denies pain    Home Living Family/patient expects to be discharged to:: Private residence Living Arrangements: Spouse/significant other Available Help at Discharge: Family;Available 24 hours/day Type of Home: House Home Access: Stairs to enter (grandson provided history, he's not sure how many steps to enter)     Home Layout: One level Home Equipment: Environmental consultant - 2 wheels      Prior Function Level of Independence: Independent with assistive device(s)         Comments: per grandson [name], pt usually walks in home without AD, but does use RW at times; pt's wife not present to provide detailed info, she will return to hosptial shortly     Hand Dominance        Extremity/Trunk Assessment   Upper Extremity Assessment: Overall WFL for tasks assessed           Lower Extremity Assessment: Overall WFL for tasks assessed      Cervical / Trunk Assessment: Normal  Communication   Communication: HOH  Cognition Arousal/Alertness: Awake/alert Behavior During Therapy: WFL for tasks assessed/performed Overall Cognitive Status: Impaired/Different from baseline (h/o dementia, exacerbated in hosptial, pt didn't sleep last night per grandson) Area of Impairment: Orientation;Memory Orientation Level: Disoriented to;Place;Time;Situation  General Comments      Exercises        Assessment/Plan    PT Assessment Patient needs continued PT services  PT Diagnosis Generalized weakness;Altered mental status   PT Problem List Decreased activity tolerance;Decreased balance;Decreased safety awareness;Decreased cognition;Decreased mobility  PT Treatment Interventions Gait training;Functional mobility training;Patient/family education;Therapeutic exercise   PT Goals (Current goals can be  found in the Care Plan section) Acute Rehab PT Goals Patient Stated Goal: return home PT Goal Formulation: With family Time For Goal Achievement: 06/12/15 Potential to Achieve Goals: Good    Frequency Min 3X/week   Barriers to discharge        Co-evaluation               End of Session Equipment Utilized During Treatment: Gait belt Activity Tolerance: Patient tolerated treatment well Patient left: in chair;with call bell/phone within reach;with chair alarm set           Time: 7185-5015 PT Time Calculation (min) (ACUTE ONLY): 22 min   Charges:   PT Evaluation $Initial PT Evaluation Tier I: 1 Procedure     PT G Codes:        Philomena Doheny 05/29/2015, 10:11 AM [phone]

## 2015-05-29 NOTE — Care Management Important Message (Signed)
Important Message  Patient Details  Name: Nicholas Olsen MRN: [id number] Date of Birth: 1934-07-16   Medicare Important Message Given:  Yes-second notification given    Camillo Flaming 05/29/2015, 1:29 [place] Message  Patient Details  Name: Nicholas Olsen MRN: [id number] Date of Birth: [birth year]   Medicare Important Message Given:  Yes-second notification given    Camillo Flaming 05/29/2015, 1:29 PM

## 2015-05-30 ENCOUNTER — Encounter: Payer: Self-pay | Admitting: Radiation Oncology

## 2015-05-30 ENCOUNTER — Ambulatory Visit: Admit: 2015-05-30 | Payer: Medicare PPO | Admitting: Radiation Oncology

## 2015-05-30 DIAGNOSIS — C8590 Non-Hodgkin lymphoma, unspecified, unspecified site: Secondary | ICD-10-CM

## 2015-05-30 DIAGNOSIS — R451 Restlessness and agitation: Secondary | ICD-10-CM

## 2015-05-30 LAB — BASIC METABOLIC PANEL
ANION GAP: 9 (ref 5–15)
BUN: <5 mg/dL — ABNORMAL LOW (ref 6–20)
CALCIUM: 8.5 mg/dL — AB (ref 8.9–10.3)
CO2: 25 mmol/L (ref 22–32)
CREATININE: 0.78 mg/dL (ref 0.61–1.24)
Chloride: 105 mmol/L (ref 101–111)
GLUCOSE: 91 mg/dL (ref 65–99)
Potassium: 3.1 mmol/L — ABNORMAL LOW (ref 3.5–5.1)
Sodium: 139 mmol/L (ref 135–145)

## 2015-05-30 LAB — CBC
HCT: 25.9 % — ABNORMAL LOW (ref 39.0–52.0)
Hemoglobin: 8.4 g/dL — ABNORMAL LOW (ref 13.0–17.0)
MCH: 29.4 pg (ref 26.0–34.0)
MCHC: 32.4 g/dL (ref 30.0–36.0)
MCV: 90.6 fL (ref 78.0–100.0)
PLATELETS: 265 10*3/uL (ref 150–400)
RBC: 2.86 MIL/uL — ABNORMAL LOW (ref 4.22–5.81)
RDW: 17.6 % — AB (ref 11.5–15.5)
WBC: 7.7 10*3/uL (ref 4.0–10.5)

## 2015-05-30 LAB — GLUCOSE, CAPILLARY
GLUCOSE-CAPILLARY: 96 mg/dL (ref 65–99)
GLUCOSE-CAPILLARY: 115 mg/dL — AB (ref 65–99)
Glucose-Capillary: 152 mg/dL — ABNORMAL HIGH (ref 65–99)
Glucose-Capillary: 76 mg/dL (ref 65–99)
Glucose-Capillary: 78 mg/dL (ref 65–99)
Glucose-Capillary: 81 mg/dL (ref 65–99)

## 2015-05-30 MED ORDER — MEMANTINE HCL-DONEPEZIL HCL ER 28-10 MG PO CP24: 1.0000 | ORAL_CAPSULE | Freq: Every day | ORAL | Status: DC

## 2015-05-30 MED ORDER — MEMANTINE HCL ER 28 MG PO CP24
28.0000 mg | ORAL_CAPSULE | Freq: Every day | ORAL | Status: DC
  Administered 2015-05-30 – 2015-06-03 (×5): 28 mg via ORAL
  Filled 2015-05-30 (×6): qty 1

## 2015-05-30 MED ORDER — CLONAZEPAM 0.5 MG PO TABS
0.2500 mg | ORAL_TABLET | Freq: Every day | ORAL | Status: DC
  Administered 2015-05-30 – 2015-06-03 (×5): 0.25 mg via ORAL
  Filled 2015-05-30 (×5): qty 1

## 2015-05-30 MED ORDER — POTASSIUM CHLORIDE CRYS ER 20 MEQ PO TBCR
40.0000 meq | EXTENDED_RELEASE_TABLET | Freq: Once | ORAL | Status: AC
  Administered 2015-05-30: 40 meq via ORAL
  Filled 2015-05-30: qty 2

## 2015-05-30 MED ORDER — DONEPEZIL HCL 10 MG PO TABS
10.0000 mg | ORAL_TABLET | Freq: Every day | ORAL | Status: DC
  Administered 2015-05-30 – 2015-06-03 (×5): 10 mg via ORAL
  Filled 2015-05-30 (×7): qty 1

## 2015-05-30 MED ORDER — DONEPEZIL HCL 10 MG PO TABS
10.0000 mg | ORAL_TABLET | Freq: Every day | ORAL | Status: DC
  Filled 2015-05-30: qty 1

## 2015-05-30 MED ORDER — PROPRANOLOL HCL ER 80 MG PO CP24
80.0000 mg | ORAL_CAPSULE | Freq: Every day | ORAL | Status: DC
  Administered 2015-05-30 – 2015-06-03 (×5): 80 mg via ORAL
  Filled 2015-05-30 (×6): qty 1

## 2015-05-30 MED ORDER — INSULIN ASPART 100 UNIT/ML ~~LOC~~ SOLN
0.0000 [IU] | Freq: Three times a day (TID) | SUBCUTANEOUS | Status: DC
  Administered 2015-05-31: 2 [IU] via SUBCUTANEOUS
  Administered 2015-06-01: 3 [IU] via SUBCUTANEOUS
  Administered 2015-06-02: 2 [IU] via SUBCUTANEOUS
  Administered 2015-06-02: 3 [IU] via SUBCUTANEOUS
  Administered 2015-06-03 (×2): 1 [IU] via SUBCUTANEOUS

## 2015-05-30 MED ORDER — DIVALPROEX SODIUM 125 MG PO CSDR
250.0000 mg | DELAYED_RELEASE_CAPSULE | Freq: Every day | ORAL | Status: DC
  Administered 2015-05-30: 250 mg via ORAL
  Filled 2015-05-30 (×2): qty 2

## 2015-05-30 MED ORDER — MEMANTINE HCL ER 28 MG PO CP24
28.0000 mg | ORAL_CAPSULE | Freq: Every day | ORAL | Status: DC
  Filled 2015-05-30: qty 1

## 2015-05-30 MED ORDER — CLONAZEPAM 0.5 MG PO TABS: 0.5000 mg | ORAL_TABLET | Freq: Every day | ORAL | Status: DC

## 2015-05-30 NOTE — Progress Notes (Signed)
Daily Progress Note   Patient Name: Nicholas Olsen       Date: 05/30/2015 DOB: 1934-06-14  Age: 81 y.o. MRN#: [id number] Attending Physician: Theodis Blaze, MD Primary Care Physician: Zigmund Gottron, MD Admit Date: 05/26/2015  Reason for Consultation/Follow-up: Establishing goals of care and Psychosocial/spiritual support  Subjective:     -continued conversation regarding [place] and treatment plan with wife at bedside   -continued conversation regarding long term poor prognosis  regardless of offered medical interventions. I encouraged family to consider the patient as a whole, and consider  what are his personnel values as it relates to his quality of life, always consider what is the intention of all medical interventions      Length of Stay: 4 days  Current Medications: Scheduled Meds:  . clonazePAM  0.5 mg Oral QHS  . clotrimazole   Topical BID  . memantine  28 mg Oral QHS   And  . donepezil  10 mg Oral QHS  . feeding supplement  1 Container Oral TID BM  . insulin aspart  0-9 Units Subcutaneous Q4H  . levothyroxine  50 mcg Oral QAC breakfast  . nystatin   Topical BID  . pantoprazole (PROTONIX) IV  40 mg Intravenous Q12H  . PARoxetine  10 mg Oral Daily  . potassium chloride  40 mEq Oral Once  . propranolol ER  80 mg Oral Daily  . sodium chloride  3 mL Intravenous Q12H    Continuous Infusions:    PRN Meds: acetaminophen **OR** acetaminophen, diphenhydrAMINE, ondansetron **OR** ondansetron (ZOFRAN) IV  Palliative Performance Scale: 30 % at best     Vital Signs: BP 116/77 mmHg  Pulse 69  Temp(Src) 97.8 F (36.6 C) (Oral)  Resp 18  Ht 6\' 2"  (1.88 m)  Wt 65.091 kg (143 lb 8 oz)  BMI 18.42 kg/m2  SpO2 100% SpO2: SpO2: 100 % O2 Device: O2 Device: Not Delivered O2 Flow Rate: O2 Flow Rate (L/min): 2 L/min  Intake/output summary:   Intake/Output Summary (Last 24 hours) at 05/30/15 1120 Last data filed at 05/30/15 1016  Gross per 24 hour  Intake   1100  ml  Output   2400 ml  Net  -1300 ml   LBM: Last BM Date: 05/29/15 Baseline Weight: Weight: 77.8 kg (171 lb 8.3 oz) Most recent weight: Weight: 65.091 kg (143 lb 8 oz)  Physical Exam:          General: ill appearing, confused  HEENT: moist buccal membranes CVS: RRR Resp: decreased in bases Neuro:unable to follow commands   Additional Data Reviewed: Recent Labs     05/28/15  0958  05/30/15  0515  WBC  10.1  7.7  HGB  8.0*  8.4*  PLT  219  265  NA  139  139  BUN  10  <5*  CREATININE  0.83  0.78     Problem List:  Patient Active Problem List   Diagnosis Date Noted  . Malnutrition of moderate degree 05/28/2015  . Weakness generalized   . Palliative care encounter 05/27/2015  . DNR (do not resuscitate) discussion 05/27/2015  . Gastrointestinal hemorrhage with melena   . Pre-syncope   . Dementia   . Melena 05/26/2015  . Pressure ulcer 05/26/2015  . GI bleed 05/26/2015  . Acute upper GI bleed 05/26/2015  . Tinea cruris 04/10/2015  . Loss of weight 04/10/2015  . Hypomagnesemia 11/12/2014  . Rotator cuff syndrome of right shoulder 02/09/2014  . Muscle spasm of back  01/23/2014  . Cramps of lower extremity 10/25/2013  . Skin benign neoplasm 05/19/2013  . Hypothyroidism 04/07/2013  . Diabetes 02/08/2013  . Benign essential tremor 02/08/2013  . Vascular dementia, uncomplicated 37/29/0211  . Reflux esophagitis 08/20/2011  . Diffuse large B cell lymphoma   . OSTEOPENIA 01/04/2009  . ALLERGIC RHINITIS 02/06/2008  . PROSTATE CANCER 12/09/2006  . Type II diabetes mellitus with peripheral circulatory disorder 12/09/2006  . HYPERCHOLESTEROLEMIA 12/09/2006  . HYPERTENSION, BENIGN SYSTEMIC 12/09/2006  . TACHYCARDIA, PAROXYSMAL SUPRAVENTRICULAR 12/09/2006  . CLAUDICATION, INTERMITTENT 12/09/2006  . NEPHROLITHIASIS 12/09/2006  . CERVICAL SPINE DISORDER, NOS 12/09/2006     Palliative Care Assessment & Plan    Code Status:  DNR  Goals of Care:  At this time family  is hopeful to prolong quality of life and are open to all offered and available medical interventions to prolong life.   Symptoms:  Agitation/specific to bedtime:  Depakote sprinkles 250 mg every night at bedtime    Prognosis: < 6 months--I worry that his prognosis is likely weeks to a month or two even with life prolonging interventions, ie radiation and transfusions  5. Discharge Planning:  Pending outcomes,  This is a great worry to family unsure of "what the future holds" and how the current treatment plan affects discharge options   Thank you for allowing the Palliative Medicine Team to assist in the care of this patient.   Time In: 0900 Time Out: 0935 Total Time 35 min Prolonged Time Billed  no     Greater than 50%  of this time was spent counseling and coordinating care related to the above assessment and plan.     Knox Royalty, NP  05/30/2015, 11:20 AM  Please contact Palliative Medicine Team phone at [phone] for questions and concerns.

## 2015-05-30 NOTE — Progress Notes (Signed)
Patient ID: Nicholas Olsen, male   DOB: July 18, 1934, 81 y.o.   MRN: [id number]  [facility] PROGRESS NOTE  Nicholas Olsen WYO:[id number] DOB: 03/03/1934 DOA: 05/26/2015 PCP: Zigmund Gottron, MD   Brief narrative:    81 y.o. male presented with melena and near syncope event, consistent with presumed upper GI bleed. He additionally had new right facial droop on presentation concerning for stroke. PMH is significant for Non- Hogkins B cell lymphoma, remote h/o prostate cancer, HTN, DM2, HLD. Pt transferred from [facility] to [facility] for consideration of palliative radiation therapy.   8/14: Admitted for melena and near syncopal event concerning for upper GI bleep 8/15: EGD --> multiple ulcerative gastric lesions concerning for worsening lymphoma tumor burden. Meeting with Primary oncologist and radiation oncologist for consideration of palliative radiation 8/16: Family meeting with Palliative care 8/17: Radiation simulation started by Dr. Valere Dross   Assessment/Plan:    Presumed Upper GI Bleed - ? Secondary to lymphoma recurrence  - Hg overall stable in the past 24 hours - EGD--> multiple gastric lesions concerning for progressing Lymphoma - S/p 2 units of blood follow post transfusion Hct--> post transfusion Hct stable - Protonix IV BID has been provided  - Hold home ASA  B cell lymphoma- EGD concerning for worsening tumor burden - Onc primary following, Rad Onc started with simulation 8/17, continue treatments  - appreciate assistance   Concern for stroke - CT head negative for stroke. Pt's wife feels right facial droop improving - Pt's family does not wish to pursue further stroke work up and state they are most interested in pursuing comfort care - Hold home ASA - PT/OT when stable  Mild hypokalemia - still low, continue to supplement  - will repeat BMP in AM  Hypotension - Improved with good perfusion after fluid repletion and 2 u pRBC's - continue to hold anti  hypertensives  Hypothyroidism - TSH 3.13 WNL - continue home synthroid  DM2- stable - SSI - CBG q6  Moderate PCM - encouraged PO intake  - changed diet to regular   Dementia - Progressing, at risk for [place] - Continue home Namenda, donepezil  - Reduce stimuli, sitter if shows agitation, low dose haldol can be given if needed   Palliative: - Pt's wife, Nicholas Olsen, expressed the family's wishes to pursue comfort care - agree with DNR status   DVT prophylaxis - SCD's  Code Status: DNR Family Communication:  plan of care discussed with the patient, wife at bedside  Disposition Plan: To be determined   IV access:  Peripheral IV  Procedures and diagnostic studies:    Ct Head Wo Contrast 05/26/2015  Stable atrophy and diffuse white matter disease. 2. No acute intracranial abnormality.    Ct Abdomen Pelvis W Contrast 05/17/2015   Suggestion of abnormal thickening of the gastric wall. Malignant involvement is not excluded.  Interval enlargement of soft tissue mass adjacent to lesser curvature of the stomach within the gastrohepatic ligament. Additional upper abdominal masses are grossly stable.  Grossly stable right lower lobe mass.    Portable Chest 1 View 05/26/2015  Stable bilateral lung masses.  No acute abnormality is noted.    Medical Consultants:  PCT Oncology Radiation oncology   Other Consultants:  PT  IAnti-Infectives:   None   Faye Ramsay, MD  [facility] Pager [phone]  If 7PM-7AM, please contact night-coverage [email or web address] Password [email or web address] 05/30/2015, 10:00 AM   LOS: 4 days   HPI/Subjective: No events overnight.   Objective: Filed Vitals:  05/29/15 0607 05/29/15 1500 05/29/15 2049 05/30/15 0516  BP: 126/70 139/93 154/80 116/77  Pulse: 78 96 73 69  Temp: 98.4 F (36.9 C) 98.1 F (36.7 C) 98.3 F (36.8 C) 97.8 F (36.6 C)  TempSrc: Oral Oral Oral Oral  Resp: 16 18 18 18   Height:      Weight:      SpO2: 100% 96% 95% 100%    Intake/Output  Summary (Last 24 hours) at 05/30/15 1000 Last data filed at 05/30/15 0130  Gross per 24 hour  Intake   1450 ml  Output   2150 ml  Net   -700 ml    Exam:   General:  Pt is sleeping, easy to awake, not in acute distress  Cardiovascular: Regular rate and rhythm,  no rubs, no gallops  Respiratory: Clear to auscultation bilaterally, diminished breath sounds at bases   Abdomen: Soft, non tender, non distended, bowel sounds present, no guarding  Data Reviewed: Basic Metabolic Panel:  Recent Labs Lab 05/26/15 1240 05/26/15 1303 05/26/15 1325 05/27/15 0628 05/28/15 0958 05/30/15 0515  NA 140 139 139 141 139 139  K 4.3 4.2 4.3 3.4* 3.3* 3.1*  CL 103 101 102 108 108 105  CO2 29  --   --  27 22 25   GLUCOSE 182* 178* 178* 107* 98 91  BUN 38* 35* 39* 20 10 <5*  CREATININE 0.97 1.00 0.90 0.92 0.83 0.78  CALCIUM 9.3  --   --  8.4* 8.3* 8.5*   Liver Function Tests:  Recent Labs Lab 05/26/15 1240  AST 19  ALT 11*  ALKPHOS 49  BILITOT 0.2*  PROT 6.0*  ALBUMIN 2.7*   CBC:  Recent Labs Lab 05/26/15 1240  05/26/15 1737 05/27/15 0953 05/27/15 1618 05/27/15 2209 05/28/15 0958 05/30/15 0515  WBC 16.4*  --  14.5* 12.1* 11.6* 10.2 10.1 7.7  NEUTROABS 14.0*  --  11.7*  --   --   --   --   --   HGB 6.3*  < > 8.2* 8.4* 9.0* 8.3* 8.0* 8.4*  HCT 19.7*  < > 24.7* 25.1* 27.7* 25.1* 24.3* 25.9*  MCV 92.5  --  90.5 89.0 89.6 90.0 91.0 90.6  PLT 309  --  276 240 236 225 219 265  < > = values in this interval not displayed. Cardiac Enzymes:  Recent Labs Lab 05/26/15 2221 05/27/15 0519 05/27/15 1038 05/27/15 2209  TROPONINI <0.03 <0.03 <0.03 <0.03   CBG:  Recent Labs Lab 05/29/15 1708 05/29/15 2046 05/30/15 0016 05/30/15 0400 05/30/15 0734  GLUCAP 88 96 76 96 78    Recent Results (from the past 240 hour(s))  MRSA PCR Screening     Status: None   Collection Time: 05/26/15  9:20 PM  Result Value Ref Range Status   MRSA by PCR NEGATIVE NEGATIVE Final    Comment:         The GeneXpert MRSA Assay (FDA approved for NASAL specimens only), is one component of a comprehensive MRSA colonization surveillance program. It is not intended to diagnose MRSA infection nor to guide or monitor treatment for MRSA infections.      Scheduled Meds: . clonazePAM  0.5 mg Oral QHS  . clotrimazole   Topical BID  . feeding supplement  1 Container Oral TID BM  . insulin aspart  0-9 Units Subcutaneous Q4H  . levothyroxine  50 mcg Oral QAC breakfast  . memantine  10 mg Oral BID  . nystatin   Topical BID  .  pantoprazole (PROTONIX) IV  40 mg Intravenous Q12H  . PARoxetine  10 mg Oral Daily  . propranolol ER  80 mg Oral Daily  . sodium chloride  3 mL Intravenous Q12H   Continuous Infusions:

## 2015-05-30 NOTE — Progress Notes (Addendum)
Inpatient 3-D simulation note: Nicholas Olsen completed 3-D situation today for treatment to his stomach.  He is set up to 6 field technique with anterior, posterior, left lateral, right lateral, and oblique fields.  6 unique multileaf collimators are designed to conform the field.  Dose volume histograms were obtained for the spinal cord, liver, and kidneys.  We met our departmental guidelines.  I'm prescribing 2000 cGy in 10 sessions.  He may receive a further 2000 cGy in 10 sessions to a reduced field after 2-4 week rest depending on his progress.

## 2015-05-30 NOTE — Progress Notes (Signed)
Radiation oncology chart note:  Nicholas Olsen was not able to cooperate by holding still for his treatment today.  I spoke with his wife this evening, and she will come down with him tomorrow and see if we can proceed with treatment as planned.

## 2015-05-31 ENCOUNTER — Telehealth: Payer: Self-pay | Admitting: *Deleted

## 2015-05-31 ENCOUNTER — Ambulatory Visit
Admit: 2015-05-31 | Discharge: 2015-05-31 | Disposition: A | Discharge status: Home / Self Care | Payer: Medicare PPO | Attending: Radiation Oncology | Admitting: Radiation Oncology

## 2015-05-31 ENCOUNTER — Ambulatory Visit: Payer: Medicare PPO | Admitting: Oncology

## 2015-05-31 DIAGNOSIS — C8590 Non-Hodgkin lymphoma, unspecified, unspecified site: Secondary | ICD-10-CM | POA: Insufficient documentation

## 2015-05-31 DIAGNOSIS — R451 Restlessness and agitation: Secondary | ICD-10-CM

## 2015-05-31 LAB — CBC
HEMATOCRIT: 26.3 % — AB (ref 39.0–52.0)
Hemoglobin: 8.7 g/dL — ABNORMAL LOW (ref 13.0–17.0)
MCH: 30.2 pg (ref 26.0–34.0)
MCHC: 33.1 g/dL (ref 30.0–36.0)
MCV: 91.3 fL (ref 78.0–100.0)
Platelets: 269 10*3/uL (ref 150–400)
RBC: 2.88 MIL/uL — ABNORMAL LOW (ref 4.22–5.81)
RDW: 17.5 % — AB (ref 11.5–15.5)
WBC: 8.2 10*3/uL (ref 4.0–10.5)

## 2015-05-31 LAB — GLUCOSE, CAPILLARY
GLUCOSE-CAPILLARY: 93 mg/dL (ref 65–99)
GLUCOSE-CAPILLARY: 185 mg/dL — AB (ref 65–99)
Glucose-Capillary: 117 mg/dL — ABNORMAL HIGH (ref 65–99)
Glucose-Capillary: 123 mg/dL — ABNORMAL HIGH (ref 65–99)

## 2015-05-31 LAB — BASIC METABOLIC PANEL
ANION GAP: 7 (ref 5–15)
BUN: 10 mg/dL (ref 6–20)
CALCIUM: 8.5 mg/dL — AB (ref 8.9–10.3)
CO2: 25 mmol/L (ref 22–32)
Chloride: 104 mmol/L (ref 101–111)
Creatinine, Ser: 0.91 mg/dL (ref 0.61–1.24)
Glucose, Bld: 115 mg/dL — ABNORMAL HIGH (ref 65–99)
Potassium: 3.6 mmol/L (ref 3.5–5.1)
Sodium: 136 mmol/L (ref 135–145)

## 2015-05-31 MED ORDER — PANTOPRAZOLE SODIUM 40 MG PO TBEC
40.0000 mg | DELAYED_RELEASE_TABLET | Freq: Two times a day (BID) | ORAL | Status: DC
  Administered 2015-05-31 – 2015-06-03 (×7): 40 mg via ORAL
  Filled 2015-05-31 (×10): qty 1

## 2015-05-31 MED ORDER — DIVALPROEX SODIUM 125 MG PO CSDR
500.0000 mg | DELAYED_RELEASE_CAPSULE | Freq: Every day | ORAL | Status: DC
  Administered 2015-05-31 – 2015-06-03 (×4): 500 mg via ORAL
  Filled 2015-05-31 (×5): qty 4

## 2015-05-31 NOTE — Evaluation (Addendum)
Occupational Therapy Evaluation Patient Details Name: STEPHFON BOVEY MRN: [id number] DOB: 1934-01-15 Today's Date: 05/31/2015    History of Present Illness 81 yo M with known PMH of large B cell lymphoma, DM2, dementia, hyperthyroidism, HTN who presents with pre-syncope and melena.    Clinical Impression   This 81 year old man was admitted for the above.  He has undergone simulation for XRT.  Will follow in acute with supervision to min A level goals.  Cognition is impairing ADLs and pt is responding well to tactile cues in context coupled with verbal cues.  Pt was mostly independent prior to hospitalization    Follow Up Recommendations  Supervision/Assistance - 24 hour    Equipment Recommendations   (has high commode:  tba further)    Recommendations for Other Services       Precautions / Restrictions Precautions Precautions: Fall Restrictions Weight Bearing Restrictions: No      Mobility Bed Mobility Overal bed mobility: Needs Assistance (Simultaneous filing. User may not have seen previous data.) Bed Mobility: Supine to Sit (Simultaneous filing. User may not have seen previous data.)     Supine to sit: Min guard (Simultaneous filing. User may not have seen previous data.)     General bed mobility comments: verbal and manual cues for technique, HOB up 35* (Simultaneous filing. User may not have seen previous data.)  Transfers Overall transfer level: Needs assistance Equipment used: Rolling walker (2 wheeled) (Simultaneous filing. User may not have seen previous data.) Transfers: Sit to/from Stand (Simultaneous filing. User may not have seen previous data.) Sit to Stand: Min assist;From elevated surface (Simultaneous filing. User may not have seen previous data.)         General transfer comment: manual/verbal cues for handplacement, min A to rise (Simultaneous filing. User may not have seen previous data.)    Balance     Sitting balance-Leahy Scale: Good        Standing balance-Leahy Scale: Fair                              ADL Overall ADL's : Needs assistance/impaired     Grooming: Minimal assistance;Wash/dry face;Sitting   Upper Body Bathing: Moderate assistance;Sitting   Lower Body Bathing: Maximal assistance;Sit to/from stand   Upper Body Dressing : Maximal assistance;Sitting       Toilet Transfer: Minimal assistance;Ambulation (recliner)             General ADL Comments: multimodal cues needed for all tasks.  Slow to process.  Student nurse present:  Pt participated in bathing and donning gown as robe     Vision     Perception     Praxis      Pertinent Vitals/Pain Pain Assessment: Faces Faces Pain Scale: Hurts even more Pain Location: R posterolateral neck Pain Descriptors / Indicators: Aching Pain Intervention(s): Heat applied;Monitored during session;Limited activity within patient's tolerance     Hand Dominance     Extremity/Trunk Assessment Upper Extremity Assessment Upper Extremity Assessment: Generalized weakness           Communication Communication Communication:  (multimodal cues, possibly receptive difficulties)   Cognition Arousal/Alertness: Awake/alert Behavior During Therapy: WFL for tasks assessed/performed Overall Cognitive Status: Impaired/Different from baseline (h/o dementia, exacerbated in hospital  Area of Impairment: Orientation;Memory Orientation Level: Disoriented to;Place;Time;Situation     Following Commands: Follows one step commands with increased time (and multimodal cues)     Problem Solving: Slow processing;Requires  tactile cues;Requires verbal cues     General Comments       Exercises       Shoulder Instructions      Home Living Family/patient expects to be discharged to:: Private residence Living Arrangements: Spouse/significant other                     Bathroom Toilet: Handicapped height     Home Equipment: Environmental consultant - 2  wheels          Prior Functioning/Environment Level of Independence: Independent with assistive device(s)        Comments: got walker about 3 weeks prior to admission    OT Diagnosis: Cognitive deficits;Generalized weakness   OT Problem List: Decreased strength;Decreased activity tolerance;Decreased cognition;Decreased safety awareness;Pain   OT Treatment/Interventions: Self-care/ADL training;DME and/or AE instruction;Patient/family education;Therapeutic activities;Cognitive remediation/compensation    OT Goals(Current goals can be found in the care plan section) Acute Rehab OT Goals Patient Stated Goal: return home OT Goal Formulation: With family Time For Goal Achievement: 06/14/15 Potential to Achieve Goals: Fair ADL Goals Pt Will Perform Grooming: with supervision;sitting Pt Will Perform Upper Body Bathing: with min assist;sitting Pt Will Perform Upper Body Dressing: with min assist;sitting Pt Will Transfer to Toilet: with min guard assist;ambulating;bedside commode Additional ADL Goal #1: pt will follow one step commands with verbal/tactile cues in ADL context,initiating within 10 seconds  OT Frequency: Min 2X/week   Barriers to D/C:            Co-evaluation PT/OT/SLP Co-Evaluation/Treatment: Yes Reason for Co-Treatment: Necessary to address cognition/behavior during functional activity (Simultaneous filing. User may not have seen previous data.) PT goals addressed during session: Mobility/safety with mobility;Balance OT goals addressed during session: ADL's and self-care      End of Session Nurse Communication: Mobility status  Activity Tolerance: Patient tolerated treatment well Patient left: in chair;with call bell/phone within reach;with nursing/sitter in room;with family/visitor present   Time: 0833-0900 OT Time Calculation (min): 27 min Charges:  OT General Charges $OT Visit: 1 Procedure OT Evaluation $Initial OT Evaluation Tier I: 1  Procedure G-Codes:    Jevonte Clanton 2015-06-22, 9:54 AM Lesle Chris, OTR/L [phone] 06/22/15

## 2015-05-31 NOTE — Progress Notes (Signed)
Physical Therapy Treatment Patient Details Name: Nicholas Olsen MRN: [id number] DOB: May 18, 1934 Today's Date: 05/31/2015    History of Present Illness 81 yo M with known PMH of large B cell lymphoma, DM2, dementia, hyperthyroidism, HTN who presents with pre-syncope and melena.     PT Comments    Pt ambulated 300' with trial of RW and then with hand held assist of 1, pt steady with hand held assist. Pt remains disoriented to place and situation and requires verbal and manual cues to follow instructions. Pt's wife plans to care for pt at home. Will continue working towards goal of walking without assistance, which is his baseline.   Follow Up Recommendations  Home health PT     Equipment Recommendations  None recommended by PT    Recommendations for Other Services       Precautions / Restrictions Precautions Precautions: Fall Restrictions Weight Bearing Restrictions: No    Mobility  Bed Mobility Overal bed mobility: Needs Assistance (Simultaneous filing. User may not have seen previous data.) Bed Mobility: Supine to Sit (Simultaneous filing. User may not have seen previous data.)     Supine to sit: Min guard (Simultaneous filing. User may not have seen previous data.)     General bed mobility comments: verbal and manual cues for technique, HOB up 35* (Simultaneous filing. User may not have seen previous data.)  Transfers Overall transfer level: Needs assistance Equipment used: Rolling walker (2 wheeled) (Simultaneous filing. User may not have seen previous data.) Transfers: Sit to/from Stand (Simultaneous filing. User may not have seen previous data.) Sit to Stand: Min assist;From elevated surface (Simultaneous filing. User may not have seen previous data.)         General transfer comment: manual/verbal cues for handplacement, min A to rise (Simultaneous filing. User may not have seen previous data.)  Ambulation/Gait Ambulation/Gait assistance: Min guard;Min  assist Ambulation Distance (Feet): 300 Feet Assistive device: 1 person hand held assist;Rolling walker (2 wheeled) Gait Pattern/deviations: Step-through pattern;Decreased stride length   Gait velocity interpretation: at or above normal speed for age/gender General Gait Details: initially walked 100' with RW, then with HHA of 1 for balance, no LOB (baseline is walking without AD)   Stairs            Wheelchair Mobility    Modified Rankin (Stroke Patients Only)       Balance     Sitting balance-Leahy Scale: Good       Standing balance-Leahy Scale: Fair                      Cognition Arousal/Alertness: Awake/alert Behavior During Therapy: WFL for tasks assessed/performed Overall Cognitive Status: Impaired/Different from baseline (h/o dementia, exacerbated in hosptial, pt didn't sleep last night per grandson) Area of Impairment: Orientation;Memory Orientation Level: Disoriented to;Place;Time;Situation     Following Commands: Follows one step commands with increased time (and multimodal cues)     Problem Solving: Slow processing;Requires tactile cues;Requires verbal cues      Exercises      General Comments        Pertinent Vitals/Pain Pain Assessment: Faces Faces Pain Scale: Hurts even more Pain Location: R posterolateral neck Pain Descriptors / Indicators: Aching Pain Intervention(s): Heat applied;Monitored during session;Limited activity within patient's tolerance    Home Living Family/patient expects to be discharged to:: Private residence Living Arrangements: Spouse/significant other                  Prior Function Level of  Independence: Independent with assistive device(s)      Comments: got walker about 3 weeks prior to admission   PT Goals (current goals can now be found in the care plan section) Acute Rehab PT Goals Patient Stated Goal: return home PT Goal Formulation: With family Time For Goal Achievement:  06/12/15 Potential to Achieve Goals: Good Progress towards PT goals: Progressing toward goals    Frequency  Min 3X/week    PT Plan Current plan remains appropriate    Co-evaluation PT/OT/SLP Co-Evaluation/Treatment: Yes Reason for Co-Treatment: Necessary to address cognition/behavior during functional activity (Simultaneous filing. User may not have seen previous data.) PT goals addressed during session: Mobility/safety with mobility;Balance OT goals addressed during session: ADL's and self-care     End of Session Equipment Utilized During Treatment: Gait belt Activity Tolerance: Patient tolerated treatment well Patient left: in chair;with call bell/phone within reach;with chair alarm set;with family/visitor present     Time: 4287-6811 PT Time Calculation (min) (ACUTE ONLY): 29 min  Charges:  $Gait Training: 8-22 mins                    G Codes:      Philomena Doheny 05/31/2015, 9:14 AM [phone]

## 2015-05-31 NOTE — Telephone Encounter (Signed)
Called and spoke with Rn for Nicholas Olsen today.  He was able  To ambulate with PT assist today and appears calmer than yesterday when he refused treatment.  Upon our request, his spouse will accompany him today for his 11:30am treatment.  He will have pre-meds to assist with pain management.  Herther Welker, RTT informed.

## 2015-05-31 NOTE — Progress Notes (Signed)
Patient ID: Nicholas Olsen, male   DOB: 1934/07/20, 81 y.o.   MRN: [id number]  [facility] PROGRESS NOTE  DEAGLAN LILE NWG:[id number] DOB: 1934/07/09 DOA: 05/26/2015 PCP: Zigmund Gottron, MD   Brief narrative:    81 y.o. male presented with melena and near syncope event, consistent with presumed upper GI bleed. He additionally had new right facial droop on presentation concerning for stroke. PMH is significant for Non- Hogkins B cell lymphoma, remote h/o prostate cancer, HTN, DM2, HLD. Pt transferred from [facility] to [facility] for consideration of palliative radiation therapy.   8/14: Admitted for melena and near syncopal event concerning for upper GI bleep 8/15: EGD --> multiple ulcerative gastric lesions concerning for worsening lymphoma tumor burden. Meeting with Primary oncologist and radiation oncologist for consideration of palliative radiation 8/16: Family meeting with Palliative care 8/17: Radiation simulation started by Dr. Valere Dross   Assessment/Plan:    Presumed Upper GI Bleed - ? Secondary to lymphoma recurrence  - Hg overall stable in the past 48 hours - EGD--> multiple gastric lesions concerning for progressing Lymphoma - S/p 2 units of blood follow post transfusion Hct--> post transfusion Hct stable - change Protonix to PO  - continue to Hold home ASA  B cell lymphoma- EGD concerning for worsening tumor burden - Onc primary following, Rad Onc started with simulation 8/17, continue treatments  - pt unable to complete treatment 8/18, was agitated and not able to hold still  - appreciate assistance   Concern for stroke - CT head negative for stroke. Pt's wife feels right facial droop improving - Pt's family does not wish to pursue further stroke work up and state they are most interested in pursuing comfort care - Hold home ASA - PT evaluation done, HH PT recommended   Mild hypokalemia - supplemented and WNL this AM   Hypotension - Improved with good perfusion  after fluid repletion and 2 u pRBC's - continue to hold anti hypertensives  Hypothyroidism - TSH 3.13 WNL - continue home synthroid  DM2- stable - SSI - CBG q6  Moderate PCM - encouraged PO intake  - changed diet to regular   Dementia - Progressing, at risk for sun downing - Continue home Namenda, donepezil  - Reduce stimuli, sitter if shows agitation, low dose haldol can be given if needed   Palliative: - Pt's wife, [name], expressed the family's wishes to pursue comfort care - plan to take home when ready   DVT prophylaxis - SCD's  Code Status: DNR Family Communication:  plan of care discussed with the patient, wife at bedside  Disposition Plan: To be determined but most likely home   IV access:  Peripheral IV  Procedures and diagnostic studies:    Ct Head Wo Contrast 05/26/2015  Stable atrophy and diffuse white matter disease. 2. No acute intracranial abnormality.    Ct Abdomen Pelvis W Contrast 05/17/2015   Suggestion of abnormal thickening of the gastric wall. Malignant involvement is not excluded.  Interval enlargement of soft tissue mass adjacent to lesser curvature of the stomach within the gastrohepatic ligament. Additional upper abdominal masses are grossly stable.  Grossly stable right lower lobe mass.    Portable Chest 1 View 05/26/2015  Stable bilateral lung masses.  No acute abnormality is noted.    Medical Consultants:  PCT Oncology Radiation oncology   Other Consultants:  PT  IAnti-Infectives:   None   Faye Ramsay, MD  [facility] Pager [phone]  If 7PM-7AM, please contact night-coverage [email or web address] Password [email or web address]  05/31/2015, 11:20 AM   LOS: 5 days   HPI/Subjective: No events overnight.   Objective: Filed Vitals:   05/30/15 1604 05/30/15 2121 05/31/15 0551 05/31/15 1100  BP: 125/77 122/71 121/63 130/62  Pulse: 111 71 62 62  Temp:  98.6 F (37 C) 97.9 F (36.6 C) 97.7 F (36.5 C)  TempSrc:  Oral Oral Oral  Resp:  18 18 18    Height:      Weight:      SpO2: 91% 99% 99% 97%    Intake/Output Summary (Last 24 hours) at 05/31/15 1120 Last data filed at 05/31/15 1100  Gross per 24 hour  Intake    510 ml  Output      0 ml  Net    510 ml    Exam:   General:  Pt is sleeping, easy to awake, not in acute distress  Cardiovascular: Regular rate and rhythm,  no rubs, no gallops  Respiratory: Clear to auscultation bilaterally, diminished breath sounds at bases   Abdomen: Soft, non tender, non distended, bowel sounds present, no guarding  Data Reviewed: Basic Metabolic Panel:  Recent Labs Lab 05/26/15 1240  05/26/15 1325 05/27/15 0628 05/28/15 0958 05/30/15 0515 05/31/15 0547  NA 140  < > 139 141 139 139 136  K 4.3  < > 4.3 3.4* 3.3* 3.1* 3.6  CL 103  < > 102 108 108 105 104  CO2 29  --   --  27 22 25 25   GLUCOSE 182*  < > 178* 107* 98 91 115*  BUN 38*  < > 39* 20 10 <5* 10  CREATININE 0.97  < > 0.90 0.92 0.83 0.78 0.91  CALCIUM 9.3  --   --  8.4* 8.3* 8.5* 8.5*  < > = values in this interval not displayed. Liver Function Tests:  Recent Labs Lab 05/26/15 1240  AST 19  ALT 11*  ALKPHOS 49  BILITOT 0.2*  PROT 6.0*  ALBUMIN 2.7*   CBC:  Recent Labs Lab 05/26/15 1240  05/26/15 1737  05/27/15 1618 05/27/15 2209 05/28/15 0958 05/30/15 0515 05/31/15 0547  WBC 16.4*  --  14.5*  < > 11.6* 10.2 10.1 7.7 8.2  NEUTROABS 14.0*  --  11.7*  --   --   --   --   --   --   HGB 6.3*  < > 8.2*  < > 9.0* 8.3* 8.0* 8.4* 8.7*  HCT 19.7*  < > 24.7*  < > 27.7* 25.1* 24.3* 25.9* 26.3*  MCV 92.5  --  90.5  < > 89.6 90.0 91.0 90.6 91.3  PLT 309  --  276  < > 236 225 219 265 269  < > = values in this interval not displayed. Cardiac Enzymes:  Recent Labs Lab 05/26/15 2221 05/27/15 0519 05/27/15 1038 05/27/15 2209  TROPONINI <0.03 <0.03 <0.03 <0.03   CBG:  Recent Labs Lab 05/30/15 0734 05/30/15 1139 05/30/15 1609 05/30/15 2137 05/31/15 0804  GLUCAP 78 81 115* 152* 93    Recent Results  (from the past 240 hour(s))  MRSA PCR Screening     Status: None   Collection Time: 05/26/15  9:20 PM  Result Value Ref Range Status   MRSA by PCR NEGATIVE NEGATIVE Final    Comment:        The GeneXpert MRSA Assay (FDA approved for NASAL specimens only), is one component of a comprehensive MRSA colonization surveillance program. It is not intended to diagnose MRSA infection nor to guide or  monitor treatment for MRSA infections.      Scheduled Meds: . clonazePAM  0.25 mg Oral QHS  . clotrimazole   Topical BID  . divalproex  250 mg Oral QHS  . memantine  28 mg Oral QHS   And  . donepezil  10 mg Oral QHS  . feeding supplement  1 Container Oral TID BM  . insulin aspart  0-9 Units Subcutaneous TID AC & HS  . levothyroxine  50 mcg Oral QAC breakfast  . nystatin   Topical BID  . pantoprazole (PROTONIX) IV  40 mg Intravenous Q12H  . PARoxetine  10 mg Oral Daily  . propranolol ER  80 mg Oral Daily  . sodium chloride  3 mL Intravenous Q12H   Continuous Infusions:

## 2015-05-31 NOTE — Progress Notes (Signed)
Daily Progress Note   Patient Name: Nicholas Olsen       Date: 05/31/2015 DOB: 02-03-1934  Age: 81 y.o. MRN#: [id number] Attending Physician: Theodis Blaze, MD Primary Care Physician: Zigmund Gottron, MD Admit Date: 05/26/2015  Reason for Consultation/Follow-up: Establishing goals of care and Psychosocial/spiritual support and symptom management as indicated  Subjective:     -continued conversation regarding Poneto and treatment plan with wife at bedside, she expresses "worry" that he did not complete radiation yesterday, he was uncooperative and refused.  "I'm going to go with him tomorrow"   -continued conversation regarding long term poor prognosis  regardless of offered medical interventions. I encouraged wife to consider the patient as a whole, and consider  what are his personnel values as it relates to his quality of life, always consider what is the intention of all medical interventions.   "I'm beginning to understand what you are saying"      Length of Stay: 5 days  Current Medications: Scheduled Meds:  . clonazePAM  0.25 mg Oral QHS  . clotrimazole   Topical BID  . divalproex  250 mg Oral QHS  . memantine  28 mg Oral QHS   And  . donepezil  10 mg Oral QHS  . feeding supplement  1 Container Oral TID BM  . insulin aspart  0-9 Units Subcutaneous TID AC & HS  . levothyroxine  50 mcg Oral QAC breakfast  . nystatin   Topical BID  . pantoprazole  40 mg Oral BID  . PARoxetine  10 mg Oral Daily  . propranolol ER  80 mg Oral Daily  . sodium chloride  3 mL Intravenous Q12H    Continuous Infusions:    PRN Meds: acetaminophen **OR** acetaminophen, diphenhydrAMINE, ondansetron **OR** ondansetron (ZOFRAN) IV  Palliative Performance Scale: 30 % at best     Vital Signs: BP 130/62 mmHg  Pulse 62  Temp(Src) 97.7 F (36.5 C) (Oral)  Resp 18  Ht 6\' 2"  (1.88 m)  Wt 65.091 kg (143 lb 8 oz)  BMI 18.42 kg/m2  SpO2 97% SpO2: SpO2: 97 % O2 Device: O2 Device: Not  Delivered O2 Flow Rate: O2 Flow Rate (L/min): 2 L/min  Intake/output summary:   Intake/Output Summary (Last 24 hours) at 05/31/15 1232 Last data filed at 05/31/15 1100  Gross per 24 hour  Intake    510 ml  Output    950 ml  Net   -440 ml   LBM: Last BM Date: 05/31/15 Baseline Weight: Weight: 77.8 kg (171 lb 8.3 oz) Most recent weight: Weight: 65.091 kg (143 lb 8 oz)  Physical Exam:          General: chronically  ill appearing, confused  HEENT: moist buccal membranes CVS: RRR Resp: decreased in bases Neuro: confused to place and time   Additional Data Reviewed: Recent Labs     05/30/15  0515  05/31/15  0547  WBC  7.7  8.2  HGB  8.4*  8.7*  PLT  265  269  NA  139  136  BUN  <5*  10  CREATININE  0.78  0.91     Problem List:  Patient Active Problem List   Diagnosis Date Noted  . Agitation 05/31/2015  . Malnutrition of moderate degree 05/28/2015  . Weakness generalized   . Palliative care encounter 05/27/2015  . DNR (do not resuscitate) discussion 05/27/2015  . Gastrointestinal hemorrhage with melena   . Pre-syncope   . Dementia   .  Melena 05/26/2015  . Pressure ulcer 05/26/2015  . GI bleed 05/26/2015  . Acute upper GI bleed 05/26/2015  . Tinea cruris 04/10/2015  . Loss of weight 04/10/2015  . Hypomagnesemia 11/12/2014  . Rotator cuff syndrome of right shoulder 02/09/2014  . Muscle spasm of back 01/23/2014  . Cramps of lower extremity 10/25/2013  . Skin benign neoplasm 05/19/2013  . Hypothyroidism 04/07/2013  . Diabetes 02/08/2013  . Benign essential tremor 02/08/2013  . Vascular dementia, uncomplicated 78/29/5621  . Reflux esophagitis 08/20/2011  . Diffuse large B cell lymphoma   . OSTEOPENIA 01/04/2009  . ALLERGIC RHINITIS 02/06/2008  . PROSTATE CANCER 12/09/2006  . Type II diabetes mellitus with peripheral circulatory disorder 12/09/2006  . HYPERCHOLESTEROLEMIA 12/09/2006  . HYPERTENSION, BENIGN SYSTEMIC 12/09/2006  . TACHYCARDIA, PAROXYSMAL  SUPRAVENTRICULAR 12/09/2006  . CLAUDICATION, INTERMITTENT 12/09/2006  . NEPHROLITHIASIS 12/09/2006  . CERVICAL SPINE DISORDER, NOS 12/09/2006     Palliative Care Assessment & Plan    Code Status:  DNR  Goals of Care:  At this time family is hopeful to prolong quality of life and are open to all offered and available medical interventions to prolong life.   Symptoms:  Agitation/specific to bedtime:  Wife reports she thinks Depakote  "it helped some last night"                                                       Increase  Depakote sprinkles to 500 mg every night at bedtime    Prognosis: < 6 months--I worry that his prognosis is likely weeks to a month or two even with life prolonging interventions, ie radiation and transfusions  5. Discharge Planning:  Pending outcomes   Thank you for allowing the Palliative Medicine Team to assist in the care of this patient.   Time In: 0835 Time Out: 0900 Total Time 25 min Prolonged Time Billed  no     Greater than 50%  of this time was spent counseling and coordinating care related to the above assessment and plan.     Knox Royalty, NP  05/31/2015, 12:32 PM  Please contact Palliative Medicine Team phone at [phone] for questions and concerns.

## 2015-06-01 LAB — BASIC METABOLIC PANEL
Anion gap: 5 (ref 5–15)
BUN: 12 mg/dL (ref 6–20)
CHLORIDE: 105 mmol/L (ref 101–111)
CO2: 27 mmol/L (ref 22–32)
CREATININE: 0.76 mg/dL (ref 0.61–1.24)
Calcium: 8.5 mg/dL — ABNORMAL LOW (ref 8.9–10.3)
GFR calc Af Amer: >60 mL/min (ref >60)
GFR calc non Af Amer: >60 mL/min (ref >60)
GLUCOSE: 105 mg/dL — AB (ref 65–99)
Potassium: 3.5 mmol/L (ref 3.5–5.1)
Sodium: 137 mmol/L (ref 135–145)

## 2015-06-01 LAB — GLUCOSE, CAPILLARY
GLUCOSE-CAPILLARY: 214 mg/dL — AB (ref 65–99)
GLUCOSE-CAPILLARY: 101 mg/dL — AB (ref 65–99)
Glucose-Capillary: 89 mg/dL (ref 65–99)
Glucose-Capillary: 97 mg/dL (ref 65–99)

## 2015-06-01 LAB — CBC
HEMATOCRIT: 26 % — AB (ref 39.0–52.0)
HEMOGLOBIN: 8.2 g/dL — AB (ref 13.0–17.0)
MCH: 28.9 pg (ref 26.0–34.0)
MCHC: 31.5 g/dL (ref 30.0–36.0)
MCV: 91.5 fL (ref 78.0–100.0)
Platelets: 257 10*3/uL (ref 150–400)
RBC: 2.84 MIL/uL — ABNORMAL LOW (ref 4.22–5.81)
RDW: 17.6 % — ABNORMAL HIGH (ref 11.5–15.5)
WBC: 7.8 10*3/uL (ref 4.0–10.5)

## 2015-06-01 NOTE — Progress Notes (Signed)
Patient ID: Nicholas Olsen, male   DOB: Nov 04, 1933, 81 y.o.   MRN: [id number]  [facility] PROGRESS NOTE  Nicholas Olsen GEX:[id number] DOB: 26-Apr-1934 DOA: 05/26/2015 PCP: Zigmund Gottron, MD   Brief narrative:    81 y.o. male presented with melena and near syncope event, consistent with presumed upper GI bleed. He additionally had new right facial droop on presentation concerning for stroke. PMH is significant for Non- Hogkins B cell lymphoma, remote h/o prostate cancer, HTN, DM2, HLD. Pt transferred from [facility] to [facility] for consideration of palliative radiation therapy.   8/14: Admitted for melena and near syncopal event concerning for upper GI bleep 8/15: EGD --> multiple ulcerative gastric lesions concerning for worsening lymphoma tumor burden. Meeting with Primary oncologist and radiation oncologist for consideration of palliative radiation 8/16: Family meeting with Palliative care 8/17: Radiation simulation started by Dr. Valere Dross   Assessment/Plan:    Presumed Upper GI Bleed - ? Secondary to lymphoma recurrence  - Hg overall stable in the past 48 hours - EGD--> multiple gastric lesions concerning for progressing Lymphoma - S/p 2 units of blood follow post transfusion Hct--> post transfusion Hct stable - continue Protonix PO  - continue to Hold home ASA  B cell lymphoma- EGD concerning for worsening tumor burden - Onc primary following, Rad Onc started with simulation 8/17, continue treatments  - pt unable to complete treatment 8/18, was agitated and not able to hold still  - appreciate assistance   Concern for stroke - CT head negative for stroke. Pt's wife feels right facial droop improving - Pt's family does not wish to pursue further stroke work up and state they are most interested in pursuing comfort care - Hold home ASA - PT evaluation done, HH PT recommended   Mild hypokalemia - supplemented and remains WNL this AM   Hypotension - Improved with good  perfusion after fluid repletion and 2 u pRBC's - continue to hold anti hypertensives as BP has been stable off of medications   Hypothyroidism - TSH 3.13 WNL - continue home synthroid  DM2- stable - SSI - CBG q6  Moderate PCM - encouraged PO intake  - changed diet to regular   Dementia - Progressing, at risk for sun downing - Continue home Namenda, donepezil  - Reduce stimuli, sitter if shows agitation, low dose haldol can be given if needed   Palliative: - Pt's wife, [name], expressed the family's wishes to pursue comfort care - plan to take home when ready   DVT prophylaxis - SCD's  Code Status: DNR Family Communication:  plan of care discussed with the patient, wife at bedside  Disposition Plan: To be determined but most likely home in by Monday   IV access:  Peripheral IV  Procedures and diagnostic studies:    Ct Head Wo Contrast 05/26/2015  Stable atrophy and diffuse white matter disease. 2. No acute intracranial abnormality.    Ct Abdomen Pelvis W Contrast 05/17/2015   Suggestion of abnormal thickening of the gastric wall. Malignant involvement is not excluded.  Interval enlargement of soft tissue mass adjacent to lesser curvature of the stomach within the gastrohepatic ligament. Additional upper abdominal masses are grossly stable.  Grossly stable right lower lobe mass.    Portable Chest 1 View 05/26/2015  Stable bilateral lung masses.  No acute abnormality is noted.    Medical Consultants:  PCT Oncology Radiation oncology   Other Consultants:  PT  IAnti-Infectives:   None   Faye Ramsay, MD  [place] Pager [phone]  If 7PM-7AM, please contact night-coverage [email or web address] Password [email or web address] 06/01/2015, 7:54 AM   LOS: 6 days   HPI/Subjective: No events overnight.   Objective: Filed Vitals:   05/31/15 1100 05/31/15 1400 05/31/15 2300 06/01/15 0520  BP: 130/62 109/61 126/71 112/61  Pulse: 62 54 57 58  Temp: 97.7 F (36.5 C)  99 F (37.2 C) 99 F  (37.2 C)  TempSrc: Oral Other (Comment) Oral Oral  Resp: 18 12 18 18   Height:      Weight:      SpO2: 97% 96% 98% 100%    Intake/Output Summary (Last 24 hours) at 06/01/15 0754 Last data filed at 06/01/15 0703  Gross per 24 hour  Intake    480 ml  Output   1950 ml  Net  -1470 ml    Exam:   General:  Pt is sleeping, easy to awake, not in acute distress  Cardiovascular: Regular rate and rhythm,  no rubs, no gallops  Respiratory: Clear to auscultation bilaterally, diminished breath sounds at bases   Abdomen: Soft, non tender, non distended, bowel sounds present, no guarding  Data Reviewed: Basic Metabolic Panel:  Recent Labs Lab 05/27/15 0628 05/28/15 0958 05/30/15 0515 05/31/15 0547 06/01/15 0514  NA 141 139 139 136 137  K 3.4* 3.3* 3.1* 3.6 3.5  CL 108 108 105 104 105  CO2 27 22 25 25 27   GLUCOSE 107* 98 91 115* 105*  BUN 20 10 <5* 10 12  CREATININE 0.92 0.83 0.78 0.91 0.76  CALCIUM 8.4* 8.3* 8.5* 8.5* 8.5*   Liver Function Tests:  Recent Labs Lab 05/26/15 1240  AST 19  ALT 11*  ALKPHOS 49  BILITOT 0.2*  PROT 6.0*  ALBUMIN 2.7*   CBC:  Recent Labs Lab 05/26/15 1240  05/26/15 1737  05/27/15 2209 05/28/15 0958 05/30/15 0515 05/31/15 0547 06/01/15 0514  WBC 16.4*  --  14.5*  < > 10.2 10.1 7.7 8.2 7.8  NEUTROABS 14.0*  --  11.7*  --   --   --   --   --   --   HGB 6.3*  < > 8.2*  < > 8.3* 8.0* 8.4* 8.7* 8.2*  HCT 19.7*  < > 24.7*  < > 25.1* 24.3* 25.9* 26.3* 26.0*  MCV 92.5  --  90.5  < > 90.0 91.0 90.6 91.3 91.5  PLT 309  --  276  < > 225 219 265 269 257  < > = values in this interval not displayed. Cardiac Enzymes:  Recent Labs Lab 05/26/15 2221 05/27/15 0519 05/27/15 1038 05/27/15 2209  TROPONINI <0.03 <0.03 <0.03 <0.03   CBG:  Recent Labs Lab 05/30/15 2137 05/31/15 0804 05/31/15 1216 05/31/15 1656 05/31/15 2346  GLUCAP 152* 93 185* 117* 123*    Recent Results (from the past 240 hour(s))  MRSA PCR Screening     Status:  None   Collection Time: 05/26/15  9:20 PM  Result Value Ref Range Status   MRSA by PCR NEGATIVE NEGATIVE Final    Comment:        The GeneXpert MRSA Assay (FDA approved for NASAL specimens only), is one component of a comprehensive MRSA colonization surveillance program. It is not intended to diagnose MRSA infection nor to guide or monitor treatment for MRSA infections.      Scheduled Meds: . clonazePAM  0.25 mg Oral QHS  . clotrimazole   Topical BID  . divalproex  500 mg Oral QHS  . memantine  28  mg Oral QHS   And  . donepezil  10 mg Oral QHS  . feeding supplement  1 Container Oral TID BM  . insulin aspart  0-9 Units Subcutaneous TID AC & HS  . levothyroxine  50 mcg Oral QAC breakfast  . nystatin   Topical BID  . pantoprazole  40 mg Oral BID  . PARoxetine  10 mg Oral Daily  . propranolol ER  80 mg Oral Daily  . sodium chloride  3 mL Intravenous Q12H   Continuous Infusions:

## 2015-06-02 LAB — GLUCOSE, CAPILLARY
Glucose-Capillary: 111 mg/dL — ABNORMAL HIGH (ref 65–99)
Glucose-Capillary: 189 mg/dL — ABNORMAL HIGH (ref 65–99)
Glucose-Capillary: 248 mg/dL — ABNORMAL HIGH (ref 65–99)
Glucose-Capillary: 61 mg/dL — ABNORMAL LOW (ref 65–99)

## 2015-06-02 MED ORDER — OXYCODONE HCL 5 MG PO TABS
5.0000 mg | ORAL_TABLET | ORAL | Status: DC | PRN
  Administered 2015-06-02: 5 mg via ORAL
  Filled 2015-06-02 (×2): qty 1

## 2015-06-02 NOTE — Progress Notes (Addendum)
Patient ID: Nicholas Olsen, male   DOB: Nov 01, 1933, 81 y.o.   MRN: [id number]  [facility] PROGRESS NOTE  BERNARDO BRAYMAN EYC:[id number] DOB: [birth year] DOA: 05/26/2015 PCP: Zigmund Gottron, MD   Brief narrative:    81 y.o. male presented with melena and near syncope event, consistent with presumed upper GI bleed. He additionally had new right facial droop on presentation concerning for stroke. PMH is significant for Non- Hogkins B cell lymphoma, remote h/o prostate cancer, HTN, DM2, HLD. Pt transferred [facility] to [facility] for consideration of palliative radiation therapy.   8/14: Admitted for melena and near syncopal event concerning for upper GI bleep 8/15: EGD --> multiple ulcerative gastric lesions concerning for worsening lymphoma tumor burden. Meeting with Primary oncologist and radiation oncologist for consideration of palliative radiation 8/16: Family meeting with Palliative care 8/17: Radiation simulation started by Dr. Valere Dross   Assessment/Plan:    Presumed Upper GI Bleed - ? Secondary to lymphoma recurrence  - Hg overall stable in the past 72 hours - EGD--> multiple gastric lesions concerning for progressing Lymphoma - continue Protonix PO  - continue to Hold home ASA  B cell lymphoma- EGD concerning for worsening tumor burden - Onc primary following, Rad Onc started with simulation 8/17, continue treatments  - pt unable to complete treatment 8/18, was agitated and not able to hold still  - treatments in progress  - appreciate assistance   Concern for stroke - CT head negative for stroke. Pt's wife feels right facial droop improving - Pt's family does not wish to pursue further stroke work up and state they are most interested in pursuing comfort care - Hold home ASA, no need for neurology consult as family does not want any further interventions  - PT evaluation done, HH PT recommended   Mild hypokalemia - supplemented and remains WNL this AM   Hypotension  - Improved with good perfusion after fluid repletion and 2 u pRBC's - continue to hold anti hypertensives as BP has been stable off of medications   Hypothyroidism - TSH 3.13 WNL - continue home synthroid  DM2- stable - SSI - CBG q6  Moderate PCM - encouraged PO intake  - changed diet to regular   Dementia - Progressing, at risk for sun downing - Continue home Namenda, donepezil  - Reduce stimuli, sitter if shows agitation, low dose haldol can be given if needed   Palliative: - Pt's wife, [name], expressed the family's wishes to pursue comfort care - plan to take home when ready   DVT prophylaxis - SCD's  Code Status: DNR Family Communication:  plan of care discussed with the patient, wife at bedside  Disposition Plan: To be determined but most likely home by Monday   IV access:  Peripheral IV  Procedures and diagnostic studies:    Ct Head Wo Contrast 05/26/2015  Stable atrophy and diffuse white matter disease. 2. No acute intracranial abnormality.    Ct Abdomen Pelvis W Contrast 05/17/2015   Suggestion of abnormal thickening of the gastric wall. Malignant involvement is not excluded.  Interval enlargement of soft tissue mass adjacent to lesser curvature of the stomach within the gastrohepatic ligament. Additional upper abdominal masses are grossly stable.  Grossly stable right lower lobe mass.    Portable Chest 1 View 05/26/2015  Stable bilateral lung masses.  No acute abnormality is noted.    Medical Consultants:  PCT Oncology Radiation oncology   Other Consultants:  PT  IAnti-Infectives:   None  Faye Ramsay, MD  [facility] Pager [phone]  If 7PM-7AM, please contact night-coverage [email or web address] Password [email or web address] 06/02/2015, 10:10 AM   LOS: 7 days   HPI/Subjective: No events overnight.   Objective: Filed Vitals:   06/01/15 0520 06/01/15 1500 06/01/15 2224 06/02/15 0502  BP: 112/61 120/78 116/61 106/57  Pulse: 58 54 59 56  Temp: 99 F (37.2 C) 97.5 F  (36.4 C) 98.5 F (36.9 C) 98.2 F (36.8 C)  TempSrc: Oral Oral Oral Oral  Resp: 18 18 18 12   Height:      Weight:      SpO2: 100% 94% 100% 99%    Intake/Output Summary (Last 24 hours) at 06/02/15 1010 Last data filed at 06/02/15 0700  Gross per 24 hour  Intake    330 ml  Output   1500 ml  Net  -1170 ml    Exam:   General:  Pt is sleeping, easy to awake, not in acute distress  Cardiovascular: Regular rate and rhythm,  no rubs, no gallops  Respiratory: Clear to auscultation bilaterally, diminished breath sounds at bases   Abdomen: Soft, non tender, non distended, bowel sounds present, no guarding  Data Reviewed: Basic Metabolic Panel:  Recent Labs Lab 05/27/15 0628 05/28/15 0958 05/30/15 0515 05/31/15 0547 06/01/15 0514  NA 141 139 139 136 137  K 3.4* 3.3* 3.1* 3.6 3.5  CL 108 108 105 104 105  CO2 27 22 25 25 27   GLUCOSE 107* 98 91 115* 105*  BUN 20 10 <5* 10 12  CREATININE 0.92 0.83 0.78 0.91 0.76  CALCIUM 8.4* 8.3* 8.5* 8.5* 8.5*   Liver Function Tests:  Recent Labs Lab 05/26/15 1240  AST 19  ALT 11*  ALKPHOS 49  BILITOT 0.2*  PROT 6.0*  ALBUMIN 2.7*   CBC:  Recent Labs Lab 05/26/15 1240  05/26/15 1737  05/27/15 2209 05/28/15 0958 05/30/15 0515 05/31/15 0547 06/01/15 0514  WBC 16.4*  --  14.5*  < > 10.2 10.1 7.7 8.2 7.8  NEUTROABS 14.0*  --  11.7*  --   --   --   --   --   --   HGB 6.3*  < > 8.2*  < > 8.3* 8.0* 8.4* 8.7* 8.2*  HCT 19.7*  < > 24.7*  < > 25.1* 24.3* 25.9* 26.3* 26.0*  MCV 92.5  --  90.5  < > 90.0 91.0 90.6 91.3 91.5  PLT 309  --  276  < > 225 219 265 269 257  < > = values in this interval not displayed. Cardiac Enzymes:  Recent Labs Lab 05/26/15 2221 05/27/15 0519 05/27/15 1038 05/27/15 2209  TROPONINI <0.03 <0.03 <0.03 <0.03   CBG:  Recent Labs Lab 05/31/15 2346 06/01/15 0755 06/01/15 1155 06/01/15 1632 06/01/15 2221  GLUCAP 123* 89 214* 97 101*    Recent Results (from the past 240 hour(s))  MRSA PCR  Screening     Status: None   Collection Time: 05/26/15  9:20 PM  Result Value Ref Range Status   MRSA by PCR NEGATIVE NEGATIVE Final    Comment:        The GeneXpert MRSA Assay (FDA approved for NASAL specimens only), is one component of a comprehensive MRSA colonization surveillance program. It is not intended to diagnose MRSA infection nor to guide or monitor treatment for MRSA infections.      Scheduled Meds: . clonazePAM  0.25 mg Oral QHS  . clotrimazole   Topical BID  . divalproex  500 mg Oral  QHS  . memantine  28 mg Oral QHS   And  . donepezil  10 mg Oral QHS  . feeding supplement  1 Container Oral TID BM  . insulin aspart  0-9 Units Subcutaneous TID AC & HS  . levothyroxine  50 mcg Oral QAC breakfast  . nystatin   Topical BID  . pantoprazole  40 mg Oral BID  . PARoxetine  10 mg Oral Daily  . propranolol ER  80 mg Oral Daily  . sodium chloride  3 mL Intravenous Q12H   Continuous Infusions:

## 2015-06-03 ENCOUNTER — Encounter: Payer: Self-pay | Admitting: Radiation Oncology

## 2015-06-03 ENCOUNTER — Ambulatory Visit
Admit: 2015-06-03 | Discharge: 2015-06-03 | Disposition: A | Discharge status: Home / Self Care | Payer: No Typology Code available for payment source | Attending: Radiation Oncology | Admitting: Radiation Oncology

## 2015-06-03 ENCOUNTER — Ambulatory Visit
Admit: 2015-06-03 | Discharge: 2015-06-03 | Disposition: A | Discharge status: Home / Self Care | Payer: Medicare PPO | Attending: Radiation Oncology | Admitting: Radiation Oncology

## 2015-06-03 VITALS — BP 123/64 | HR 53 | Temp 97.7°F

## 2015-06-03 DIAGNOSIS — C8330 Diffuse large B-cell lymphoma, unspecified site: Secondary | ICD-10-CM

## 2015-06-03 LAB — GLUCOSE, CAPILLARY
GLUCOSE-CAPILLARY: 76 mg/dL (ref 65–99)
GLUCOSE-CAPILLARY: 141 mg/dL — AB (ref 65–99)
GLUCOSE-CAPILLARY: 128 mg/dL — AB (ref 65–99)
Glucose-Capillary: 86 mg/dL (ref 65–99)

## 2015-06-03 LAB — CBC
HCT: 26.8 % — ABNORMAL LOW (ref 39.0–52.0)
Hemoglobin: 8.7 g/dL — ABNORMAL LOW (ref 13.0–17.0)
MCH: 30 pg (ref 26.0–34.0)
MCHC: 32.5 g/dL (ref 30.0–36.0)
MCV: 92.4 fL (ref 78.0–100.0)
PLATELETS: 314 10*3/uL (ref 150–400)
RBC: 2.9 MIL/uL — AB (ref 4.22–5.81)
RDW: 17.1 % — ABNORMAL HIGH (ref 11.5–15.5)
WBC: 9.9 10*3/uL (ref 4.0–10.5)

## 2015-06-03 LAB — BASIC METABOLIC PANEL
Anion gap: 6 (ref 5–15)
BUN: 12 mg/dL (ref 6–20)
CO2: 29 mmol/L (ref 22–32)
Calcium: 8.5 mg/dL — ABNORMAL LOW (ref 8.9–10.3)
Chloride: 102 mmol/L (ref 101–111)
Creatinine, Ser: 0.79 mg/dL (ref 0.61–1.24)
GFR calc Af Amer: >60 mL/min (ref >60)
GLUCOSE: 109 mg/dL — AB (ref 65–99)
POTASSIUM: 3.3 mmol/L — AB (ref 3.5–5.1)
Sodium: 137 mmol/L (ref 135–145)

## 2015-06-03 MED ORDER — POTASSIUM CHLORIDE CRYS ER 20 MEQ PO TBCR
40.0000 meq | EXTENDED_RELEASE_TABLET | Freq: Once | ORAL | Status: AC
  Administered 2015-06-03: 40 meq via ORAL
  Filled 2015-06-03: qty 2

## 2015-06-03 NOTE — Progress Notes (Signed)
Weekly Management Note:  Site: Stomach/abdomen Current Dose:  400  cGy Projected Dose: 2000  cGy followed by possible second course  Narrative: The patient is seen today for routine under treatment assessment. CBCT/MVCT images/port films were reviewed. The chart was reviewed.   He is been stable over the weekend.  No further melanoma or any bowel movements.  He denies pain.  His wife tells me he may be discharged tomorrow.  Physical Examination:  Filed Vitals:   06/03/15 1202  BP: 123/64  Pulse: 53  Temp: 97.7 F (36.5 C)  .  Weight:  .  No change.  Laboratory data:   Lab Results  Component Value Date   WBC 9.9 06/03/2015   HGB 8.7* 06/03/2015   HCT 26.8* 06/03/2015   MCV 92.4 06/03/2015   PLT 314 06/03/2015      Impression: Tolerating radiation therapy well.  Plan: Continue radiation therapy as planned.

## 2015-06-03 NOTE — Progress Notes (Signed)
Clinical Social Work  CSW met with patient's wife, dtr, and son-in-law at bedside. CSW explained that [facility] and [facility] offered beds but [facility] unsure if they can provide transportation or get insurance approval. [facility] agreeable to LOG if insurance not approved by tomorrow. Patient and family agreeable to [facility]. CSW spoke with SNF who is agreeable to submit for authorization. CSW will continue to follow.  [place], [place] [phone]

## 2015-06-03 NOTE — Progress Notes (Signed)
[place] [facility] Therapy Treatment Record Phone [phone]   Radiation Therapy was administered to Nicholas Olsen on: 06/03/2015  11:03 AM and was treatment # 1out of a planned course of 10 treatments.

## 2015-06-03 NOTE — Progress Notes (Signed)
Patient ID: Nicholas Olsen, male   DOB: 05-07-1934, 81 y.o.   MRN: [id number]  [facility] PROGRESS NOTE  THEDFORD BUNTON QMG:[id number] DOB: 05/06/1934 DOA: 05/26/2015 PCP: Zigmund Gottron, MD   Brief narrative:    81 y.o. male presented with melena and near syncope event, consistent with presumed upper GI bleed. He additionally had new right facial droop on presentation concerning for stroke. PMH is significant for Non- Hogkins B cell lymphoma, remote h/o prostate cancer, HTN, DM2, HLD. Pt transferred from [facility] to [facility] for consideration of palliative radiation therapy.   8/14: Admitted for melena and near syncopal event concerning for upper GI bleep 8/15: EGD --> multiple ulcerative gastric lesions concerning for worsening lymphoma tumor burden. Meeting with Primary oncologist and radiation oncologist for consideration of palliative radiation 8/16: Family meeting with Palliative care 8/17: Radiation simulation started by Dr. Valere Dross  8/22 - second radiation treatment provided   Assessment/Plan:    Presumed Upper GI Bleed - ? Secondary to lymphoma recurrence  - Hg overall stable in the past 72 hours - EGD--> multiple gastric lesions concerning for progressing Lymphoma - continue Protonix PO  - continue to Hold home ASA  B cell lymphoma- EGD concerning for worsening tumor burden - Onc primary following, Rad Onc started with simulation 8/17, continue treatments, today is #2 treatment  - appreciate assistance   Concern for stroke - CT head negative for stroke. Pt's wife feels right facial droop improving - Pt's family does not wish to pursue further stroke work up and state they are most interested in pursuing comfort care - Hold home ASA, no need for neurology consult as family does not want any further interventions  - PT evaluation done, HH PT recommended   Mild hypokalemia - continue to supplement and repeat BMP in AM  Hypotension - Improved with good perfusion  after fluid repletion and 2 u pRBC's - continue to hold anti hypertensives as BP has been stable off of medications   Hypothyroidism - TSH 3.13 WNL - continue home synthroid  DM2- stable - SSI - CBG q6  Moderate PCM - encouraged PO intake  - changed diet to regular   Dementia - Progressing, at risk for sun downing - Continue home Namenda, donepezil  - no sitter required at this time   Palliative: - Pt's wife, [name], expressed the family's wishes to pursue comfort care - plan to take home when ready   DVT prophylaxis - SCD's  Code Status: DNR Family Communication:  plan of care discussed with the patient, wife at bedside  Disposition Plan: SNF in 24 hours if bed available   IV access:  Peripheral IV  Procedures and diagnostic studies:    Ct Head Wo Contrast 05/26/2015  Stable atrophy and diffuse white matter disease. 2. No acute intracranial abnormality.    Ct Abdomen Pelvis W Contrast 05/17/2015   Suggestion of abnormal thickening of the gastric wall. Malignant involvement is not excluded.  Interval enlargement of soft tissue mass adjacent to lesser curvature of the stomach within the gastrohepatic ligament. Additional upper abdominal masses are grossly stable.  Grossly stable right lower lobe mass.    Portable Chest 1 View 05/26/2015  Stable bilateral lung masses.  No acute abnormality is noted.    Medical Consultants:  PCT Oncology Radiation oncology   Other Consultants:  PT  IAnti-Infectives:   None   Faye Ramsay, MD  [facility] Pager [phone]  If 7PM-7AM, please contact night-coverage [email or web address] Password [facility] 06/03/2015, 4:03 PM  LOS: 8 days   HPI/Subjective: No events overnight.   Objective: Filed Vitals:   06/02/15 1500 06/02/15 2136 06/03/15 0605 06/03/15 1341  BP: 120/57 120/75 118/63 135/68  Pulse: 52 56 55 58  Temp: 97.6 F (36.4 C) 98 F (36.7 C) 97.7 F (36.5 C) 98.4 F (36.9 C)  TempSrc: Oral Oral Oral Oral  Resp: 16 17 18 20    Height:      Weight:      SpO2: 100% 100% 99% 100%    Intake/Output Summary (Last 24 hours) at 06/03/15 1603 Last data filed at 06/03/15 1114  Gross per 24 hour  Intake    480 ml  Output    500 ml  Net    -20 ml    Exam:   General:  Pt is sleeping, easy to awake, not in acute distress  Cardiovascular: Regular rate and rhythm,  no rubs, no gallops  Respiratory: Clear to auscultation bilaterally, diminished breath sounds at bases   Abdomen: Soft, non tender, non distended, bowel sounds present, no guarding  Data Reviewed: Basic Metabolic Panel:  Recent Labs Lab 05/28/15 0958 05/30/15 0515 05/31/15 0547 06/01/15 0514 06/03/15 0510  NA 139 139 136 137 137  K 3.3* 3.1* 3.6 3.5 3.3*  CL 108 105 104 105 102  CO2 22 25 25 27 29   GLUCOSE 98 91 115* 105* 109*  BUN 10 <5* 10 12 12   CREATININE 0.83 0.78 0.91 0.76 0.79  CALCIUM 8.3* 8.5* 8.5* 8.5* 8.5*   CBC:  Recent Labs Lab 05/28/15 0958 05/30/15 0515 05/31/15 0547 06/01/15 0514 06/03/15 0510  WBC 10.1 7.7 8.2 7.8 9.9  HGB 8.0* 8.4* 8.7* 8.2* 8.7*  HCT 24.3* 25.9* 26.3* 26.0* 26.8*  MCV 91.0 90.6 91.3 91.5 92.4  PLT 219 265 269 257 314   Cardiac Enzymes:  Recent Labs Lab 05/27/15 2209  TROPONINI <0.03   CBG:  Recent Labs Lab 06/02/15 1507 06/02/15 1658 06/02/15 2300 06/02/15 2340 06/03/15 0818  GLUCAP 248* 189* 61* 111* 76    Recent Results (from the past 240 hour(s))  MRSA PCR Screening     Status: None   Collection Time: 05/26/15  9:20 PM  Result Value Ref Range Status   MRSA by PCR NEGATIVE NEGATIVE Final    Comment:        The GeneXpert MRSA Assay (FDA approved for NASAL specimens only), is one component of a comprehensive MRSA colonization surveillance program. It is not intended to diagnose MRSA infection nor to guide or monitor treatment for MRSA infections.      Scheduled Meds: . clonazePAM  0.25 mg Oral QHS  . clotrimazole   Topical BID  . divalproex  500 mg Oral QHS   . memantine  28 mg Oral QHS   And  . donepezil  10 mg Oral QHS  . feeding supplement  1 Container Oral TID BM  . insulin aspart  0-9 Units Subcutaneous TID AC & HS  . levothyroxine  50 mcg Oral QAC breakfast  . nystatin   Topical BID  . pantoprazole  40 mg Oral BID  . PARoxetine  10 mg Oral Daily  . propranolol ER  80 mg Oral Daily  . sodium chloride  3 mL Intravenous Q12H   Continuous Infusions:

## 2015-06-03 NOTE — Clinical Social Work Note (Signed)
Clinical Social Work Assessment  Patient Details  Name: Nicholas Olsen MRN: [id number] Date of Birth: [birth year]  Date of referral:  06/03/15               Reason for consult:  Discharge Planning                Permission sought to share information with:  Family Supports Permission granted to share information::  Yes, Verbal Permission Granted  Name::     Therapist, occupational::     Relationship::  spouse  Contact Information:     Housing/Transportation Living arrangements for the past 2 months:  Single Family Home Source of Information:  Spouse Patient Interpreter Needed:  None Criminal Activity/Legal Involvement Pertinent to Current Situation/Hospitalization:  No - Comment as needed Significant Relationships:  Spouse, Adult Children Lives with:  Spouse Do you feel safe going back to the place where you live?  No Need for family participation in patient care:  Yes (Comment)  Care giving concerns:  Wife feels that patient needs more assistance than she can provide is hopeful for DC to SNF.   Social Worker assessment / plan:  CSW spoke with attending MD and PMT NP at progression meeting. PT had previously assessed patient and recommended HH but MD feels patient might have declined since then. CSW met with patient and wife at bedside to discuss DC plans. Wife reports that patient is weak and that she is concerned about transporting him to and from the cancer center. CSW explained that insurance approval would be needed or family would have to pay privately. RN to contact PT re: evaluating patient today.   Patient and family agreeable to [facility] and [facility] search. CSW spoke with dtr via phone and answered questions as well.  Family is hopeful for a facility that is close to home but understanding of barriers with finding a facility that is in-network with insurance and can transport patient to cancer center.  CSW completed FL2 and faxed out. CSW will follow up with bed  offers.  Employment status:  Retired Nurse, adult PT Recommendations:  Home with [place] (PT to re-evaluate patient today) Information / Referral to community resources:  [place]  Patient/Family's Response to care:  Wife reports feeling overwhelmed with making decisions and reports she wants what is best for patient.  Patient/Family's Understanding of and Emotional Response to Diagnosis, Current Treatment, and Prognosis:  Wife and patient have been meeting with PMT to discuss [place] for patient. Patient has been receiving radiation and wife is hopeful that patient will recover quickly.  Emotional Assessment Appearance:  Appears stated age Attitude/Demeanor/Rapport:    Affect (typically observed):  Flat Orientation:  Oriented to Self Alcohol / Substance use:    Psych involvement (Current and /or in the community):  No (Comment)  Discharge Needs  Concerns to be addressed:  No discharge needs identified Readmission within the last 30 days:  No Current discharge risk:  None Barriers to Discharge:  No Barriers Identified   Boone Master, [place] 06/03/2015, 10:38 AM [phone]

## 2015-06-03 NOTE — Progress Notes (Signed)
Physical Therapy Treatment Patient Details Name: Nicholas Olsen MRN: [id number] DOB: 11/22/1933 Today's Date: 06/03/2015    History of Present Illness 81 yo M with known PMH of large B cell lymphoma, DM2, dementia, hyperthyroidism, HTN who presents with pre-syncope and melena.     PT Comments    Significant decline in mobility noted today. +2 max assist for sit to stand and to walk 6'. Pt is high fall risk. Wheelchair recommended for mobility. Pt's wife not able to manage pt's care at home. SNF recommended.   Follow Up Recommendations  SNF;Supervision/Assistance - 24 hour     Equipment Recommendations  Wheelchair (measurements PT)    Recommendations for Other Services       Precautions / Restrictions Precautions Precautions: Fall Restrictions Weight Bearing Restrictions: No    Mobility  Bed Mobility Overal bed mobility: Needs Assistance Bed Mobility: Supine to Sit     Supine to sit: Mod assist     General bed mobility comments: verbal and manual cues for technique, HOB up 35*, assist to initiate movement, increased time  Transfers Overall transfer level: Needs assistance Equipment used: None Transfers: Sit to/from Stand Sit to Stand: +2 physical assistance;+2 safety/equipment;From elevated surface;Max assist         General transfer comment: Max assist of 2 to rise, pt 40%, initial posterior lean in standing, pt stood with flexed knees and trunk  Ambulation/Gait Ambulation/Gait assistance: +2 physical assistance;Max assist Ambulation Distance (Feet): 6 Feet Assistive device: 2 person hand held assist Gait Pattern/deviations: Shuffle;Decreased step length - right;Decreased step length - left;Trunk flexed   Gait velocity interpretation: Below normal speed for age/gender General Gait Details: hand held assist of 2 with max A for balance, max verbal cues for technique, shuffling gait with B knees flexed   Stairs            Wheelchair Mobility     Modified Rankin (Stroke Patients Only)       Balance   Sitting-balance support: Feet supported;Bilateral upper extremity supported Sitting balance-Leahy Scale: Poor Sitting balance - Comments: requires BUE/LE support     Standing balance-Leahy Scale: Zero                      Cognition Arousal/Alertness: Awake/alert Behavior During Therapy: WFL for tasks assessed/performed Overall Cognitive Status: History of cognitive impairments - at baseline                      Exercises      General Comments        Pertinent Vitals/Pain Pain Assessment: No/denies pain Faces Pain Scale: No hurt    Home Living                      Prior Function            PT Goals (current goals can now be found in the care plan section) Acute Rehab PT Goals Patient Stated Goal: pt's wife would like pt to get stronger, agrees to SNF PT Goal Formulation: With family Time For Goal Achievement: 06/12/15 Potential to Achieve Goals: Fair Progress towards PT goals: Not progressing toward goals - comment (decline in mobility)    Frequency  Min 3X/week    PT Plan Discharge plan needs to be updated    Co-evaluation PT/OT/SLP Co-Evaluation/Treatment: Yes           End of Session Equipment Utilized During Treatment: Gait belt Activity Tolerance: Patient limited by  fatigue Patient left: in chair;with call bell/phone within reach;with chair alarm set;with family/visitor present     Time: 9417-4081 PT Time Calculation (min) (ACUTE ONLY): 18 min  Charges:  $Therapeutic Activity: 8-22 mins                    G Codes:      Philomena Doheny 06/03/2015, 1:42 PM [phone]

## 2015-06-03 NOTE — Progress Notes (Signed)
Date:  June 03, 2015 U.R. performed for needs and level of care. Will continue to follow for Case Management needs.  Velva Harman, RN, BSN, [place]   [phone]

## 2015-06-03 NOTE — Clinical Social Work Placement (Signed)
   CLINICAL SOCIAL WORK PLACEMENT  NOTE  Date:  06/03/2015  Patient Details  Name: Nicholas Olsen MRN: [id number] Date of Birth: Jun 10, 1934  Clinical Social Work is seeking post-discharge placement for this patient at the [place] level of care (*CSW will initial, date and re-position this form in  chart as items are completed):  Yes   Patient/family provided with [place] Work Department's list of facilities offering this level of care within the geographic area requested by the patient (or if unable, by the patient's family).  Yes   Patient/family informed of their freedom to choose among providers that offer the needed level of care, that participate in Medicare, Medicaid or managed care program needed by the patient, have an available bed and are willing to accept the patient.  Yes   Patient/family informed of [place]'s ownership interest [facility], as well as of the fact that they are under no obligation to receive care at these facilities.  PASRR submitted to EDS on 06/03/15     PASRR number received on 06/03/15     Existing PASRR number confirmed on       FL2 transmitted to all facilities in geographic area requested by pt/family on 06/03/15     FL2 transmitted to all facilities within larger geographic area on       Patient informed that his/her managed care company has contracts with or will negotiate with certain facilities, including the following:            Patient/family informed of bed offers received.  Patient chooses bed at       Physician recommends and patient chooses bed at      Patient to be transferred to   on  .  Patient to be transferred to facility by       Patient family notified on   of transfer.  Name of family member notified:        PHYSICIAN       Additional Comment:    _______________________________________________ Boone Master, [place] 06/03/2015, 10:59 AM

## 2015-06-04 ENCOUNTER — Other Ambulatory Visit: Payer: Self-pay | Admitting: *Deleted

## 2015-06-04 ENCOUNTER — Ambulatory Visit
Admit: 2015-06-04 | Discharge: 2015-06-04 | Disposition: A | Discharge status: Home / Self Care | Payer: Medicare PPO | Attending: Radiation Oncology | Admitting: Radiation Oncology

## 2015-06-04 LAB — CBC
HEMATOCRIT: 25.5 % — AB (ref 39.0–52.0)
HEMOGLOBIN: 8 g/dL — AB (ref 13.0–17.0)
MCH: 28.9 pg (ref 26.0–34.0)
MCHC: 31.4 g/dL (ref 30.0–36.0)
MCV: 92.1 fL (ref 78.0–100.0)
Platelets: 293 10*3/uL (ref 150–400)
RBC: 2.77 MIL/uL — AB (ref 4.22–5.81)
RDW: 16.9 % — ABNORMAL HIGH (ref 11.5–15.5)
WBC: 8.5 10*3/uL (ref 4.0–10.5)

## 2015-06-04 LAB — BASIC METABOLIC PANEL
ANION GAP: 7 (ref 5–15)
BUN: 13 mg/dL (ref 6–20)
CHLORIDE: 102 mmol/L (ref 101–111)
CO2: 28 mmol/L (ref 22–32)
Calcium: 8.6 mg/dL — ABNORMAL LOW (ref 8.9–10.3)
Creatinine, Ser: 0.8 mg/dL (ref 0.61–1.24)
GFR calc non Af Amer: >60 mL/min (ref >60)
Glucose, Bld: 79 mg/dL (ref 65–99)
POTASSIUM: 3.5 mmol/L (ref 3.5–5.1)
Sodium: 137 mmol/L (ref 135–145)

## 2015-06-04 LAB — GLUCOSE, CAPILLARY
GLUCOSE-CAPILLARY: 77 mg/dL (ref 65–99)
GLUCOSE-CAPILLARY: 87 mg/dL (ref 65–99)
GLUCOSE-CAPILLARY: 89 mg/dL (ref 65–99)

## 2015-06-04 MED ORDER — DIVALPROEX SODIUM 125 MG PO CSDR: 500.0000 mg | DELAYED_RELEASE_CAPSULE | Freq: Every day | ORAL | Status: DC

## 2015-06-04 MED ORDER — OXYCODONE HCL 5 MG PO TABS: 5.0000 mg | ORAL_TABLET | ORAL | Status: DC | PRN

## 2015-06-04 MED ORDER — CLONAZEPAM 0.5 MG PO TABS: 0.2500 mg | ORAL_TABLET | Freq: Every day | ORAL | Status: DC

## 2015-06-04 MED ORDER — TRAMADOL HCL 50 MG PO TABS
50.0000 mg | ORAL_TABLET | Freq: Three times a day (TID) | ORAL | Status: DC | PRN
Start: 2015-06-04 — End: 2015-06-14

## 2015-06-04 MED ORDER — PANTOPRAZOLE SODIUM 40 MG PO TBEC: 40.0000 mg | DELAYED_RELEASE_TABLET | Freq: Every day | ORAL | Status: DC

## 2015-06-04 NOTE — Progress Notes (Signed)
During rounds, chapian visited patient and wife. Chaplain provided a listening presence as well as support. Chaplain   06/04/15 1200  Clinical Encounter Type  Visited With Patient and family together  Visit Type Initial;Spiritual support;Social support  Referral From Chaplain  Consult/Referral To Chaplain   listened as wife spoke of her faith and life. Wife also spoke of patient leaving for SNF. Chaplain services available upon request.

## 2015-06-04 NOTE — Care Management Important Message (Signed)
Important Message  Patient Details  Name: Nicholas Olsen MRN: [id number] Date of Birth: Apr 16, 1934   Medicare Important Message Given:  Yes-third notification given    Camillo Flaming 06/04/2015, 12:22 [place] Message  Patient Details  Name: Nicholas Olsen MRN: [id number] Date of Birth: 11/27/1933   Medicare Important Message Given:  Yes-third notification given    Camillo Flaming 06/04/2015, 12:21 PM

## 2015-06-04 NOTE — Discharge Summary (Signed)
Physician Discharge Summary  Nicholas Olsen DVV:[id number] DOB: [birth year] DOA: 05/26/2015  PCP: Zigmund Gottron, MD  Admit date: 05/26/2015 Discharge date: 06/04/2015  Recommendations for Outpatient Follow-up:  1. Pt will need to follow up with PCP in 2-3 weeks post discharge 2. Please obtain BMP to evaluate electrolytes and kidney function 3. Please also check CBC to evaluate Hg and Hct levels 4. Please note that aspirin have been stopped due to bleeding 5. Please note also Norvasc and Losartan have been stopped due to low BP and can be resumed if needed  6. Please take foley out in next week with viding trial  7. Pt is currently on treatment #3/9 at [facility] radiation center   Discharge Diagnoses:  Active Problems:   Melena   Pressure ulcer   GI bleed   Acute upper GI bleed   Gastrointestinal hemorrhage with melena   Pre-syncope   Dementia   Palliative care encounter   DNR (do not resuscitate) discussion   Weakness generalized   Malnutrition of moderate degree   Agitation   Non-Hodgkin lymphoma   Discharge Condition: Stable  Diet recommendation: Heart healthy diet discussed in details    Brief narrative:    81 y.o. male presented with melena and near syncope event, consistent with presumed upper GI bleed. He additionally had new right facial droop on presentation concerning for stroke. PMH is significant for Non- Hogkins B cell lymphoma, remote h/o prostate cancer, HTN, DM2, HLD. Pt transferred [facility] to [facility] for consideration of palliative radiation therapy.   8/14: Admitted for melena and near syncopal event concerning for upper GI bleep 8/15: EGD --> multiple ulcerative gastric lesions concerning for worsening lymphoma tumor burden. Meeting with Primary oncologist and radiation oncologist for consideration of palliative radiation 8/16: Family meeting with Palliative care 8/17: Radiation simulation started by Dr. Valere Dross   Assessment/Plan:    Presumed Upper  GI Bleed - ? Secondary to lymphoma recurrence  - Hg overall stable in the past 72 hours - EGD--> multiple gastric lesions concerning for progressing Lymphoma - continue Protonix PO  - continue to Hold home ASA  B cell lymphoma- EGD concerning for worsening tumor burden - Onc primary following, Rad Onc started with simulation 8/17, continue treatments  - pt unable to complete treatment 8/18, was agitated and not able to hold still  - treatments in progress, #3/9 today  - appreciate assistance   Concern for stroke - CT head negative for stroke. Pt's wife feels right facial droop improving - Pt's family does not wish to pursue further stroke work up and state they are most interested in pursuing comfort care - Hold home ASA, no need for neurology consult as family does not want any further interventions   Mild hypokalemia - supplemented and remains WNL this AM   Hypotension - Improved with good perfusion after fluid repletion and 2 u pRBC's - continue to hold anti hypertensives as BP has been stable off of medications   Hypothyroidism - TSH 3.13 WNL - continue home synthroid  DM2- stable  Moderate PCM - encouraged PO intake  - changed diet to regular   Dementia - Progressing, at risk for sun downing - Continue home regimen  - Reduce stimuli, sitter if shows agitation, low dose haldol can be given if needed   Palliative: - Pt's wife, [name], expressed the family's wishes to pursue comfort care   Code Status: DNR Family Communication: plan of care discussed with the patient, wife at bedside  Disposition  Plan:SNF  IV access:  Peripheral IV  Procedures and diagnostic studies:   Ct Head Wo Contrast 05/26/2015 Stable atrophy and diffuse white matter disease. 2. No acute intracranial abnormality.   Ct Abdomen Pelvis W Contrast 05/17/2015 Suggestion of abnormal thickening of the gastric wall. Malignant involvement is not excluded. Interval enlargement of  soft tissue mass adjacent to lesser curvature of the stomach within the gastrohepatic ligament. Additional upper abdominal masses are grossly stable. Grossly stable right lower lobe mass.   Portable Chest 1 View 05/26/2015 Stable bilateral lung masses. No acute abnormality is noted.   Medical Consultants:  PCT Oncology Radiation oncology   Other Consultants:  PT  IAnti-Infectives:   None   Faye Ramsay, MD [facility] Pager [phone]      Discharge Exam: Filed Vitals:   06/04/15 0558  BP: 112/57  Pulse: 63  Temp: 99.1 F (37.3 C)  Resp: 16   Filed Vitals:   06/03/15 0605 06/03/15 1341 06/03/15 2033 06/04/15 0558  BP: 118/63 135/68 148/74 112/57  Pulse: 55 58 61 63  Temp: 97.7 F (36.5 C) 98.4 F (36.9 C) 98.7 F (37.1 C) 99.1 F (37.3 C)  TempSrc: Oral Oral Oral Oral  Resp: 18 20 18 16   Height:      Weight:      SpO2: 99% 100% 100% 98%    General: Pt is alert, follows commands appropriately, not in acute distress Cardiovascular: Regular rate and rhythm, no rubs, no gallops Respiratory: Clear to auscultation bilaterally, no wheezing, no crackles, no rhonchi Abdominal: Soft, non tender, non distended, bowel sounds +, no guarding  Discharge Instructions  Discharge Instructions    Diet - low sodium heart healthy    Complete by:  As directed      Increase activity slowly    Complete by:  As directed             Medication List    STOP taking these medications        ALPRAZolam 0.25 MG tablet  Commonly known as:  XANAX     amLODipine 10 MG tablet  Commonly known as:  NORVASC     aspirin 81 MG tablet     atorvastatin 10 MG tablet  Commonly known as:  LIPITOR     losartan 100 MG tablet  Commonly known as:  COZAAR     Memantine HCl-Donepezil HCl 28-10 MG Cp24  Commonly known as:  NAMZARIC      TAKE these medications        b complex vitamins tablet  Take 1 tablet by mouth every morning.     cetirizine 10 MG tablet   Commonly known as:  ZYRTEC  Take 10 mg by mouth daily as needed for allergies.     clonazePAM 0.5 MG tablet  Commonly known as:  KLONOPIN  Take 0.5 tablets (0.25 mg total) by mouth at bedtime.     clotrimazole-betamethasone cream  Commonly known as:  LOTRISONE  APPLY TOPICALLY TWICE A DAY FOR GROIN RASH     divalproex 125 MG capsule  Commonly known as:  DEPAKOTE SPRINKLE  Take 4 capsules (500 mg total) by mouth at bedtime.     levothyroxine 50 MCG tablet  Commonly known as:  SYNTHROID, LEVOTHROID  Take 50 mcg by mouth daily before breakfast.     magnesium oxide 400 MG tablet  Commonly known as:  MAG-OX  Take 1 tablet (400 mg total) by mouth daily.     memantine 10 MG tablet  Commonly known as:  NAMENDA  Take 10 mg by mouth 2 (two) times daily.     oxyCODONE 5 MG immediate release tablet  Commonly known as:  Oxy IR/ROXICODONE  Take 1 tablet (5 mg total) by mouth every 4 (four) hours as needed for moderate pain or severe pain.     pantoprazole 40 MG tablet  Commonly known as:  PROTONIX  Take 1 tablet (40 mg total) by mouth daily.     PARoxetine 10 MG tablet  Commonly known as:  PAXIL  Take 1 tablet (10 mg total) by mouth daily.     propranolol ER 80 MG 24 hr capsule  Commonly known as:  INDERAL LA  Take 1 capsule (80 mg total) by mouth daily.     ranitidine 150 MG tablet  Commonly known as:  ZANTAC  Take 150 mg by mouth daily after lunch.     traMADol 50 MG tablet  Commonly known as:  ULTRAM  Take 1 tablet (50 mg total) by mouth every 8 (eight) hours as needed.            Follow-up Information    Follow up with Zigmund Gottron, MD.   Specialty:  Family Medicine   Contact information:   [place] [address] [phone]       Follow up with Faye Ramsay, MD.   Specialty:  Internal Medicine   Why:  As needed call my cell [phone]   Contact information:   [address] [place]  [address] [phone]        The results of significant diagnostics from this hospitalization (including imaging, microbiology, ancillary and laboratory) are listed below for reference.     Microbiology: Recent Results (from the past 240 hour(s))  MRSA PCR Screening     Status: None   Collection Time: 05/26/15  9:20 PM  Result Value Ref Range Status   MRSA by PCR NEGATIVE NEGATIVE Final    Comment:        The GeneXpert MRSA Assay (FDA approved for NASAL specimens only), is one component of a comprehensive MRSA colonization surveillance program. It is not intended to diagnose MRSA infection nor to guide or monitor treatment for MRSA infections.      Labs: Basic Metabolic Panel:  Recent Labs Lab 05/30/15 0515 05/31/15 0547 06/01/15 0514 06/03/15 0510 06/04/15 0553  NA 139 136 137 137 137  K 3.1* 3.6 3.5 3.3* 3.5  CL 105 104 105 102 102  CO2 25 25 27 29 28   GLUCOSE 91 115* 105* 109* 79  BUN <5* 10 12 12 13   CREATININE 0.78 0.91 0.76 0.79 0.80  CALCIUM 8.5* 8.5* 8.5* 8.5* 8.6*   CBC:  Recent Labs Lab 05/30/15 0515 05/31/15 0547 06/01/15 0514 06/03/15 0510 06/04/15 0553  WBC 7.7 8.2 7.8 9.9 8.5  HGB 8.4* 8.7* 8.2* 8.7* 8.0*  HCT 25.9* 26.3* 26.0* 26.8* 25.5*  MCV 90.6 91.3 91.5 92.4 92.1  PLT 265 269 257 314 293   CBG:  Recent Labs Lab 06/03/15 0818 06/03/15 1254 06/03/15 1714 06/03/15 2035 06/04/15 0721  GLUCAP 76 86 141* 128* 77     SIGNED: Time coordinating discharge: 30 minutes  MAGICK-Cyriah Childrey, MD  [facility] 06/04/2015, 9:28 AM Pager [phone]  If 7PM-7AM, please contact night-coverage [email or web address] Password [email or web address]

## 2015-06-04 NOTE — Progress Notes (Signed)
Clinical Social Work  CSW spoke with [facility] who has received insurance authorization and family has completed paperwork. CSW prepared DC packet with DC summary, DNR form, FL2, and hard scripts included. CSW faxed DC summary to [facility]. Patient and wife agreeable to DC to SNF today. PTAR arranged per family request who is aware of no guarantee of payment. RN to call report.   [facility]AR arranged at 4:30pm. Request #: [id number].  CSW is signing off but available if needed.  [place], [place] [phone]

## 2015-06-04 NOTE — Clinical Social Work Placement (Signed)
   CLINICAL SOCIAL WORK PLACEMENT  NOTE  Date:  06/04/2015  Patient Details  Name: Nicholas Olsen MRN: [id number] Date of Birth: Sep 19, 1934  Clinical Social Work is seeking post-discharge placement for this patient at the [place] level of care (*CSW will initial, date and re-position this form in  chart as items are completed):  Yes   Patient/family provided with [place] Work Department's list of facilities offering this level of care within the geographic area requested by the patient (or if unable, by the patient's family).  Yes   Patient/family informed of their freedom to choose among providers that offer the needed level of care, that participate in Medicare, Medicaid or managed care program needed by the patient, have an available bed and are willing to accept the patient.  Yes   Patient/family informed of [facility]'s ownership interest in [facility] and [facility], as well as of the fact that they are under no obligation to receive care at these facilities.  PASRR submitted to EDS on 06/03/15     PASRR number received on 06/03/15     Existing PASRR number confirmed on       FL2 transmitted to all facilities in geographic area requested by pt/family on 06/03/15     FL2 transmitted to all facilities within larger geographic area on       Patient informed that his/her managed care company has contracts with or will negotiate with certain facilities, including the following:        Yes   Patient/family informed of bed offers received.  Patient chooses bed at [facility]     Physician recommends and patient chooses bed at      Patient to be transferred to [facility] on 06/04/15.  Patient to be transferred to facility by PTAR     Patient family notified on 06/04/15 of transfer.  Name of family member notified:  Wife at bedside     PHYSICIAN       Additional Comment:     _______________________________________________ Boone Master, [place] 06/04/2015, 1:19 PM

## 2015-06-04 NOTE — Discharge Instructions (Signed)
Bloody Stools °Bloody stools means there is blood in your poop (stool). It is a sign that there is a problem somewhere in the digestive system. It is important for your doctor to find the cause of your bleeding, so the problem can be treated.  °HOME CARE °· Only take medicine as told by your doctor. °· Eat foods with fiber (prunes, bran cereals). °· Drink enough fluids to keep your pee (urine) clear or pale yellow. °· Sit in warm water (sitz bath) for 10 to 15 minutes as told by your doctor. °· Know how to take your medicines (enemas, suppositories) if advised by your doctor. °· Watch for signs that you are getting better or getting worse. °GET HELP RIGHT AWAY IF:  °· You are not getting better. °· You start to get better but then get worse again. °· You have new problems. °· You have severe bleeding from the place where poop comes out (rectum) that does not stop. °· You throw up (vomit) blood. °· You feel weak or pass out (faint). °· You have a fever. °MAKE SURE YOU:  °· Understand these instructions. °· Will watch your condition. °· Will get help right away if you are not doing well or get worse. °Document Released: 09/16/2009 Document Revised: 12/21/2011 Document Reviewed: 02/13/2011 °ExitCare® Patient Information ©2015 [employer]. This information is not intended to replace advice given to you by your health care provider. Make sure you discuss any questions you have with your health care provider. ° °

## 2015-06-05 ENCOUNTER — Ambulatory Visit
Admit: 2015-06-05 | Discharge: 2015-06-05 | Disposition: A | Discharge status: Home / Self Care | Payer: Medicare PPO | Attending: Radiation Oncology | Admitting: Radiation Oncology

## 2015-06-05 DIAGNOSIS — C8330 Diffuse large B-cell lymphoma, unspecified site: Secondary | ICD-10-CM | POA: Diagnosis present

## 2015-06-05 DIAGNOSIS — Z510 Encounter for antineoplastic radiation therapy: Secondary | ICD-10-CM | POA: Diagnosis present

## 2015-06-06 ENCOUNTER — Ambulatory Visit
Admit: 2015-06-06 | Discharge: 2015-06-06 | Disposition: A | Discharge status: Home / Self Care | Payer: Medicare PPO | Attending: Radiation Oncology | Admitting: Radiation Oncology

## 2015-06-06 ENCOUNTER — Ambulatory Visit: Payer: Medicare PPO | Admitting: Podiatry

## 2015-06-06 DIAGNOSIS — Z510 Encounter for antineoplastic radiation therapy: Secondary | ICD-10-CM | POA: Diagnosis not present

## 2015-06-07 ENCOUNTER — Ambulatory Visit
Admit: 2015-06-07 | Discharge: 2015-06-07 | Disposition: A | Discharge status: Home / Self Care | Payer: Medicare PPO | Attending: Radiation Oncology | Admitting: Radiation Oncology

## 2015-06-07 DIAGNOSIS — Z510 Encounter for antineoplastic radiation therapy: Secondary | ICD-10-CM | POA: Diagnosis not present

## 2015-06-10 ENCOUNTER — Ambulatory Visit
Admission: RE | Admit: 2015-06-10 | Discharge: 2015-06-10 | Disposition: A | Discharge status: Home / Self Care | Payer: Medicare PPO | Source: Ambulatory Visit | Attending: Radiation Oncology | Admitting: Radiation Oncology

## 2015-06-10 ENCOUNTER — Ambulatory Visit
Admit: 2015-06-10 | Discharge: 2015-06-10 | Disposition: A | Discharge status: Home / Self Care | Payer: Medicare PPO | Attending: Radiation Oncology | Admitting: Radiation Oncology

## 2015-06-10 ENCOUNTER — Encounter: Payer: Self-pay | Admitting: Radiation Oncology

## 2015-06-10 VITALS — BP 128/69 | HR 53 | Temp 98.1°F | Resp 16 | Ht 74.0 in

## 2015-06-10 DIAGNOSIS — C61 Malignant neoplasm of prostate: Secondary | ICD-10-CM | POA: Insufficient documentation

## 2015-06-10 DIAGNOSIS — C8330 Diffuse large B-cell lymphoma, unspecified site: Secondary | ICD-10-CM | POA: Insufficient documentation

## 2015-06-10 DIAGNOSIS — Z510 Encounter for antineoplastic radiation therapy: Secondary | ICD-10-CM | POA: Diagnosis not present

## 2015-06-10 MED ORDER — BIAFINE EX EMUL
Freq: Once | CUTANEOUS | Status: AC
  Administered 2015-06-10: 18:00:00 via TOPICAL

## 2015-06-10 NOTE — Progress Notes (Signed)
Weekly Management Note:  Site: stomach/abdomen Current Dose:   1400  cGy Projected Dose:  2000  cGy  Narrative: The patient is seen today for routine under treatment assessment. CBCT/MVCT images/port films were reviewed. The chart was reviewed.    He is currently at [facility]. His wife tells me that his appetite is poor and has not yet returned. He does have loose bowels but no tarry stools or evidence for GI bleeding.  He also reports dryness of his skin and itching along his abdomen. He was given Biafine cream to use when necessary.  Physical Examination:  Filed Vitals:   06/10/15 1454  BP: 128/69  Pulse: 53  Temp: 98.1 F (36.7 C)  Resp: 16  .  Weight:  .  Abdominal examination is unremarkable.  Impression: Tolerating radiation therapy well.  He will finish his radiation therapy this Thursday.  Plan: Continue radiation therapy as planned. Follow-up visit with me 2 weeks after completion of radiation therapy.

## 2015-06-10 NOTE — Addendum Note (Signed)
Encounter addended by: Jacqulyn Liner, RN on: 06/10/2015  5:57 PM<BR>     Documentation filed: Inpatient MAR

## 2015-06-10 NOTE — Progress Notes (Signed)
Nicholas Olsen has completed 7 fractions to his abdomen.  He denies pain and nausea.  His wife reports his appetite is very poor.  She also reports he is having 2-3 loose stools per day.  She reports the skin on his abdomen is itching especially at night.  He has been given a tube of biafine.   He is currently living at [facility].    BP 128/69 mmHg  Pulse 53  Temp(Src) 98.1 F (36.7 C) (Oral)  Resp 16  Ht 6\' 2"  (1.88 m)  SpO2 99%

## 2015-06-11 ENCOUNTER — Ambulatory Visit
Admit: 2015-06-11 | Discharge: 2015-06-11 | Disposition: A | Discharge status: Home / Self Care | Payer: Medicare PPO | Attending: Radiation Oncology | Admitting: Radiation Oncology

## 2015-06-11 ENCOUNTER — Ambulatory Visit (HOSPITAL_BASED_OUTPATIENT_CLINIC_OR_DEPARTMENT_OTHER): Payer: Medicare PPO | Admitting: Nurse Practitioner

## 2015-06-11 ENCOUNTER — Telehealth: Payer: Self-pay | Admitting: Oncology

## 2015-06-11 VITALS — BP 126/65 | HR 65 | Temp 98.3°F | Resp 18 | Ht 74.0 in | Wt 170.5 lb

## 2015-06-11 DIAGNOSIS — I10 Essential (primary) hypertension: Secondary | ICD-10-CM | POA: Diagnosis not present

## 2015-06-11 DIAGNOSIS — E119 Type 2 diabetes mellitus without complications: Secondary | ICD-10-CM

## 2015-06-11 DIAGNOSIS — Z510 Encounter for antineoplastic radiation therapy: Secondary | ICD-10-CM | POA: Diagnosis not present

## 2015-06-11 DIAGNOSIS — Z8546 Personal history of malignant neoplasm of prostate: Secondary | ICD-10-CM

## 2015-06-11 DIAGNOSIS — C8338 Diffuse large B-cell lymphoma, lymph nodes of multiple sites: Secondary | ICD-10-CM

## 2015-06-11 DIAGNOSIS — C8590 Non-Hodgkin lymphoma, unspecified, unspecified site: Secondary | ICD-10-CM

## 2015-06-11 NOTE — Telephone Encounter (Signed)
Gave adn printed appt sched and avs for pt for Aug and SEpt  °
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
## 2015-06-12 ENCOUNTER — Other Ambulatory Visit (HOSPITAL_BASED_OUTPATIENT_CLINIC_OR_DEPARTMENT_OTHER): Payer: Medicare PPO

## 2015-06-12 ENCOUNTER — Ambulatory Visit
Admit: 2015-06-12 | Discharge: 2015-06-12 | Disposition: A | Discharge status: Home / Self Care | Payer: Medicare PPO | Attending: Radiation Oncology | Admitting: Radiation Oncology

## 2015-06-12 ENCOUNTER — Ambulatory Visit: Payer: Medicare PPO

## 2015-06-12 DIAGNOSIS — Z510 Encounter for antineoplastic radiation therapy: Secondary | ICD-10-CM | POA: Diagnosis not present

## 2015-06-12 DIAGNOSIS — C8590 Non-Hodgkin lymphoma, unspecified, unspecified site: Secondary | ICD-10-CM

## 2015-06-12 DIAGNOSIS — C8338 Diffuse large B-cell lymphoma, lymph nodes of multiple sites: Secondary | ICD-10-CM

## 2015-06-12 DIAGNOSIS — C8330 Diffuse large B-cell lymphoma, unspecified site: Secondary | ICD-10-CM

## 2015-06-12 LAB — CBC WITH DIFFERENTIAL/PLATELET
BASO%: 0.5 % (ref 0.0–2.0)
Basophils Absolute: 0 10*3/uL (ref 0.0–0.1)
EOS ABS: 0.1 10*3/uL (ref 0.0–0.5)
EOS%: 0.9 % (ref 0.0–7.0)
HCT: 26.8 % — ABNORMAL LOW (ref 38.4–49.9)
HEMOGLOBIN: 8.7 g/dL — AB (ref 13.0–17.1)
LYMPH%: 6.9 % — AB (ref 14.0–49.0)
MCH: 29.9 pg (ref 27.2–33.4)
MCHC: 32.3 g/dL (ref 32.0–36.0)
MCV: 92.6 fL (ref 79.3–98.0)
MONO#: 0.7 10*3/uL (ref 0.1–0.9)
MONO%: 11.4 % (ref 0.0–14.0)
NEUT%: 80.3 % — ABNORMAL HIGH (ref 39.0–75.0)
NEUTROS ABS: 5.2 10*3/uL (ref 1.5–6.5)
Platelets: 306 10*3/uL (ref 140–400)
RBC: 2.9 10*6/uL — AB (ref 4.20–5.82)
RDW: 17.4 % — AB (ref 11.0–14.6)
WBC: 6.4 10*3/uL (ref 4.0–10.3)
lymph#: 0.4 10*3/uL — ABNORMAL LOW (ref 0.9–3.3)

## 2015-06-12 LAB — HOLD TUBE, BLOOD BANK

## 2015-06-13 ENCOUNTER — Encounter: Payer: Self-pay | Admitting: Radiation Oncology

## 2015-06-13 ENCOUNTER — Ambulatory Visit: Payer: Medicare PPO

## 2015-06-13 ENCOUNTER — Ambulatory Visit
Admission: RE | Admit: 2015-06-13 | Discharge: 2015-06-13 | Disposition: A | Discharge status: Home / Self Care | Payer: Medicare PPO | Source: Ambulatory Visit | Attending: Radiation Oncology | Admitting: Radiation Oncology

## 2015-06-13 ENCOUNTER — Other Ambulatory Visit: Payer: Medicare PPO

## 2015-06-13 DIAGNOSIS — Z510 Encounter for antineoplastic radiation therapy: Secondary | ICD-10-CM | POA: Diagnosis not present

## 2015-06-13 NOTE — Progress Notes (Signed)
[facility] End of Treatment Note  Name:Nicholas Olsen  Date: 06/13/2015 EYE:[id number] DOB:[birth year]   Status:outpatient    CC: Zigmund Gottron, MD  Dr. Julieanne Manson  REFERRING PHYSICIAN: Dr. Julieanne Manson    DIAGNOSIS: Non-Hodgkin's lymphoma involving the stomach, recurrent   INDICATION FOR TREATMENT: Palliative   TREATMENT DATES: 05/31/2015 through 06/13/2015                          SITE/DOSE:  Stomach 2000 cGy in 10 sessions                          BEAMS/ENERGY:    6 field technique with mixed 10 MV/15 MV photons               NARRATIVE:   He tolerated treatment well with no further melanoma during his course of therapy.  However, his wife states that he did have some bright red blood per rectum yesterday evening.  I do not believe that this would be secondary to hemorrhage from his stomach.                        PLAN: Routine followup in 2 weeks.  Patient instructed to call if questions or worsening complaints in interim.  He is a candidate for a further 2 weeks of radiation therapy to his stomach to provide durable palliation if the patient is so inclined after a two-week rest.

## 2015-06-14 ENCOUNTER — Encounter: Payer: Self-pay | Admitting: Family Medicine

## 2015-06-14 ENCOUNTER — Ambulatory Visit: Payer: Medicare PPO

## 2015-06-14 ENCOUNTER — Ambulatory Visit (INDEPENDENT_AMBULATORY_CARE_PROVIDER_SITE_OTHER): Payer: Medicare PPO | Admitting: Family Medicine

## 2015-06-14 VITALS — BP 115/72 | HR 61 | Temp 97.6°F | Ht 74.0 in | Wt 168.8 lb

## 2015-06-14 DIAGNOSIS — E440 Moderate protein-calorie malnutrition: Secondary | ICD-10-CM | POA: Diagnosis not present

## 2015-06-14 DIAGNOSIS — B356 Tinea cruris: Secondary | ICD-10-CM

## 2015-06-14 DIAGNOSIS — Z23 Encounter for immunization: Secondary | ICD-10-CM

## 2015-06-14 DIAGNOSIS — F0150 Vascular dementia without behavioral disturbance: Secondary | ICD-10-CM

## 2015-06-14 DIAGNOSIS — E1159 Type 2 diabetes mellitus with other circulatory complications: Secondary | ICD-10-CM

## 2015-06-14 DIAGNOSIS — E1151 Type 2 diabetes mellitus with diabetic peripheral angiopathy without gangrene: Secondary | ICD-10-CM

## 2015-06-14 DIAGNOSIS — K922 Gastrointestinal hemorrhage, unspecified: Secondary | ICD-10-CM

## 2015-06-14 DIAGNOSIS — R451 Restlessness and agitation: Secondary | ICD-10-CM

## 2015-06-14 DIAGNOSIS — C8590 Non-Hodgkin lymphoma, unspecified, unspecified site: Secondary | ICD-10-CM

## 2015-06-14 LAB — COMPREHENSIVE METABOLIC PANEL
ALK PHOS: 64 U/L (ref 40–115)
ALT: 11 U/L (ref 9–46)
AST: 21 U/L (ref 10–35)
Albumin: 3 g/dL — ABNORMAL LOW (ref 3.6–5.1)
BUN: 7 mg/dL (ref 7–25)
CO2: 27 mmol/L (ref 20–31)
Calcium: 9.1 mg/dL (ref 8.6–10.3)
Chloride: 100 mmol/L (ref 98–110)
Creat: 0.73 mg/dL (ref 0.70–1.18)
GLUCOSE: 78 mg/dL (ref 65–99)
POTASSIUM: 4.5 mmol/L (ref 3.5–5.3)
Sodium: 139 mmol/L (ref 135–146)
Total Bilirubin: 0.3 mg/dL (ref 0.2–1.2)
Total Protein: 6.4 g/dL (ref 6.1–8.1)

## 2015-06-14 LAB — POCT GLYCOSYLATED HEMOGLOBIN (HGB A1C): HEMOGLOBIN A1C: 5.6

## 2015-06-14 LAB — MAGNESIUM: MAGNESIUM: 1.7 mg/dL (ref 1.5–2.5)

## 2015-06-14 MED ORDER — HALOPERIDOL 0.5 MG PO TABS: 0.5000 mg | ORAL_TABLET | Freq: Four times a day (QID) | ORAL | Status: DC | PRN

## 2015-06-14 MED ORDER — CLOTRIMAZOLE-BETAMETHASONE 1-0.05 % EX CREA: 1.0000 "application " | TOPICAL_CREAM | Freq: Two times a day (BID) | CUTANEOUS | Status: DC

## 2015-06-14 NOTE — Assessment & Plan Note (Signed)
Added haldol

## 2015-06-14 NOTE — Assessment & Plan Note (Signed)
No overt bleeding and hgb stable.

## 2015-06-14 NOTE — Assessment & Plan Note (Signed)
He is dying from dementia, inanition, and recurrent lymphoma.  Switch to comfort care.  Simplify many meds

## 2015-06-14 NOTE — Patient Instructions (Addendum)
Stop the clonazepam Stop the paxil Stop the roxicet Stop the ceterizine Stop the ranitidine The A1C is fine.  Quit checking his blood sugar. I will call Tuesday with the blood test results. I may make more changes then. I will give you a new prescription for an as needed medication, haloperidol, for agitation.   If the physical therapy or occupational therapy upsets him, I will stop it. Please use knee high surgical compression stockings You got a flu shot today. Get hospice involved.

## 2015-06-14 NOTE — Progress Notes (Signed)
   Subjective:    Patient ID: Nicholas Olsen, male    DOB: 03-05-1934, 81 y.o.   MRN: [id number]  HPI Sadly, things continue to go downhill for Nicholas Olsen.  Fortunately, the palliative radiation seems to have stopped the stomach bleeding.  Hemoglobin 2 days ago holding steady.  He is not doing well with PT or OT.   Combative at times. Poor appetite Losing weight Ankle swelling is new.      Review of Systems     Objective:   Physical Exam Oriented x 0 Lungs clear Cardiac RRR without m or g Thin - a shadow of his former self.        Assessment & Plan:

## 2015-06-14 NOTE — Assessment & Plan Note (Addendum)
Rapidly progressive.  He is dying from dementia, inanition, and recurrent lymphoma.  Switch to comfort care.  Simplify many meds  Discontinue benzo and add haldol for aggitation.

## 2015-06-14 NOTE — Assessment & Plan Note (Signed)
Suspect ankle swelling is due to low serum proteins and maybe some high output failure with his anemia.

## 2015-06-21 ENCOUNTER — Telehealth: Payer: Self-pay | Admitting: *Deleted

## 2015-06-21 ENCOUNTER — Ambulatory Visit: Payer: Medicare PPO

## 2015-06-21 NOTE — Telephone Encounter (Signed)
Yvette with [facility] called stating the patient is being discharged from [facility] to hospice care next week.  Need confirmation that Dr. Andria Frames with be the attending physician and if he would like symptom management from the hospice doctors.  Please give them a call at [phone].  Derl Barrow, RN

## 2015-06-21 NOTE — Telephone Encounter (Signed)
[name] informed.

## 2015-06-27 ENCOUNTER — Encounter: Payer: Self-pay | Admitting: Radiation Oncology

## 2015-06-27 ENCOUNTER — Ambulatory Visit
Admission: RE | Admit: 2015-06-27 | Payer: No Typology Code available for payment source | Source: Ambulatory Visit | Admitting: Radiation Oncology

## 2015-06-27 HISTORY — DX: Personal history of irradiation: Z92.3

## 2015-07-11 ENCOUNTER — Telehealth: Payer: Self-pay | Admitting: Family Medicine

## 2015-07-11 NOTE — Telephone Encounter (Signed)
Nicholas Olsen, a Hospice nurse, states that pt's wife reported blood in pt's urine and he has hx of prostate cancer, they are concerned and would like to know what recommendations the provider has for him. Sadie Reynolds, ASA

## 2015-07-12 ENCOUNTER — Telehealth: Payer: Self-pay | Admitting: *Deleted

## 2015-07-12 ENCOUNTER — Other Ambulatory Visit: Payer: Medicare PPO

## 2015-07-12 ENCOUNTER — Ambulatory Visit: Payer: Medicare PPO | Admitting: Oncology

## 2015-07-12 NOTE — Telephone Encounter (Signed)
Spoke with [employer].  Apparently has had two episodes of gross hematuria noted by wife.  Jinny Blossom has not observed.  Dory has no fever, abd pain, urgency, frequency or dysuria.  Will observe for now.

## 2015-07-12 NOTE — Telephone Encounter (Signed)
VM from wife that Nicholas Olsen is under Hospice care now. Asking if he still needs to come to office for his appointments? Has appointment today and also 10/4 with Dr. Benay Spice. Forwarded message to MD

## 2015-07-12 NOTE — Telephone Encounter (Addendum)
Per Dr. Benay Spice: OK to cancel appointments. Called wife, informed her that hospice will keep Dr. Benay Spice informed of pt's status.

## 2015-07-16 ENCOUNTER — Ambulatory Visit: Payer: Medicare PPO | Admitting: Oncology

## 2015-07-19 ENCOUNTER — Other Ambulatory Visit: Payer: Self-pay | Admitting: *Deleted

## 2015-07-19 MED ORDER — DIVALPROEX SODIUM 125 MG PO CSDR: 500.0000 mg | DELAYED_RELEASE_CAPSULE | Freq: Every day | ORAL | Status: DC

## 2015-07-30 ENCOUNTER — Other Ambulatory Visit: Payer: Self-pay | Admitting: *Deleted

## 2015-07-31 MED ORDER — PANTOPRAZOLE SODIUM 40 MG PO TBEC: 40.0000 mg | DELAYED_RELEASE_TABLET | Freq: Every day | ORAL | Status: DC

## 2015-07-31 MED ORDER — PAROXETINE HCL 10 MG PO TABS: 10.0000 mg | ORAL_TABLET | Freq: Every day | ORAL | Status: DC

## 2015-08-15 ENCOUNTER — Other Ambulatory Visit: Payer: Self-pay | Admitting: Family Medicine

## 2015-09-10 ENCOUNTER — Telehealth: Payer: Self-pay | Admitting: *Deleted

## 2015-09-10 DIAGNOSIS — F0150 Vascular dementia without behavioral disturbance: Secondary | ICD-10-CM

## 2015-09-10 NOTE — Telephone Encounter (Signed)
Jinny Blossom, RN with [facility] left voice message on nurse line requesting a call back from PCP.  Patient's wife has concerns that patient has intermittent hematuria in condom cath and blood clots.  Patient is also not sleepy well at night and very agitated.  Ativan in not working and patient's wife is giving him Haldol 2 ML every 4 hours PRN.  The only other medication that patient is taking is the Inderal.  Please give her call at [phone].  Derl Barrow, RN

## 2015-09-11 MED ORDER — SENNOSIDES-DOCUSATE SODIUM 8.6-50 MG PO TABS: 1.0000 | ORAL_TABLET | Freq: Every day | ORAL | Status: AC

## 2015-09-11 MED ORDER — HALOPERIDOL LACTATE 2 MG/ML PO CONC: 5.0000 mg | ORAL | Status: AC | PRN

## 2015-09-11 NOTE — Telephone Encounter (Signed)
Spoke with [employer].  Cleaned med list.  Only taking haldol 2 mg q4h prn aggitation, ativan, senakot and propranolol. Told to stop ativan. Increase haloperidol to 5 mg q4h prn aggitation.

## 2015-11-05 ENCOUNTER — Ambulatory Visit: Payer: Medicare PPO | Admitting: Neurology

## 2015-11-06 ENCOUNTER — Encounter: Payer: Self-pay | Admitting: Neurology

## 2015-11-25 ENCOUNTER — Telehealth: Payer: Self-pay | Admitting: *Deleted

## 2015-11-25 NOTE — Telephone Encounter (Signed)
Pt is having intermittent episodes where he does not urinate for 24 hours and then when he does there is blood in his urine.  Megan the Hospice Nurse wants to let D.r Hensel know and see if he has any suggestions.  You can reach her at [phone].  Fleeger, Salome Spotted, CMA

## 2015-11-27 NOTE — Telephone Encounter (Signed)
Called and discussed.  Patient is wasting away with little PO intake.  He is not uncomfortable.  Nurse indicates bladder does not feel distended.  He is afebrile.  No orders for now.  I assume he is progressively becoming dehydrated and may be entering the final stages of dying.  Jinny Blossom knows to call back if symptoms change.

## 2015-12-30 ENCOUNTER — Telehealth: Payer: Self-pay | Admitting: *Deleted

## 2015-12-30 NOTE — Telephone Encounter (Signed)
Levada Dy from [facility] called to informed the provider that patient passed away earlier this morning 12/13/2015.  Please call with questions at [phone].  Derl Barrow, RN

## 2016-01-11 DEATH — deceased
